# Patient Record
Sex: Female | Born: 1950
Health system: Southern US, Community
[De-identification: ages and names within clinical notes are randomized; demographics above are authoritative.]

## PROBLEM LIST (undated history)

## (undated) DIAGNOSIS — E039 Hypothyroidism, unspecified: Secondary | ICD-10-CM

## (undated) DIAGNOSIS — J45909 Unspecified asthma, uncomplicated: Secondary | ICD-10-CM

## (undated) DIAGNOSIS — M1990 Unspecified osteoarthritis, unspecified site: Secondary | ICD-10-CM

## (undated) DIAGNOSIS — E079 Disorder of thyroid, unspecified: Secondary | ICD-10-CM

## (undated) DIAGNOSIS — K922 Gastrointestinal hemorrhage, unspecified: Secondary | ICD-10-CM

## (undated) DIAGNOSIS — R011 Cardiac murmur, unspecified: Secondary | ICD-10-CM

## (undated) DIAGNOSIS — T4145XA Adverse effect of unspecified anesthetic, initial encounter: Secondary | ICD-10-CM

## (undated) DIAGNOSIS — G43909 Migraine, unspecified, not intractable, without status migrainosus: Secondary | ICD-10-CM

## (undated) DIAGNOSIS — F1010 Alcohol abuse, uncomplicated: Secondary | ICD-10-CM

## (undated) DIAGNOSIS — D649 Anemia, unspecified: Secondary | ICD-10-CM

## (undated) DIAGNOSIS — I499 Cardiac arrhythmia, unspecified: Secondary | ICD-10-CM

## (undated) DIAGNOSIS — F32A Depression, unspecified: Secondary | ICD-10-CM

## (undated) DIAGNOSIS — F329 Major depressive disorder, single episode, unspecified: Secondary | ICD-10-CM

## (undated) DIAGNOSIS — T8859XA Other complications of anesthesia, initial encounter: Secondary | ICD-10-CM

## (undated) DIAGNOSIS — I2510 Atherosclerotic heart disease of native coronary artery without angina pectoris: Secondary | ICD-10-CM

## (undated) DIAGNOSIS — I10 Essential (primary) hypertension: Secondary | ICD-10-CM

## (undated) DIAGNOSIS — I209 Angina pectoris, unspecified: Secondary | ICD-10-CM

## (undated) DIAGNOSIS — F509 Eating disorder, unspecified: Secondary | ICD-10-CM

## (undated) HISTORY — PX: NECK SURGERY: SHX720

## (undated) HISTORY — PX: EYE SURGERY: SHX253

## (undated) HISTORY — DX: Gastrointestinal hemorrhage, unspecified: K92.2

## (undated) HISTORY — DX: Cardiac murmur, unspecified: R01.1

## (undated) HISTORY — DX: Alcohol abuse, uncomplicated: F10.10

## (undated) HISTORY — DX: Eating disorder, unspecified: F50.9

## (undated) HISTORY — PX: OTHER SURGICAL HISTORY: SHX169

## (undated) HISTORY — PX: SHOULDER SURGERY: SHX246

## (undated) HISTORY — PX: LEG SURGERY: SHX1003

## (undated) HISTORY — DX: Migraine, unspecified, not intractable, without status migrainosus: G43.909

---
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
## 2014-03-14 HISTORY — PX: BREAST SURGERY: SHX581

## 2017-09-05 ENCOUNTER — Emergency Department (HOSPITAL_BASED_OUTPATIENT_CLINIC_OR_DEPARTMENT_OTHER): Payer: Medicare Other

## 2017-09-05 ENCOUNTER — Emergency Department (HOSPITAL_COMMUNITY): Payer: Medicare Other

## 2017-09-05 ENCOUNTER — Emergency Department (HOSPITAL_COMMUNITY)
Admission: EM | Admit: 2017-09-05 | Discharge: 2017-09-05 | Disposition: A | Discharge status: Home / Self Care | Payer: Medicare Other | Attending: Emergency Medicine | Admitting: Emergency Medicine

## 2017-09-05 ENCOUNTER — Encounter (HOSPITAL_COMMUNITY): Payer: Self-pay

## 2017-09-05 DIAGNOSIS — M25572 Pain in left ankle and joints of left foot: Secondary | ICD-10-CM | POA: Diagnosis not present

## 2017-09-05 DIAGNOSIS — R609 Edema, unspecified: Secondary | ICD-10-CM | POA: Diagnosis not present

## 2017-09-05 DIAGNOSIS — E079 Disorder of thyroid, unspecified: Secondary | ICD-10-CM | POA: Insufficient documentation

## 2017-09-05 DIAGNOSIS — M79605 Pain in left leg: Secondary | ICD-10-CM | POA: Diagnosis present

## 2017-09-05 DIAGNOSIS — I10 Essential (primary) hypertension: Secondary | ICD-10-CM | POA: Insufficient documentation

## 2017-09-05 HISTORY — DX: Major depressive disorder, single episode, unspecified: F32.9

## 2017-09-05 HISTORY — DX: Essential (primary) hypertension: I10

## 2017-09-05 HISTORY — DX: Anemia, unspecified: D64.9

## 2017-09-05 HISTORY — DX: Depression, unspecified: F32.A

## 2017-09-05 HISTORY — DX: Disorder of thyroid, unspecified: E07.9

## 2017-09-05 MED ORDER — NAPROXEN 375 MG PO TABS: 375.0000 mg | ORAL_TABLET | Freq: Two times a day (BID) | ORAL | 0 refills | Status: DC

## 2017-09-05 NOTE — Discharge Instructions (Addendum)
You are seen in the emergency department today for pain and swelling to your left ankle.  The ultrasound we performed did not show a blood clot.  The x-rays were performed did not show a new broken bones or any other specific concerning findings.  At this time we suspect this may be an ankle sprain/strain, but are not 100% sure.  We are going to place you into an ankle brace.  Please wear this as needed for pain and swelling.  If your toes become numb, tingly, or blue, please remove the splint and return to the ER.  Apply ice wrapped in a towel 20 minutes on 40 minutes off for the next 48 hours.  Keep the leg elevated when seated.  We are also sending you home with naproxen for pain and swelling. Naproxen is a nonsteroidal anti-inflammatory medication that will help with pain and swelling. Be sure to take this medication as prescribed with food, 1 pill every 12 hours,  It should be taken with food, as it can cause stomach upset, and more seriously, stomach bleeding. Do not take other nonsteroidal anti-inflammatory medications with this such as Advil, Motrin, or Aleve.   We have prescribed you new medication(s) today. Discuss the medications prescribed today with your pharmacist as they can have adverse effects and interactions with your other medicines including over the counter and prescribed medications. Seek medical evaluation if you start to experience new or abnormal symptoms after taking one of these medicines, seek care immediately if you start to experience difficulty breathing, feeling of your throat closing, facial swelling, or rash as these could be indications of a more serious allergic reaction  You may take Tylenol with this medication safely.  Follow-up with the orthopedic doctor providing your discharge instructions within 1 week.  Return to the ER anytime for new or worsening symptoms including but not limited to fever, redness around the area, inability to walk, or any other concerns.

## 2017-09-05 NOTE — ED Provider Notes (Signed)
Patient placed in Quick Look pathway, seen and evaluated   Chief Complaint: Left leg pain  HPI:   Patient reports that she had a mechanical slip about 2 weeks ago at work and since then have had left leg pain and worsening swelling.  She has a history of crush injury to this leg with fasciotomy back in 2008.  In 2013 had foot reconstruction.   ROS: No fevers (one)  Physical Exam:   Gen: No distress  Neuro: Awake and Alert  Skin: Warm    Focused Exam: Left leg has swelling around ankle.  Is warm to touch.    Initiation of care has begun. The patient has been counseled on the process, plan, and necessity for staying for the completion/evaluation, and the remainder of the medical screening examination    Ollen Gross 09/05/17 1649    Hayden Rasmussen, MD 09/06/17 [phone]

## 2017-09-05 NOTE — ED Notes (Signed)
Ortho tech at bedside 

## 2017-09-05 NOTE — ED Notes (Signed)
Pt offered wheel chair for way out, pt declined

## 2017-09-05 NOTE — ED Triage Notes (Signed)
Pt presents with pain and swelling to L leg; reports slipping injury 2-3 weeks ago; pt reports L leg is warmer to touch than R leg.

## 2017-09-05 NOTE — Progress Notes (Signed)
Left lower extremity venous duplex has been completed. Negative for DVT.  Results were given to [facility] PA.  09/05/17 5:59 PM Carlos Levering RVT

## 2017-09-05 NOTE — ED Provider Notes (Signed)
[facility] EMERGENCY DEPARTMENT Provider Note   CSN: [id number] Arrival date & time: 09/05/17  1627     History   Chief Complaint Chief Complaint  Patient presents with  . Leg Pain    HPI Heather Christensen is a 67 y.o. female with a hx of HTN, depression, anemia, LLE crush injury with fasciotomy in 2008 and subsequent osteomyelitis and foot reconstruction in 2013 who presents to the ED with complaints of left lower extremity pain and swelling for the past 2.5 weeks.  Patient states that she was ambulating when she had a mechanical slip forward on the left foot.  She did not fall to the ground.  No head injury or loss of consciousness.  She states that shortly after this she developed pain and swelling into the left ankle and anterior lower leg.  She states she feels like swelling is worsening  She has been taking Aleve without significant change in her symptoms.   She states it feels somewhat warm to touch in these areas.  Given her extensive history she wanted to be evaluated, she states that she tried getting appointment with orthopedic doctor in the area as she just moved from [place], however they were not able to see her until next week.  Denies fever, chills, nausea, vomiting, or overlying redness.  She does have neurologic deficits secondary to her previous injuries which are unchanged.  HPI  Past Medical History:  Diagnosis Date  . Anemia   . Depression   . Hypertension   . Thyroid disease     There are no active problems to display for this patient.   Past Surgical History:  Procedure Laterality Date  . BRAIN SURGERY    . EYE SURGERY    . leg surgeries       OB History   None      Home Medications    Prior to Admission medications   Not on File    Family History History reviewed. No pertinent family history.  Social History Social History   Tobacco Use  . Smoking status: Never Smoker  . Smokeless tobacco: Never Used    Substance Use Topics  . Alcohol use: Not on file  . Drug use: Not on file     Allergies   Codeine and Sulfa antibiotics   Review of Systems Review of Systems  Constitutional: Negative for appetite change, chills and fever.  Cardiovascular: Positive for leg swelling.  Musculoskeletal: Positive for arthralgias (L ankle/lower leg pain).  Neurological:       Patient with decreased sensation to the lower extremity which is baseline, unchanged.  All other systems reviewed and are negative.  Physical Exam Updated Vital Signs BP (!) 152/100 (BP Location: Right Arm)   Pulse 76   Temp 98.9 F (37.2 C) (Oral)   Resp 18   Ht 5\' 1"  (1.549 m)   Wt 90.7 kg (200 lb)   SpO2 99%   BMI 37.79 kg/m   Physical Exam  Constitutional: She appears well-developed and well-nourished. No distress.  HENT:  Head: Normocephalic and atraumatic.  Eyes: Conjunctivae are normal. Right eye exhibits no discharge. Left eye exhibits no discharge.  Cardiovascular: Normal rate and regular rhythm.  Pulses:      Dorsalis pedis pulses are 2+ on the right side, and 2+ on the left side.       Posterior tibial pulses are 2+ on the right side, and 2+ on the left side.  Pulmonary/Chest: Effort normal  and breath sounds normal. No respiratory distress.  Musculoskeletal:  Patient with surgical changes to the left lower extremity as pictured below.  Patient does have 1+ pitting edema to the L foot and ankle extending to the distal aspects of the lower leg anteriorly, does not extend to the upper portion of the lower leg or the knee. No appreciable/significant warmth.  No overlying erythema.  No new open wounds.  Patient has normal range of motion to bilateral knees and ankles.  She is able to move all digits.  She is somewhat tender to palpation diffusely to the medial and lateral ankle as well as the anterior distal lower leg.  No point/focal areas of tenderness.  Compartments are soft.  Neurological: She is alert.   Sensation grossly intact bilateral lower extremities.  Patient has 5 out of 5 strength with plantar dorsiflexion bilaterally.  Gait intact.Clear speech.   Psychiatric: She has a normal mood and affect. Her behavior is normal. Thought content normal.  Nursing note and vitals reviewed.        ED Treatments / Results  Labs (all labs ordered are listed, but only abnormal results are displayed) Labs Reviewed - No data to display  EKG None  Radiology Dg Tibia/fibula Left  Result Date: 09/05/2017 CLINICAL DATA:  LEFT LOWER leg pain following injury 3 weeks ago. Initial encounter. History of remote tibial and fibular fractures. EXAM: LEFT TIBIA AND FIBULA - 2 VIEW COMPARISON:  None. FINDINGS: No acute fracture, subluxation or dislocation identified. Chronic posttraumatic changes of the proximal tibia and fibula noted. Degenerative changes within the MEDIAL and LATERAL compartments noted. No knee effusion. IMPRESSION: No acute bony abnormality Remote fractures of the proximal tibia and fibula. Electronically Signed   By: Margarette Canada M.D.   On: 09/05/2017 18:10   Dg Foot Complete Left  Result Date: 09/05/2017 CLINICAL DATA:  Acute LEFT foot pain following injury today. Initial encounter. EXAM: LEFT FOOT - COMPLETE 3+ VIEW COMPARISON:  None. FINDINGS: No acute fracture, subluxation or dislocation identified. Metallic screw and bony fusion across the big toe and tear phalangeal joint noted. A stable within the 1st metatarsal noted. Remote fractures of the 5th metatarsal and little toe proximal phalanx noted. The Lisfranc joints are unremarkable. Dorsal soft tissue swelling is present. IMPRESSION: Soft tissue swelling without acute bony abnormality. Electronically Signed   By: Margarette Canada M.D.   On: 09/05/2017 19:46    Procedures Procedures   (including critical care time)  Medications Ordered in ED Medications - No data to display   Initial Impression / Assessment and Plan / ED Course  I  have reviewed the triage vital signs and the nursing notes.  Pertinent labs & imaging results that were available during my care of the patient were reviewed by me and considered in my medical decision making (see chart for details).   Patient presents with left ankle/distal lower leg pain/swelling status post mechanical slip with this foot 2 weeks ago.  Patient nontoxic-appearing, no apparent distress, vitals WNL with the exception of elevated blood pressure, doubt HTN emergency, patient aware of need for recheck. On exam patient does have some edema and tenderness about the ankle and the anterior distal lower leg. No appreciable overlying erythema/warmth, patient afebrile, doubt infectious etiology such as cellulitis, septic joint, or osteomyelitis. DVT study per triage negative. X-rays obtained negative for acute fracture/dislocation. Possible ankle sprain given mechanism. NVI distally. Will place patient in ASO and provide naproxen with orthopedics follow up. I discussed results,  treatment plan, need for follow-up, and return precautions with the patient. Provided opportunity for questions, patient confirmed understanding and is in agreement with plan.   Findings and plan of care discussed with supervising physician Dr. Wilson Singer who is in agreement with plan.   Final Clinical Impressions(s) / ED Diagnoses   Final diagnoses:  Acute left ankle pain    ED Discharge Orders        Ordered    naproxen (NAPROSYN) 375 MG tablet  2 times daily     09/05/17 2026       Amaryllis Dyke, PA-C 09/05/17 2145    Virgel Manifold, MD 09/06/17 1248

## 2017-09-05 NOTE — ED Notes (Signed)
Ortho tech paged  

## 2017-09-05 NOTE — ED Notes (Signed)
ED Provider at bedside. 

## 2017-09-06 NOTE — Progress Notes (Signed)
Orthopedic Tech Progress Note Patient Details:  Heather Christensen [birth year] [id number]  Ortho Devices Type of Ortho Device: ASO Ortho Device/Splint Location: lle Ortho Device/Splint Interventions: Ordered, Application, Adjustment   Post Interventions Patient Tolerated: Well Instructions Provided: Care of device, Adjustment of device   [name] 09/06/2017, 12:39 AM

## 2017-10-31 NOTE — Pre-Procedure Instructions (Signed)
Heather Christensen [facility]  10/31/2017      [id number] [address] [place] [address] Phone: [phone] Fax: [phone]    Your procedure is scheduled on August 29  Report to [place] at [place].  Call this number if you have problems the morning of surgery:  [phone]   Remember:  Do not eat or drink after midnight.    Take these medicines the morning of surgery with A SIP OF WATER  Fluticasone-Salmeterol (ADVAIR levothyroxine (SYNTHROID, Levothroid loratadine (CLARITIN)  7 days prior to surgery STOP taking any Aspirin(unless otherwise instructed by your surgeon), Aleve, Naproxen, Ibuprofen, Motrin, Advil, Goody's, BC's, all herbal medications, fish oil, and all vitamins  Follow your surgeon's instructions on when to stop Asprin.  If no instructions were given by your surgeon then you will need to call the office to get those instructions.       Do not wear jewelry, make-up or nail polish.  Do not wear lotions, powders, or perfumes, or deodorant.  Do not shave 48 hours prior to surgery.    Do not bring valuables to the hospital.  [facility] is not responsible for any belongings or valuables.  Contacts, dentures or bridgework may not be worn into surgery.  Leave your suitcase in the car.  After surgery it may be brought to your room.  For patients admitted to the hospital, discharge time will be determined by your treatment team.  Patients discharged the day of surgery will not be allowed to drive home.    Special instructions:   [facility]- Preparing For Surgery  Before surgery, you can play an important role. Because skin is not sterile, your skin needs to be as free of germs as possible. You can reduce the number of germs on your skin by washing with CHG (chlorahexidine gluconate) Soap before surgery.  CHG is an antiseptic cleaner which kills germs and bonds with the skin to continue killing  germs even after washing.    Oral Hygiene is also important to reduce your risk of infection.  Remember - BRUSH YOUR TEETH THE MORNING OF SURGERY WITH YOUR REGULAR TOOTHPASTE  Please do not use if you have an allergy to CHG or antibacterial soaps. If your skin becomes reddened/irritated stop using the CHG.  Do not shave (including legs and underarms) for at least 48 hours prior to first CHG shower. It is OK to shave your face.  Please follow these instructions carefully.   1. Shower the NIGHT BEFORE SURGERY and the MORNING OF SURGERY with CHG.   2. If you chose to wash your hair, wash your hair first as usual with your normal shampoo.  3. After you shampoo, rinse your hair and body thoroughly to remove the shampoo.  4. Use CHG as you would any other liquid soap. You can apply CHG directly to the skin and wash gently with a scrungie or a clean washcloth.   5. Apply the CHG Soap to your body ONLY FROM THE NECK DOWN.  Do not use on open wounds or open sores. Avoid contact with your eyes, ears, mouth and genitals (private parts). Wash Face and genitals (private parts)  with your normal soap.  6. Wash thoroughly, paying special attention to the area where your surgery will be performed.  7. Thoroughly rinse your body with warm water from the neck down.  8. DO NOT shower/wash with your normal soap after using  and rinsing off the CHG Soap.  9. Pat yourself dry with a CLEAN TOWEL.  10. Wear CLEAN PAJAMAS to bed the night before surgery, wear comfortable clothes the morning of surgery  11. Place CLEAN SHEETS on your bed the night of your first shower and DO NOT SLEEP WITH PETS.    Day of Surgery:  Do not apply any deodorants/lotions.  Please wear clean clothes to the hospital/surgery center.   Remember to brush your teeth WITH YOUR REGULAR TOOTHPASTE.    Please read over the following fact sheets that you were given.

## 2017-11-01 ENCOUNTER — Encounter (HOSPITAL_COMMUNITY): Payer: Self-pay

## 2017-11-01 ENCOUNTER — Encounter (HOSPITAL_COMMUNITY)
Admission: RE | Admit: 2017-11-01 | Discharge: 2017-11-01 | Disposition: A | Discharge status: Home / Self Care | Payer: Medicare Other | Source: Ambulatory Visit | Attending: Orthopedic Surgery | Admitting: Orthopedic Surgery

## 2017-11-01 ENCOUNTER — Other Ambulatory Visit: Payer: Self-pay

## 2017-11-01 DIAGNOSIS — E039 Hypothyroidism, unspecified: Secondary | ICD-10-CM | POA: Diagnosis not present

## 2017-11-01 DIAGNOSIS — I10 Essential (primary) hypertension: Secondary | ICD-10-CM | POA: Diagnosis not present

## 2017-11-01 DIAGNOSIS — M13812 Other specified arthritis, left shoulder: Secondary | ICD-10-CM | POA: Insufficient documentation

## 2017-11-01 DIAGNOSIS — Z01812 Encounter for preprocedural laboratory examination: Secondary | ICD-10-CM | POA: Insufficient documentation

## 2017-11-01 DIAGNOSIS — Z79899 Other long term (current) drug therapy: Secondary | ICD-10-CM | POA: Diagnosis not present

## 2017-11-01 DIAGNOSIS — Z7982 Long term (current) use of aspirin: Secondary | ICD-10-CM | POA: Diagnosis not present

## 2017-11-01 DIAGNOSIS — Z79890 Hormone replacement therapy (postmenopausal): Secondary | ICD-10-CM | POA: Insufficient documentation

## 2017-11-01 HISTORY — DX: Adverse effect of unspecified anesthetic, initial encounter: T41.45XA

## 2017-11-01 HISTORY — DX: Unspecified asthma, uncomplicated: J45.909

## 2017-11-01 HISTORY — DX: Hypothyroidism, unspecified: E03.9

## 2017-11-01 HISTORY — DX: Other complications of anesthesia, initial encounter: T88.59XA

## 2017-11-01 HISTORY — DX: Unspecified osteoarthritis, unspecified site: M19.90

## 2017-11-01 HISTORY — DX: Cardiac arrhythmia, unspecified: I49.9

## 2017-11-01 LAB — CBC
HCT: 44.1 % (ref 36.0–46.0)
Hemoglobin: 14 g/dL (ref 12.0–15.0)
MCH: 30.8 pg (ref 26.0–34.0)
MCHC: 31.7 g/dL (ref 30.0–36.0)
MCV: 97.1 fL (ref 78.0–100.0)
PLATELETS: 250 10*3/uL (ref 150–400)
RBC: 4.54 MIL/uL (ref 3.87–5.11)
RDW: 12.6 % (ref 11.5–15.5)
WBC: 6 10*3/uL (ref 4.0–10.5)

## 2017-11-01 LAB — BASIC METABOLIC PANEL
Anion gap: 8 (ref 5–15)
BUN: 14 mg/dL (ref 8–23)
CALCIUM: 9.3 mg/dL (ref 8.9–10.3)
CHLORIDE: 106 mmol/L (ref 98–111)
CO2: 29 mmol/L (ref 22–32)
CREATININE: 1.08 mg/dL — AB (ref 0.44–1.00)
GFR calc non Af Amer: 52 mL/min — ABNORMAL LOW (ref >60)
GLUCOSE: 102 mg/dL — AB (ref 70–99)
Potassium: 4.7 mmol/L (ref 3.5–5.1)
Sodium: 143 mmol/L (ref 135–145)

## 2017-11-01 LAB — SURGICAL PCR SCREEN
MRSA, PCR: NEGATIVE
Staphylococcus aureus: NEGATIVE

## 2017-11-01 NOTE — Progress Notes (Signed)
PCP - Everardo Beals Cardiologist - denies  Chest x-ray - not needed EKG - requesting Stress Test - 2019 in Care everywhere ECHO - > 5 years Cardiac Cath - denies    Aspirin Instructions: stop 5 days  Anesthesia review: yes requesting cardiac records, hx PVC, SVT (hospital admission 03/2017)  Patient denies shortness of breath, fever, cough and chest pain at PAT appointment   Patient verbalized understanding of instructions that were given to them at the PAT appointment. Patient was also instructed that they will need to review over the PAT instructions again at home before surgery.

## 2017-11-02 ENCOUNTER — Encounter (HOSPITAL_COMMUNITY): Payer: Self-pay

## 2017-11-02 NOTE — Progress Notes (Addendum)
Anesthesia Chart Review:  Case:  [id number] Date/Time:  11/09/17 0715   Procedure:  REVERSE LEFT SHOULDER ARTHROPLASTY (Left Shoulder)   Anesthesia type:  General   Pre-op diagnosis:  Left shoulder arthropathy   Location:  [facility] OR ROOM 06 / [place] OR   Surgeon:  Justice Britain, MD      DISCUSSION: Patient is a 67 year old female scheduled for the above procedure.   History includes never smoker, HTN, dysrhythmia (palpitations, SVT, PVCs), pernicious anemia, asthma, hypothyroidism. BMI is consistent with obesity/borderline morbid obesity. She is a Actor. She recently moved to [place] from [place], [place].  She was evaluated by cardiology during 03/2017 hospitalization at [facility] for palpitations and recurrent chest pain and known remote history of SVT. EKG showed SR with PACs and blocked PACs. Troponins were negative. Stress test was non-ischemic. She was last seen by Dwana Curd, PA/Eduardo Lysle Morales, MD in [place], [place] on 04/20/17 for hospital follow-up (see [place]). Ziopatch ordered which showed 1% afib with 34 minute episode and multiple runs of SVT. I do not see any additional cardiology follow-up or recommendations after the [place].   Reviewed above with anesthesiologist Roberts Gaudy, MD. Based on Ziopatch results, patient felt to be at increased risk for perioperative afib. She is on ASA 81 mg. She is not on a b-blocker (HR 66 bpm at PAT) or anti-arrhythmic type medication. Would recommend preoperative cardiology clearance/recommendations. I spoke with patient and Claiborne Billings at Dr. Augustin Coupe' office. Patient denied any significant palpitations since ~ 04/2017. She was going through a stressful family situation at that time. She and Claiborne Billings will work on getting cardiology input. Chart will be left for follow-up.  ADDENDUM 11/07/17 12:35 PM: Patient was seen by Jeri Lager, NP at [facility] on 11/06/17. She apparently did not have  study results from [facility] at that time, so I called and reviewed results with Jeri Lager. (She has since received the ZioPatch results from [facility].) I also notified her that to my knowledge, patient did not have any echo at the time of her January 2019 evaluation. She reviewed with cardiologist Vernell Leep, MD. They felt patient was acceptable risk for surgery. No plans to start b-blocker at this time (HR 60's). If she develops perioperative arrhythmia, then b-blocker can be considered then as well as additional cardiology input. They will call patient to have her seen for out-patient follow-up after her surgery and will discuss consideration of anticoagulation therapy with her given Holter results.   If no acute changes then I anticipate that she can proceed as planned.   VS: BP (!) 166/61   Pulse 66   Temp 36.8 C   Resp 20   Ht 5\' 1"  (1.549 m)   Wt 95.9 kg   SpO2 96%   BMI 39.94 kg/m   PROVIDERS: Patient, No Pcp Per. She sees Everardo Beals, NP at [facility].   LABS: Labs reviewed: Acceptable for surgery. (all labs ordered are listed, but only abnormal results are displayed)  Labs Reviewed  BASIC METABOLIC PANEL - Abnormal; Notable for the following components:      Result Value   Glucose, Bld 102 (*)    Creatinine, Ser 1.08 (*)    GFR calc non Af Amer 52 (*)    All other components within normal limits  SURGICAL PCR SCREEN  CBC    IMAGES: PCXR 03/30/17 ([place]): FINDINGS  Portable AP view obtained. The soft and bony  tissues are unremarkable .  The heart and mediastinum are unremarkable .  No pulmonary pathology can be seen and specifically no evidence of infiltrates, adenopathy, effusions, or other acute cardiopulmonary abnormality is noted .  IMPRESSION 1. No significant radiographic pathology is evident . No change from prior study.   EKG: - 11/06/17 [facility]): NSR.  - 04/03/17 [facility]): Requested.  Per Care Everywhere Result Narrative: Normal sinus rhythm Normal ECG When compared with ECG of 30-Mar-2017 14:39, aberrant conduction is no longer present Confirmed by [place] No [phone]) on 04/03/2017 2:58:08 PM   CV: Ziopatch 03/31/17-04/10/17 [facility]): Final Interpretation: 1% atrial fibrillation with a 34-minute episode. Multiple short runs of SVT. Over 200 patient triggered episodes-most recorded normal sinus rhythm with premature beats-if you occurred with SVT or PAF.  Nuclear stress test 03/31/17 ([place]): PERFUSION: SPECT images demonstrate homogeneous tracer distribution throughout the myocardium. With no evidence of ischemia. No scintigraphic evidence of scar. FUNCTIONAL RESULTS (calculated via Gated SPECT) Stress Image LV EF: 88 %   Past Medical History:  Diagnosis Date  . Anemia    pernicious anemia  . Arthritis   . Asthma    seasonal, advair inhaler  . Complication of anesthesia    slow to wake  . Depression   . Dysrhythmia    palpitations, PVCs, SVT  . Hypertension   . Hypothyroidism   . Thyroid disease     Past Surgical History:  Procedure Laterality Date  . BREAST SURGERY Right    lumpectomy - begin  . EYE SURGERY     cataracts  . leg surgeries     fell leg got caught in a step stool and the toilet in the bathroom and crushed leg  . NECK SURGERY     lypoma removed from left side up behind ear (size of hand) beign  . SHOULDER SURGERY Left    rotator cuff, ball broke   . TONSILLECTOMY      MEDICATIONS: . aspirin EC 81 MG tablet  . atorvastatin (LIPITOR) 10 MG tablet  . citalopram (CELEXA) 40 MG tablet  . Fluticasone-Salmeterol (ADVAIR) 250-50 MCG/DOSE AEPB  . gabapentin (NEURONTIN) 600 MG tablet  . levothyroxine (SYNTHROID, LEVOTHROID) 75 MCG tablet  . loratadine (CLARITIN) 10 MG tablet  . losartan (COZAAR) 100 MG tablet  . Magnesium 500 MG TABS  . MELATONIN PO  . Menthol, Topical Analgesic, (BIOFREEZE EX)  . Multiple  Vitamin (MULTIVITAMIN WITH MINERALS) TABS tablet  . naproxen (NAPROSYN) 375 MG tablet  . naproxen sodium (ALEVE) 220 MG tablet  . potassium gluconate (HM POTASSIUM) 595 (99 K) MG TABS tablet  . TURMERIC PO   No current facility-administered medications for this encounter.     George Hugh [facility] Stay Center/Anesthesiology Phone [phone] 11/02/2017 5:24 PM

## 2017-11-08 MED ORDER — TRANEXAMIC ACID 1000 MG/10ML IV SOLN
1000.0000 mg | INTRAVENOUS | Status: AC
  Administered 2017-11-09: 1000 mg via INTRAVENOUS
  Filled 2017-11-08: qty 1100

## 2017-11-08 NOTE — Anesthesia Preprocedure Evaluation (Addendum)
Anesthesia Evaluation  Patient identified by MRN, date of birth, ID band Patient awake    Reviewed: Allergy & Precautions, NPO status , Patient's Chart, lab work & pertinent test results  Airway Mallampati: I  TM Distance: >3 FB Neck ROM: Full    Dental  (+) Teeth Intact, Dental Advisory Given   Pulmonary asthma ,    breath sounds clear to auscultation       Cardiovascular hypertension, Pt. on medications + dysrhythmias Atrial Fibrillation  Rhythm:Regular Rate:Normal     Neuro/Psych negative neurological ROS     GI/Hepatic negative GI ROS, Neg liver ROS,   Endo/Other  Hypothyroidism   Renal/GU negative Renal ROS     Musculoskeletal   Abdominal   Peds  Hematology negative hematology ROS (+)   Anesthesia Other Findings   Reproductive/Obstetrics                           Lab Results  Component Value Date   WBC 6.0 11/01/2017   HGB 14.0 11/01/2017   HCT 44.1 11/01/2017   MCV 97.1 11/01/2017   PLT 250 11/01/2017   Lab Results  Component Value Date   CREATININE 1.08 (H) 11/01/2017   BUN 14 11/01/2017   NA 143 11/01/2017   K 4.7 11/01/2017   CL 106 11/01/2017   CO2 29 11/01/2017    Anesthesia Physical Anesthesia Plan  ASA: II  Anesthesia Plan: General   Post-op Pain Management:  Regional for Post-op pain   Induction: Intravenous  PONV Risk Score and Plan: 3 and Ondansetron, Dexamethasone and Treatment may vary due to age or medical condition  Airway Management Planned: Oral ETT  Additional Equipment:   Intra-op Plan:   Post-operative Plan: Extubation in OR  Informed Consent: I have reviewed the patients History and Physical, chart, labs and discussed the procedure including the risks, benefits and alternatives for the proposed anesthesia with the patient or authorized representative who has indicated his/her understanding and acceptance.   Dental advisory given  Plan  Discussed with: CRNA  Anesthesia Plan Comments:        Anesthesia Quick Evaluation

## 2017-11-09 ENCOUNTER — Encounter (HOSPITAL_COMMUNITY): Payer: Self-pay | Admitting: *Deleted

## 2017-11-09 ENCOUNTER — Inpatient Hospital Stay (HOSPITAL_COMMUNITY)
Admission: RE | Admit: 2017-11-09 | Discharge: 2017-11-10 | DRG: 483 | Disposition: A | Discharge status: Home / Self Care | Payer: Medicare Other | Attending: Orthopedic Surgery | Admitting: Orthopedic Surgery

## 2017-11-09 ENCOUNTER — Inpatient Hospital Stay (HOSPITAL_COMMUNITY): Payer: Medicare Other | Admitting: Vascular Surgery

## 2017-11-09 ENCOUNTER — Inpatient Hospital Stay (HOSPITAL_COMMUNITY): Payer: Medicare Other | Admitting: Anesthesiology

## 2017-11-09 ENCOUNTER — Other Ambulatory Visit: Payer: Self-pay

## 2017-11-09 ENCOUNTER — Encounter (HOSPITAL_COMMUNITY)
Admission: RE | Disposition: A | Discharge status: Home / Self Care | Payer: Self-pay | Source: Home / Self Care | Attending: Orthopedic Surgery

## 2017-11-09 DIAGNOSIS — J45909 Unspecified asthma, uncomplicated: Secondary | ICD-10-CM | POA: Diagnosis present

## 2017-11-09 DIAGNOSIS — Z79899 Other long term (current) drug therapy: Secondary | ICD-10-CM

## 2017-11-09 DIAGNOSIS — M19112 Post-traumatic osteoarthritis, left shoulder: Principal | ICD-10-CM | POA: Diagnosis present

## 2017-11-09 DIAGNOSIS — F329 Major depressive disorder, single episode, unspecified: Secondary | ICD-10-CM | POA: Diagnosis present

## 2017-11-09 DIAGNOSIS — S42292S Other displaced fracture of upper end of left humerus, sequela: Secondary | ICD-10-CM

## 2017-11-09 DIAGNOSIS — Z79890 Hormone replacement therapy (postmenopausal): Secondary | ICD-10-CM

## 2017-11-09 DIAGNOSIS — I10 Essential (primary) hypertension: Secondary | ICD-10-CM | POA: Diagnosis present

## 2017-11-09 DIAGNOSIS — M25512 Pain in left shoulder: Secondary | ICD-10-CM | POA: Diagnosis present

## 2017-11-09 DIAGNOSIS — E039 Hypothyroidism, unspecified: Secondary | ICD-10-CM | POA: Diagnosis present

## 2017-11-09 DIAGNOSIS — Z96619 Presence of unspecified artificial shoulder joint: Secondary | ICD-10-CM

## 2017-11-09 DIAGNOSIS — Z7982 Long term (current) use of aspirin: Secondary | ICD-10-CM | POA: Diagnosis not present

## 2017-11-09 HISTORY — PX: REVERSE SHOULDER ARTHROPLASTY: SHX5054

## 2017-11-09 LAB — GLUCOSE, CAPILLARY: GLUCOSE-CAPILLARY: 196 mg/dL — AB (ref 70–99)

## 2017-11-09 SURGERY — ARTHROPLASTY, SHOULDER, TOTAL, REVERSE
Anesthesia: General | Site: Shoulder | Laterality: Left

## 2017-11-09 MED ORDER — ONDANSETRON HCL 4 MG PO TABS: 4.0000 mg | ORAL_TABLET | Freq: Four times a day (QID) | ORAL | Status: DC | PRN

## 2017-11-09 MED ORDER — DEXAMETHASONE SODIUM PHOSPHATE 10 MG/ML IJ SOLN
INTRAMUSCULAR | Status: AC
  Filled 2017-11-09: qty 1

## 2017-11-09 MED ORDER — ONDANSETRON HCL 4 MG/2ML IJ SOLN
INTRAMUSCULAR | Status: DC | PRN
  Administered 2017-11-09: 4 mg via INTRAVENOUS

## 2017-11-09 MED ORDER — SUGAMMADEX SODIUM 200 MG/2ML IV SOLN
INTRAVENOUS | Status: DC | PRN
  Administered 2017-11-09: 200 mg via INTRAVENOUS

## 2017-11-09 MED ORDER — KETOROLAC TROMETHAMINE 15 MG/ML IJ SOLN
7.5000 mg | Freq: Four times a day (QID) | INTRAMUSCULAR | Status: AC
  Administered 2017-11-09 – 2017-11-10 (×4): 7.5 mg via INTRAVENOUS
  Filled 2017-11-09 (×4): qty 1

## 2017-11-09 MED ORDER — MIDAZOLAM HCL 5 MG/5ML IJ SOLN
INTRAMUSCULAR | Status: DC | PRN
  Administered 2017-11-09: 1 mg via INTRAVENOUS

## 2017-11-09 MED ORDER — LEVOTHYROXINE SODIUM 75 MCG PO TABS
75.0000 ug | ORAL_TABLET | Freq: Every day | ORAL | Status: DC
  Administered 2017-11-10: 75 ug via ORAL
  Filled 2017-11-09: qty 1

## 2017-11-09 MED ORDER — FENTANYL CITRATE (PF) 100 MCG/2ML IJ SOLN: 25.0000 ug | INTRAMUSCULAR | Status: DC | PRN

## 2017-11-09 MED ORDER — PROPOFOL 10 MG/ML IV BOLUS
INTRAVENOUS | Status: AC
  Filled 2017-11-09: qty 20

## 2017-11-09 MED ORDER — CEFAZOLIN SODIUM-DEXTROSE 2-4 GM/100ML-% IV SOLN
2.0000 g | INTRAVENOUS | Status: AC
  Administered 2017-11-09: 2 g via INTRAVENOUS
  Filled 2017-11-09: qty 100

## 2017-11-09 MED ORDER — MIDAZOLAM HCL 2 MG/2ML IJ SOLN
INTRAMUSCULAR | Status: AC
  Filled 2017-11-09: qty 2

## 2017-11-09 MED ORDER — ROCURONIUM BROMIDE 50 MG/5ML IV SOSY
PREFILLED_SYRINGE | INTRAVENOUS | Status: DC | PRN
  Administered 2017-11-09: 50 mg via INTRAVENOUS

## 2017-11-09 MED ORDER — ASPIRIN EC 81 MG PO TBEC
81.0000 mg | DELAYED_RELEASE_TABLET | Freq: Two times a day (BID) | ORAL | Status: DC
  Administered 2017-11-09 – 2017-11-10 (×3): 81 mg via ORAL
  Filled 2017-11-09 (×3): qty 1

## 2017-11-09 MED ORDER — POLYETHYLENE GLYCOL 3350 17 G PO PACK: 17.0000 g | PACK | Freq: Every day | ORAL | Status: DC | PRN

## 2017-11-09 MED ORDER — MOMETASONE FURO-FORMOTEROL FUM 200-5 MCG/ACT IN AERO
2.0000 | INHALATION_SPRAY | Freq: Two times a day (BID) | RESPIRATORY_TRACT | Status: DC
  Administered 2017-11-09 – 2017-11-10 (×2): 2 via RESPIRATORY_TRACT
  Filled 2017-11-09: qty 8.8

## 2017-11-09 MED ORDER — METHOCARBAMOL 1000 MG/10ML IJ SOLN
500.0000 mg | Freq: Four times a day (QID) | INTRAVENOUS | Status: DC | PRN
  Filled 2017-11-09: qty 5

## 2017-11-09 MED ORDER — EPHEDRINE SULFATE-NACL 50-0.9 MG/10ML-% IV SOSY
PREFILLED_SYRINGE | INTRAVENOUS | Status: DC | PRN
  Administered 2017-11-09: 10 mg via INTRAVENOUS

## 2017-11-09 MED ORDER — CITALOPRAM HYDROBROMIDE 40 MG PO TABS
40.0000 mg | ORAL_TABLET | Freq: Every day | ORAL | Status: DC
  Administered 2017-11-09: 40 mg via ORAL
  Filled 2017-11-09: qty 1

## 2017-11-09 MED ORDER — ONDANSETRON HCL 4 MG/2ML IJ SOLN: 4.0000 mg | Freq: Four times a day (QID) | INTRAMUSCULAR | Status: DC | PRN

## 2017-11-09 MED ORDER — 0.9 % SODIUM CHLORIDE (POUR BTL) OPTIME
TOPICAL | Status: DC | PRN
  Administered 2017-11-09: 1000 mL

## 2017-11-09 MED ORDER — MAGNESIUM CITRATE PO SOLN: 1.0000 | Freq: Once | ORAL | Status: DC | PRN

## 2017-11-09 MED ORDER — ROCURONIUM BROMIDE 50 MG/5ML IV SOSY
PREFILLED_SYRINGE | INTRAVENOUS | Status: AC
  Filled 2017-11-09: qty 5

## 2017-11-09 MED ORDER — BUPIVACAINE LIPOSOME 1.3 % IJ SUSP
INTRAMUSCULAR | Status: DC | PRN
  Administered 2017-11-09: 10 mL via PERINEURAL

## 2017-11-09 MED ORDER — METHOCARBAMOL 500 MG PO TABS
500.0000 mg | ORAL_TABLET | Freq: Four times a day (QID) | ORAL | Status: DC | PRN
  Administered 2017-11-09: 500 mg via ORAL
  Filled 2017-11-09: qty 1

## 2017-11-09 MED ORDER — ACETAMINOPHEN 325 MG PO TABS: 325.0000 mg | ORAL_TABLET | Freq: Four times a day (QID) | ORAL | Status: DC | PRN

## 2017-11-09 MED ORDER — DIPHENHYDRAMINE HCL 12.5 MG/5ML PO ELIX: 12.5000 mg | ORAL_SOLUTION | ORAL | Status: DC | PRN

## 2017-11-09 MED ORDER — PHENYLEPHRINE 40 MCG/ML (10ML) SYRINGE FOR IV PUSH (FOR BLOOD PRESSURE SUPPORT)
PREFILLED_SYRINGE | INTRAVENOUS | Status: DC | PRN
  Administered 2017-11-09: 80 ug via INTRAVENOUS

## 2017-11-09 MED ORDER — FENTANYL CITRATE (PF) 100 MCG/2ML IJ SOLN
INTRAMUSCULAR | Status: DC | PRN
  Administered 2017-11-09: 50 ug via INTRAVENOUS

## 2017-11-09 MED ORDER — PHENOL 1.4 % MT LIQD: 1.0000 | OROMUCOSAL | Status: DC | PRN

## 2017-11-09 MED ORDER — SODIUM CHLORIDE 0.9 % IV SOLN
INTRAVENOUS | Status: DC | PRN
  Administered 2017-11-09: 50 ug/min via INTRAVENOUS

## 2017-11-09 MED ORDER — BISACODYL 5 MG PO TBEC: 5.0000 mg | DELAYED_RELEASE_TABLET | Freq: Every day | ORAL | Status: DC | PRN

## 2017-11-09 MED ORDER — OXYCODONE HCL 5 MG PO TABS
5.0000 mg | ORAL_TABLET | ORAL | Status: DC | PRN
  Administered 2017-11-09: 5 mg via ORAL
  Filled 2017-11-09: qty 1

## 2017-11-09 MED ORDER — LOSARTAN POTASSIUM 50 MG PO TABS
100.0000 mg | ORAL_TABLET | Freq: Every day | ORAL | Status: DC
  Administered 2017-11-09: 100 mg via ORAL
  Filled 2017-11-09: qty 2

## 2017-11-09 MED ORDER — LACTATED RINGERS IV SOLN
INTRAVENOUS | Status: DC
  Administered 2017-11-09: 13:00:00 via INTRAVENOUS

## 2017-11-09 MED ORDER — CHLORHEXIDINE GLUCONATE 4 % EX LIQD: 60.0000 mL | Freq: Once | CUTANEOUS | Status: DC

## 2017-11-09 MED ORDER — LORATADINE 10 MG PO TABS: 10.0000 mg | ORAL_TABLET | Freq: Every day | ORAL | Status: DC | PRN

## 2017-11-09 MED ORDER — LACTATED RINGERS IV SOLN
INTRAVENOUS | Status: DC | PRN
  Administered 2017-11-09: 07:00:00 via INTRAVENOUS

## 2017-11-09 MED ORDER — EPHEDRINE 5 MG/ML INJ
INTRAVENOUS | Status: AC
  Filled 2017-11-09: qty 10

## 2017-11-09 MED ORDER — ONDANSETRON HCL 4 MG/2ML IJ SOLN
INTRAMUSCULAR | Status: AC
  Filled 2017-11-09: qty 2

## 2017-11-09 MED ORDER — DOCUSATE SODIUM 100 MG PO CAPS
100.0000 mg | ORAL_CAPSULE | Freq: Two times a day (BID) | ORAL | Status: DC
  Administered 2017-11-09 – 2017-11-10 (×3): 100 mg via ORAL
  Filled 2017-11-09 (×3): qty 1

## 2017-11-09 MED ORDER — PHENYLEPHRINE 40 MCG/ML (10ML) SYRINGE FOR IV PUSH (FOR BLOOD PRESSURE SUPPORT)
PREFILLED_SYRINGE | INTRAVENOUS | Status: AC
  Filled 2017-11-09: qty 10

## 2017-11-09 MED ORDER — ALUM & MAG HYDROXIDE-SIMETH 200-200-20 MG/5ML PO SUSP: 30.0000 mL | ORAL | Status: DC | PRN

## 2017-11-09 MED ORDER — OXYCODONE HCL 5 MG PO TABS: 10.0000 mg | ORAL_TABLET | ORAL | Status: DC | PRN

## 2017-11-09 MED ORDER — PROPOFOL 10 MG/ML IV BOLUS
INTRAVENOUS | Status: DC | PRN
  Administered 2017-11-09: 130 mg via INTRAVENOUS

## 2017-11-09 MED ORDER — ONDANSETRON HCL 4 MG/2ML IJ SOLN: 4.0000 mg | Freq: Once | INTRAMUSCULAR | Status: DC | PRN

## 2017-11-09 MED ORDER — MENTHOL 3 MG MT LOZG: 1.0000 | LOZENGE | OROMUCOSAL | Status: DC | PRN

## 2017-11-09 MED ORDER — DEXAMETHASONE SODIUM PHOSPHATE 10 MG/ML IJ SOLN
INTRAMUSCULAR | Status: DC | PRN
  Administered 2017-11-09: 10 mg via INTRAVENOUS

## 2017-11-09 MED ORDER — BUPIVACAINE HCL (PF) 0.5 % IJ SOLN
INTRAMUSCULAR | Status: DC | PRN
  Administered 2017-11-09: 15 mL via PERINEURAL

## 2017-11-09 MED ORDER — FENTANYL CITRATE (PF) 250 MCG/5ML IJ SOLN
INTRAMUSCULAR | Status: AC
  Filled 2017-11-09: qty 5

## 2017-11-09 MED ORDER — LIDOCAINE 2% (20 MG/ML) 5 ML SYRINGE
INTRAMUSCULAR | Status: AC
  Filled 2017-11-09: qty 5

## 2017-11-09 MED ORDER — HYDROMORPHONE HCL 1 MG/ML IJ SOLN: 0.5000 mg | INTRAMUSCULAR | Status: DC | PRN

## 2017-11-09 MED ORDER — METOCLOPRAMIDE HCL 5 MG PO TABS: 5.0000 mg | ORAL_TABLET | Freq: Three times a day (TID) | ORAL | Status: DC | PRN

## 2017-11-09 MED ORDER — ATORVASTATIN CALCIUM 10 MG PO TABS
10.0000 mg | ORAL_TABLET | Freq: Every day | ORAL | Status: DC
  Administered 2017-11-09: 10 mg via ORAL
  Filled 2017-11-09: qty 1

## 2017-11-09 MED ORDER — GABAPENTIN 600 MG PO TABS
600.0000 mg | ORAL_TABLET | Freq: Every day | ORAL | Status: DC
  Administered 2017-11-09: 600 mg via ORAL
  Filled 2017-11-09: qty 1

## 2017-11-09 MED ORDER — METOCLOPRAMIDE HCL 5 MG/ML IJ SOLN: 5.0000 mg | Freq: Three times a day (TID) | INTRAMUSCULAR | Status: DC | PRN

## 2017-11-09 SURGICAL SUPPLY — 54 items
BASEPLATE GLENOID SHLDR SM (Shoulder) ×2 IMPLANT
BLADE SAW SGTL 83.5X18.5 (BLADE) ×2 IMPLANT
COVER SURGICAL LIGHT HANDLE (MISCELLANEOUS) ×2 IMPLANT
CUP SUT UNIV REVERS 36+2 LEFT (Cup) ×2 IMPLANT
DERMABOND ADVANCED (GAUZE/BANDAGES/DRESSINGS) ×1
DERMABOND ADVANCED .7 DNX12 (GAUZE/BANDAGES/DRESSINGS) ×1 IMPLANT
DRAPE ORTHO SPLIT 77X108 STRL (DRAPES) ×2
DRAPE SURG 17X11 SM STRL (DRAPES) ×2 IMPLANT
DRAPE SURG ORHT 6 SPLT 77X108 (DRAPES) ×2 IMPLANT
DRAPE U-SHAPE 47X51 STRL (DRAPES) ×2 IMPLANT
DRSG AQUACEL AG ADV 3.5X10 (GAUZE/BANDAGES/DRESSINGS) ×2 IMPLANT
DURAPREP 26ML APPLICATOR (WOUND CARE) ×2 IMPLANT
ELECT BLADE 4.0 EZ CLEAN MEGAD (MISCELLANEOUS) ×2
ELECT CAUTERY BLADE 6.4 (BLADE) ×2 IMPLANT
ELECT REM PT RETURN 9FT ADLT (ELECTROSURGICAL) ×2
ELECTRODE BLDE 4.0 EZ CLN MEGD (MISCELLANEOUS) ×1 IMPLANT
ELECTRODE REM PT RTRN 9FT ADLT (ELECTROSURGICAL) ×1 IMPLANT
FACESHIELD WRAPAROUND (MASK) ×6 IMPLANT
GLENOSPHERE LATERAL 36MM+4 (Shoulder) ×2 IMPLANT
GLOVE BIO SURGEON STRL SZ7.5 (GLOVE) ×2 IMPLANT
GLOVE BIO SURGEON STRL SZ8 (GLOVE) ×2 IMPLANT
GLOVE EUDERMIC 7 POWDERFREE (GLOVE) ×2 IMPLANT
GLOVE SS BIOGEL STRL SZ 7.5 (GLOVE) ×1 IMPLANT
GLOVE SUPERSENSE BIOGEL SZ 7.5 (GLOVE) ×1
GOWN STRL REUS W/ TWL LRG LVL3 (GOWN DISPOSABLE) ×1 IMPLANT
GOWN STRL REUS W/ TWL XL LVL3 (GOWN DISPOSABLE) ×2 IMPLANT
GOWN STRL REUS W/TWL LRG LVL3 (GOWN DISPOSABLE) ×1
GOWN STRL REUS W/TWL XL LVL3 (GOWN DISPOSABLE) ×2
KIT BASIN OR (CUSTOM PROCEDURE TRAY) ×2 IMPLANT
KIT TURNOVER KIT B (KITS) ×2 IMPLANT
LINER HUMERAL 36 +3MM SM (Shoulder) ×2 IMPLANT
MANIFOLD NEPTUNE II (INSTRUMENTS) ×2 IMPLANT
NEEDLE TAPERED W/ NITINOL LOOP (MISCELLANEOUS) ×2 IMPLANT
NS IRRIG 1000ML POUR BTL (IV SOLUTION) ×2 IMPLANT
PACK SHOULDER (CUSTOM PROCEDURE TRAY) ×2 IMPLANT
PAD ARMBOARD 7.5X6 YLW CONV (MISCELLANEOUS) ×4 IMPLANT
RESTRAINT HEAD UNIVERSAL NS (MISCELLANEOUS) ×2 IMPLANT
SCREW CENTRAL NONLOCK 6.5X20MM (Shoulder) ×2 IMPLANT
SCREW LOCK GLENOID UNI PERI (Screw) ×2 IMPLANT
SCREW LOCK PERIPHERAL 30MM (Shoulder) ×2 IMPLANT
SET PIN UNIVERSAL REVERSE (SET/KITS/TRAYS/PACK) ×2 IMPLANT
SLING ARM FOAM STRAP LRG (SOFTGOODS) IMPLANT
SLING ARM IMMOBILIZER LRG (SOFTGOODS) ×2 IMPLANT
SPONGE LAP 18X18 X RAY DECT (DISPOSABLE) ×2 IMPLANT
SPONGE LAP 4X18 RFD (DISPOSABLE) ×2 IMPLANT
STEM HUMERAL MOD SZ 5 135 DEG (Stem) ×2 IMPLANT
SUT MNCRL AB 3-0 PS2 18 (SUTURE) ×2 IMPLANT
SUT MON AB 2-0 CT1 27 (SUTURE) ×2 IMPLANT
SUT VIC AB 1 CT1 27 (SUTURE) ×1
SUT VIC AB 1 CT1 27XBRD ANBCTR (SUTURE) ×1 IMPLANT
SUTURE TAPE 1.3 40 TPR END (SUTURE) ×1 IMPLANT
SUTURETAPE 1.3 40 TPR END (SUTURE) ×2
TOWEL OR 17X26 10 PK STRL BLUE (TOWEL DISPOSABLE) ×2 IMPLANT
WATER STERILE IRR 1000ML POUR (IV SOLUTION) ×2 IMPLANT

## 2017-11-09 NOTE — Transfer of Care (Signed)
Immediate Anesthesia Transfer of Care Note  Patient: Heather Christensen  Procedure(s) Performed: REVERSE LEFT SHOULDER ARTHROPLASTY (Left Shoulder)  Patient Location: PACU  Anesthesia Type:GA combined with regional for post-op pain  Level of Consciousness: awake, alert  and oriented  Airway & Oxygen Therapy: Patient Spontanous Breathing and Patient connected to nasal cannula oxygen  Post-op Assessment: Report given to RN, Post -op Vital signs reviewed and stable and Patient moving all extremities  Post vital signs: Reviewed and stable  Last Vitals:  Vitals Value Taken Time  BP 134/85 11/09/2017  9:37 AM  Temp    Pulse 75 11/09/2017  9:41 AM  Resp 23 11/09/2017  9:41 AM  SpO2 99 % 11/09/2017  9:41 AM  Vitals shown include unvalidated device data.  Last Pain:  Vitals:   11/09/17 0624  TempSrc:   PainSc: 6          Complications: No apparent anesthesia complications

## 2017-11-09 NOTE — Plan of Care (Signed)

## 2017-11-09 NOTE — Anesthesia Procedure Notes (Signed)
Anesthesia Regional Block: Interscalene brachial plexus block   Pre-Anesthetic Checklist: ,, timeout performed, Correct Patient, Correct Site, Correct Laterality, Correct Procedure, Correct Position, site marked, Risks and benefits discussed,  Surgical consent,  Pre-op evaluation,  At surgeon's request and post-op pain management  Laterality: Left  Prep: chloraprep       Needles:  Injection technique: Single-shot  Needle Type: Echogenic Stimulator Needle     Needle Length: 9cm  Needle Gauge: 21     Additional Needles:   Procedures:, nerve stimulator,,, ultrasound used (permanent image in chart),,,,   Nerve Stimulator or Paresthesia:  Response: deltoid, 0.5 mA,   Additional Responses:   Narrative:  Start time: 11/09/2017 7:05 AM End time: 11/09/2017 7:12 AM Injection made incrementally with aspirations every 5 mL.  Performed by: Personally  Anesthesiologist: Suzette Battiest, MD

## 2017-11-09 NOTE — Anesthesia Postprocedure Evaluation (Signed)
Anesthesia Post Note  Patient: Heather Christensen  Procedure(s) Performed: REVERSE LEFT SHOULDER ARTHROPLASTY (Left Shoulder)     Patient location during evaluation: PACU Anesthesia Type: General Level of consciousness: awake and alert Pain management: pain level controlled Vital Signs Assessment: post-procedure vital signs reviewed and stable Respiratory status: spontaneous breathing, nonlabored ventilation, respiratory function stable and patient connected to nasal cannula oxygen Cardiovascular status: blood pressure returned to baseline and stable Postop Assessment: no apparent nausea or vomiting Anesthetic complications: no    Last Vitals:  Vitals:   11/09/17 0937 11/09/17 1029  BP: 134/85 (!) 144/59  Pulse:  69  Resp:  16  Temp: 36.7 C 37 C  SpO2:  96%    Last Pain:  Vitals:   11/09/17 1029  TempSrc: Oral  PainSc:                  Tiajuana Amass

## 2017-11-09 NOTE — Op Note (Signed)
11/09/2017  9:26 AM  PATIENT:   Heather Christensen  67 y.o. female  PRE-OPERATIVE DIAGNOSIS:  Left shoulder post traumatic arthropathy  POST-OPERATIVE DIAGNOSIS: Same  PROCEDURE: Left shoulder reverse arthroplasty utilizing a press-fit size 5.5 Arthrex stem with a posterior offset metaphysis, a +3 polyethylene insert, and a 36/+4 glenosphere on a small baseplate.  SURGEON:  Marin Shutter M.D.  ASSISTANTS: Jenetta Loges, PA-C  ANESTHESIA:   General endotracheal with an interscalene block utilizing Exparel  EBL: 200 cc  SPECIMEN: None  Drains: None   PATIENT DISPOSITION:  PACU - hemodynamically stable.    PLAN OF CARE: Admit for overnight observation  Brief history:  Heather Christensen has been followed for chronic and progressively increasing left shoulder pain related to a previous proximal humeral fracture with subsequent malunion.  She did have a previous surgical procedure of which the details were unclear.  Her current examination shows profoundly restricted mobility with severe pain.  Her plain radiographs confirm marked varus collapse of the humeral head with complete loss of the joint space as well as subchondral sclerosis and peripheral osteophyte formation.  Additionally, the humeral head appears high riding with apparent loss of rotator cuff function.  She is brought to the operating this time for planned left shoulder reverse arthroplasty  Preoperatively I did counseled Heather Christensen regarding treatment options as well as the potential risks versus benefits thereof.  Possible surgical complications were reviewed including the potential for bleeding, infection, neurovascular injury, persistent pain, loss of motion, failure the implant, anesthetic complication, and possible need for additional surgery.  She understands and accepts and agrees with her planned procedure.  Procedure detail:  After undergoing routine preop evaluation patient received prophylactic  antibiotics and interscalene block with Exparel was established in the holding area by the anesthesia department.  Placed supine on the operative table underwent smooth induction of a general endotracheal anesthesia.  Placed in the beachchair position and appropriately padded and protected.  Left shoulder girdle region was sterilely prepped and draped in standard fashion.  Timeout was called.  An anterior deltopectoral approach left shoulder was made through a 10 cm incision.  Skin flaps were elevated dissection carried deeply and electrocautery was used for hemostasis.  The deltopectoral interval was then developed with the cephalic vein taken laterally with the deltoid.  The upper centimeter and half of the pectoralis major tendon was then tenotomized to enhance exposure the long head biceps tendon was then tenodesed at the upper border of the pectoralis major tendon and was then tenotomized and the proximal segment was then unroofed and excised.  Conjoined tendon was mobilized and retracted medially.  I performed a subscapularis peel allowing access to the humeral head which was severely deformed and the subscapularis showed profound scarring with no elasticity and so we did not feel that any attempted a subscapularis repair would be possible.  Performed a subperiosteal dissection of the capsule attachments from the anterior inferior and inferior margins of the humeral neck and also excised some remnants of rotator cuff tissue superiorly such that we could deliver the head through the wound and again showed marked deformity and degeneration.  Once the humeral head was completely delivered through the wound we then outlined the proposed humeral head resection with the extra medullary guide and the head was resected using an oscillating saw.  The osteophytes of the margin of the humeral neck were then removed with rondure.  A metal cap was then placed over the cut proximal humeral surface  and then we exposed the  glenoid with combination of Fukuda, pitchfork, inspecting retractors.  I performed a circumferential labral resection gaining complete visualization the periphery of the glenoid.  A guidepin was then placed into the central aspect of the glenoid favoring somewhat toward the inferior margin of the glenoid and this was introduced with a 10 degree inferior tilt and we then reamed to prepare the glenoid with both the central peripheral reamers gaining an excellent subchondral bony bed for fixation of our baseplate.  The baseplate was then impacted and our central lag screw was then placed followed by the inferior and superior locking screws all of which obtained excellent bony purchase and fixation.  And then performed peripheral reaming around the glenoid baseplate at this time we had attempted to place the glenosphere but there was insufficient soft tissue mobility so we turned attention back to the humeral metaphysis and performed hand reaming and then broaching to size 5.5 which had a very tight fit.  The metaphysis was then prepared.  Once we gained this additional working room we were able to go ahead and impacted our 36+4 glenosphere onto the baseplate then placed our trial implant and there was clearly inadequate space for appropriate soft tissue balance and so we returned to attention back to the humeral shaft performed additional broaching gaining the proceeding of the implants and again  The proximal metaphysis with the offset reamer and this point then we are able to achieve excellent soft tissue balance and reduction of the implant.  At this point then the trial was removed the final implant was assembled on the back table the 5.5 stem with the 36 metaphysis was then impacted into the humeral shaft obtaining excellent fit and fixation.  Prior reduction showed good soft tissue balance with the +3 poly-.  The final +3 polyethylene insert was then placed final reduction was then performed.  This showed  excellent soft tissue balance good shoulder motion good stability.  Wounds then copiously irrigated.  Final hemostasis was obtained.  We again confirmed that the subscapularis did not have any elasticity was completed scarified so we did not repair the subscap.  The deltopectoral interval was then reapproximated with a series of interrupted figure-of-eight #1 Vicryl sutures.  2-0 Monocryl used for the subcu layer intracuticular through Monocryl for the skin followed by Dermabond and Aquasol dressing left arm was then placed into a sling and the patient was awakened extubated and taken recovery room in stable condition  [facility], PA-C was used as an Environmental consultant throughout this case essential for help with positioning the patient, position extremity, tissue manipulation, implantation the prosthesis, wound closure, and intraoperative decision-making.  Marin Shutter MD    Contact # [phone]

## 2017-11-09 NOTE — H&P (Signed)
Heather Christensen    Chief Complaint: Left shoulder arthropathy HPI: The patient is a 67 y.o. female with end stage left shoulder post traumatic arthritis  Past Medical History:  Diagnosis Date  . Anemia    pernicious anemia  . Arthritis   . Asthma    seasonal, advair inhaler  . Complication of anesthesia    slow to wake  . Depression   . Dysrhythmia    palpitations, PVCs, SVT  . Hypertension   . Hypothyroidism   . Thyroid disease     Past Surgical History:  Procedure Laterality Date  . BREAST SURGERY Right    lumpectomy - begin  . EYE SURGERY     cataracts  . leg surgeries     fell leg got caught in a step stool and the toilet in the bathroom and crushed leg  . NECK SURGERY     lypoma removed from left side up behind ear (size of hand) beign  . SHOULDER SURGERY Left    rotator cuff, ball broke   . TONSILLECTOMY      History reviewed. No pertinent family history.  Social History:  reports that she has never smoked. She has never used smokeless tobacco. She reports that she has current or past drug history. She reports that she does not drink alcohol.   Medications Prior to Admission  Medication Sig Dispense Refill  . aspirin EC 81 MG tablet Take 81 mg by mouth 2 (two) times daily.    Marland Kitchen atorvastatin (LIPITOR) 10 MG tablet Take 10 mg by mouth at bedtime.    . citalopram (CELEXA) 40 MG tablet Take 40 mg by mouth at bedtime.    . gabapentin (NEURONTIN) 600 MG tablet Take 600 mg by mouth at bedtime.    Marland Kitchen levothyroxine (SYNTHROID, LEVOTHROID) 75 MCG tablet Take 75 mcg by mouth daily before breakfast.    . loratadine (CLARITIN) 10 MG tablet Take 10 mg by mouth daily as needed for allergies.    Marland Kitchen losartan (COZAAR) 100 MG tablet Take 100 mg by mouth at bedtime.    . Magnesium 500 MG TABS Take 500 mg by mouth daily.    Marland Kitchen MELATONIN PO Take 1 tablet by mouth at bedtime as needed (sleep).    . Menthol, Topical Analgesic, (BIOFREEZE EX) Apply 1 patch topically daily as  needed (pain).    . Multiple Vitamin (MULTIVITAMIN WITH MINERALS) TABS tablet Take 1 tablet by mouth daily.    . naproxen sodium (ALEVE) 220 MG tablet Take 440 mg by mouth 2 (two) times daily as needed (pain).    . potassium gluconate (HM POTASSIUM) 595 (99 K) MG TABS tablet Take 595 mg by mouth daily.    . TURMERIC PO Take 1 capsule by mouth daily.    . Fluticasone-Salmeterol (ADVAIR) 250-50 MCG/DOSE AEPB Inhale 1 puff into the lungs 2 (two) times daily as needed (shortness of breath).    . naproxen (NAPROSYN) 375 MG tablet Take 1 tablet (375 mg total) by mouth 2 (two) times daily. (Patient not taking: Reported on 10/26/2017) 20 tablet 0     Physical Exam: left shoulder with painful and restricted motion as noted at recent office visits  Vitals  Temp:  [98.3 F (36.8 C)] 98.3 F (36.8 C) (08/29 0603) Pulse Rate:  [70] 70 (08/29 0603) Resp:  [20] 20 (08/29 0603) BP: (153)/(67) 153/67 (08/29 0603) SpO2:  [96 %] 96 % (08/29 0603)  Assessment/Plan  Impression: Left shoulder arthropathy  Plan of Action:  Procedure(s): REVERSE LEFT SHOULDER ARTHROPLASTY  Jaramie Bastos [name] 11/09/2017, 7:26 AM Contact # [phone]

## 2017-11-09 NOTE — Plan of Care (Signed)
°  Problem: Elimination: °Goal: Will not experience complications related to bowel motility °Outcome: Progressing °  °Problem: Pain Managment: °Goal: General experience of comfort will improve °Outcome: Progressing °  °

## 2017-11-09 NOTE — Anesthesia Procedure Notes (Signed)
Procedure Name: Intubation Date/Time: 11/09/2017 7:38 AM Performed by: Kyung Rudd, CRNA Pre-anesthesia Checklist: Patient identified, Emergency Drugs available, Suction available, Patient being monitored and Timeout performed Patient Re-evaluated:Patient Re-evaluated prior to induction Oxygen Delivery Method: Circle system utilized Preoxygenation: Pre-oxygenation with 100% oxygen Induction Type: IV induction Ventilation: Mask ventilation without difficulty Laryngoscope Size: Mac and 3 Grade View: Grade I Tube type: Oral Tube size: 7.0 mm Number of attempts: 1 Airway Equipment and Method: Stylet Placement Confirmation: ETT inserted through vocal cords under direct vision,  positive ETCO2 and breath sounds checked- equal and bilateral Secured at: 21 cm Tube secured with: Tape Dental Injury: Teeth and Oropharynx as per pre-operative assessment

## 2017-11-09 NOTE — Discharge Instructions (Signed)
° °Kevin M. Supple, M.D., F.A.A.O.S. °Orthopaedic Surgery °Specializing in Arthroscopic and Reconstructive °Surgery of the Shoulder and Knee °[phone] °[address] - [place], [place] [address] - Fax [phone] ° ° °POST-OP TOTAL SHOULDER REPLACEMENT INSTRUCTIONS ° °1. Call the office at [phone] to schedule your first post-op appointment 10-14 days from the date of your surgery. ° °2. The bandage over your incision is waterproof. You may begin showering with this dressing on. You may leave this dressing on until first follow up appointment within 2 weeks. We prefer you leave this dressing in place until follow up however after 5-7 days if you are having itching or skin irritation and would like to remove it you may do so. Go slow and tug at the borders gently to break the bond the dressing has with the skin. At this point if there is no drainage it is okay to go without a bandage or you may cover it with a light guaze and tape. You can also expect significant bruising around your shoulder that will drift down your arm and into your chest wall. This is very normal and should resolve over several days. ° ° 3. Wear your sling/immobilizer at all times except to perform the exercises below or to occasionally let your arm dangle by your side to stretch your elbow. You also need to sleep in your sling immobilizer until instructed otherwise. ° °4. Range of motion to your elbow, wrist, and hand are encouraged 3-5 times daily. Exercise to your hand and fingers helps to reduce swelling you may experience. ° °5. Utilize ice to the shoulder 3-5 times minimum a day and additionally if you are experiencing pain. ° °6. Prescriptions for a pain medication and a muscle relaxant are provided for you. It is recommended that if you are experiencing pain that you pain medication alone is not controlling, add the muscle relaxant along with the pain medication which can give additional pain relief. The first 1-2 days  is generally the most severe of your pain and then should gradually decrease. As your pain lessens it is recommended that you decrease your use of the pain medications to an "as needed basis'" only and to always comply with the recommended dosages of the pain medications. ° °7. Pain medications can produce constipation along with their use. If you experience this, the use of an over the counter stool softener or laxative daily is recommended.  ° °8. For additional questions or concerns, please do not hesitate to call the office. If after hours there is an answering service to forward your concerns to the physician on call. ° °9.Pain control following an exparel block ° °To help control your post-operative pain you received a nerve block  performed with Exparel which is a long acting anesthetic (numbing agent) which can provide pain relief and sensations of numbness (and relief of pain) in the operative shoulder and arm for up to 3 days. Sometimes it provides mixed relief, meaning you may still have numbness in certain areas of the arm but can still be able to move  parts of that arm, hand, and fingers. We recommend that your prescribed pain medications  be used as needed. We do not feel it is necessary to "pre medicate" and "stay ahead" of pain.  Taking narcotic pain medications when you are not having any pain can lead to unnecessary and potentially dangerous side effects.  ° °POST-OP EXERCISES ° °Pendulum Exercises ° °Perform pendulum exercises while standing and bending at   the waist. Support your uninvolved arm on a table or chair and allow your operated arm to hang freely. Make sure to do these exercises passively - not using you shoulder muscles. ° °Repeat 20 times. Do 3 sessions per day. ° ° ° ° °

## 2017-11-10 ENCOUNTER — Encounter (HOSPITAL_COMMUNITY): Payer: Self-pay | Admitting: Orthopedic Surgery

## 2017-11-10 MED ORDER — METHOCARBAMOL 500 MG PO TABS: 500.0000 mg | ORAL_TABLET | Freq: Three times a day (TID) | ORAL | 1 refills | Status: DC | PRN

## 2017-11-10 MED ORDER — ONDANSETRON HCL 4 MG PO TABS: 4.0000 mg | ORAL_TABLET | Freq: Three times a day (TID) | ORAL | 0 refills | Status: DC | PRN

## 2017-11-10 MED ORDER — OXYCODONE-ACETAMINOPHEN 5-325 MG PO TABS: 1.0000 | ORAL_TABLET | ORAL | 0 refills | Status: DC | PRN

## 2017-11-10 MED ORDER — TRAMADOL HCL 50 MG PO TABS: 50.0000 mg | ORAL_TABLET | Freq: Four times a day (QID) | ORAL | 0 refills | Status: DC | PRN

## 2017-11-10 NOTE — Progress Notes (Signed)
Heather Christensen  MRN: [id number] DOB/Age: [birth year] 66 y.o. [facility] Procedure: Procedure(s) (LRB): REVERSE LEFT SHOULDER ARTHROPLASTY (Left)     Subjective: Doing well this am, block still working at surgical site. Medicated last night for discomfort in periscapular area  Vital Signs Temp:  [97.9 F (36.6 C)-98.6 F (37 C)] 98.2 F (36.8 C) (08/30 0520) Pulse Rate:  [68-86] 68 (08/30 0520) Resp:  [15-16] 15 (08/30 0520) BP: (130-144)/(59-85) 135/65 (08/30 0520) SpO2:  [95 %-99 %] 98 % (08/30 0520) Weight:  [95.9 kg] 95.9 kg (08/29 1029)  Lab Results No results for input(s): WBC, HGB, HCT, PLT in the last 72 hours. BMET No results for input(s): NA, K, CL, CO2, GLUCOSE, BUN, CREATININE, CALCIUM in the last 72 hours. No results found for: INR   Exam Dressing dry Moves hand but block still in effect proximally        Plan DC home after OT  [facility] PA-C  11/10/2017, 8:09 AM Contact # [phone]

## 2017-11-10 NOTE — Progress Notes (Signed)
Provided discharge education/instructions, all questions and concerns addressed, discharged home with belongings accompanied by family.

## 2017-11-10 NOTE — Discharge Summary (Signed)
PATIENT ID:      Heather Christensen  MRN:     [id number] DOB/AGE:    1950-07-31 / 67 y.o.     DISCHARGE SUMMARY  ADMISSION DATE:    11/09/2017 DISCHARGE DATE:    ADMISSION DIAGNOSIS: Left shoulder arthropathy Past Medical History:  Diagnosis Date  . Anemia    pernicious anemia  . Arthritis   . Asthma    seasonal, advair inhaler  . Complication of anesthesia    slow to wake  . Depression   . Dysrhythmia    palpitations, PVCs, SVT  . Hypertension   . Hypothyroidism   . Thyroid disease     DISCHARGE DIAGNOSIS:   Active Problems:   S/p reverse total shoulder arthroplasty   PROCEDURE: Procedure(s): REVERSE LEFT SHOULDER ARTHROPLASTY on 11/09/2017  CONSULTS:    HISTORY:  See H&P in chart.  HOSPITAL COURSE:  Heather Christensen is a 67 y.o. admitted on 11/09/2017 with a diagnosis of Left shoulder arthropathy.  They were brought to the operating room on 11/09/2017 and underwent Procedure(s): REVERSE LEFT SHOULDER ARTHROPLASTY.    They were given perioperative antibiotics:  Anti-infectives (From admission, onward)   Start     Dose/Rate Route Frequency Ordered Stop   11/09/17 0615  ceFAZolin (ANCEF) IVPB 2g/100 mL premix     2 g 200 mL/hr over 30 Minutes Intravenous On call to O.R. 11/09/17 [id number] 11/09/17 0750    .  Patient underwent the above named procedure and tolerated it well. The following day they were hemodynamically stable and pain was controlled on oral analgesics. They were neurovascularly intact to the operative extremity. OT was ordered and worked with patient per protocol. They were medically and orthopaedically stable for discharge on day 1 .     DIAGNOSTIC STUDIES:  RECENT RADIOGRAPHIC STUDIES :  No results found.  RECENT VITAL SIGNS:   Patient Vitals for the past 24 hrs:  BP Temp Temp src Pulse Resp SpO2 Height Weight  11/10/17 0520 135/65 98.2 F (36.8 C) Oral 68 15 98 % - -  11/10/17 0047 134/64 97.9 F (36.6 C) Oral 79 15 99 % - -  11/09/17 2033  140/60 98.2 F (36.8 C) Oral 86 15 96 % - -  11/09/17 1213 130/63 98 F (36.7 C) Oral 72 15 95 % - -  11/09/17 1029 (!) 144/59 98.6 F (37 C) Oral 69 16 96 % 5\' 1"  (1.549 m) 95.9 kg  11/09/17 0937 134/85 98 F (36.7 C) - - - - - -  .  RECENT EKG RESULTS:   No orders found for this or any previous visit.  DISCHARGE INSTRUCTIONS:    DISCHARGE MEDICATIONS:   Allergies as of 11/10/2017      Reactions   Sulfa Antibiotics Other (See Comments)   STEVENS JOHNSON   Sulfamethoxazole Other (See Comments)   STEVENS JOHNSON   Quinolones Swelling, Other (See Comments)   SWELLING REACTION UNSPECIFIED    Codeine Nausea And Vomiting      Medication List    STOP taking these medications   naproxen 375 MG tablet Commonly known as:  NAPROSYN     TAKE these medications   aspirin EC 81 MG tablet Take 81 mg by mouth 2 (two) times daily.   atorvastatin 10 MG tablet Commonly known as:  LIPITOR Take 10 mg by mouth at bedtime.   BIOFREEZE EX Apply 1 patch topically daily as needed (pain).   citalopram 40 MG tablet Commonly known as:  CELEXA  Take 40 mg by mouth at bedtime.   Fluticasone-Salmeterol 250-50 MCG/DOSE Aepb Commonly known as:  ADVAIR Inhale 1 puff into the lungs 2 (two) times daily as needed (shortness of breath).   gabapentin 600 MG tablet Commonly known as:  NEURONTIN Take 600 mg by mouth at bedtime.   HM POTASSIUM 595 (99 K) MG Tabs tablet Generic drug:  potassium gluconate Take 595 mg by mouth daily.   levothyroxine 75 MCG tablet Commonly known as:  SYNTHROID, LEVOTHROID Take 75 mcg by mouth daily before breakfast.   loratadine 10 MG tablet Commonly known as:  CLARITIN Take 10 mg by mouth daily as needed for allergies.   losartan 100 MG tablet Commonly known as:  COZAAR Take 100 mg by mouth at bedtime.   Magnesium 500 MG Tabs Take 500 mg by mouth daily.   MELATONIN PO Take 1 tablet by mouth at bedtime as needed (sleep).   methocarbamol 500 MG  tablet Commonly known as:  ROBAXIN Take 1 tablet (500 mg total) by mouth every 8 (eight) hours as needed for muscle spasms.   multivitamin with minerals Tabs tablet Take 1 tablet by mouth daily.   naproxen sodium 220 MG tablet Commonly known as:  ALEVE Take 440 mg by mouth 2 (two) times daily as needed (pain).   ondansetron 4 MG tablet Commonly known as:  ZOFRAN Take 1 tablet (4 mg total) by mouth every 8 (eight) hours as needed for nausea or vomiting.   oxyCODONE-acetaminophen 5-325 MG tablet Commonly known as:  PERCOCET/ROXICET Take 1 tablet by mouth every 4 (four) hours as needed for severe pain (max 6 q).   traMADol 50 MG tablet Commonly known as:  ULTRAM Take 1 tablet (50 mg total) by mouth every 6 (six) hours as needed for moderate pain.   TURMERIC PO Take 1 capsule by mouth daily.       FOLLOW UP VISIT:   Follow-up Information    Justice Britain, MD.   Specialty:  Orthopedic Surgery Why:  call to be seen in 10-14 days Contact information: [address] [place] [address] [phone]           DISCHARGE QH:UTML    DISCHARGE CONDITION:  Heather Christensen Heather Christensen for Dr. Justice Britain 11/10/2017, 8:08 AM

## 2017-11-10 NOTE — Plan of Care (Signed)
  Problem: Activity: Goal: Risk for activity intolerance will decrease Outcome: Progressing   Problem: Nutrition: Goal: Adequate nutrition will be maintained Outcome: Progressing   Problem: Elimination: Goal: Will not experience complications related to bowel motility Outcome: Progressing   Problem: Pain Managment: Goal: General experience of comfort will improve Outcome: Progressing   Problem: Skin Integrity: Goal: Risk for impaired skin integrity will decrease Outcome: Progressing   

## 2017-11-10 NOTE — Evaluation (Signed)
Occupational Therapy Evaluation Patient Details Name: Heather Christensen MRN: [id number] DOB: 03-May-1950 Today's Date: 11/10/2017    History of Present Illness Pt is as 67 y/o female s/p L shoulder reverse arthroplasty d/t end stage post traumatic arthritis. PMH significant for but not limited to: anemia, arthritis, asthma, depression, HTN, L shoulder rotator cuff surgery.    Clinical Impression   PTA pt was independent and working.  She was admitted for above dx and currently requires mod assist for UB dressing, min assist for UB bathing, min assist for toileting, min assist for LB dressing, and min assist for grooming, completed toilet transfers and bed mobility with independence.  She was educated on precautions, exercises, ADL compensatory techniques, safety, pain management, positioning, sling management, and mobility.  She has support of significant other at home, able to provide 24/7 assist as needed.  At this time, all OT needs have been met acutely.  Thank you for this referral!  OT signing OFF.     Follow Up Recommendations  Follow surgeon's recommendation for DC plan and follow-up therapies;Supervision - Intermittent    Equipment Recommendations  None recommended by OT    Recommendations for Other Services       Precautions / Restrictions Precautions Precautions: Shoulder Type of Shoulder Precautions: Active protocol: AROM elbow/wrist/hand, Shoulder PROM/AAROM/AROM ER 20, Abdcution 45, FE 60 Shoulder Interventions: Shoulder sling/immobilizer;At all times;Off for dressing/bathing/exercises Precaution Booklet Issued: Yes (comment) Precaution Comments: reviewed thoroughly with patient Required Braces or Orthoses: Sling Restrictions Weight Bearing Restrictions: Yes LUE Weight Bearing: Non weight bearing      Mobility Bed Mobility Overal bed mobility: Modified Independent                Transfers Overall transfer level: Independent Equipment used: None                   Balance Overall balance assessment: No apparent balance deficits (not formally assessed)                                         ADL either performed or assessed with clinical judgement   ADL Overall ADL's : Needs assistance/impaired Eating/Feeding: Set up;Sitting   Grooming: Set up;Sitting   Upper Body Bathing: Minimal assistance;Sitting;Cueing for compensatory techniques Upper Body Bathing Details (indicate cue type and reason): educated on techniques for bathing with shoulder precautions  Lower Body Bathing: Minimal assistance;Sit to/from stand Lower Body Bathing Details (indicate cue type and reason): requires assist to reach L hip area, will have assist at home  Upper Body Dressing : Moderate assistance;Sitting;Cueing for compensatory techniques;Adhering to UE precautions Upper Body Dressing Details (indicate cue type and reason): educated on compensatory techniques for UB dressing, requires modA to thread RUE and button shirt Lower Body Dressing: Minimal assistance;Sit to/from stand Lower Body Dressing Details (indicate cue type and reason): limited reach towards L hip, significant other will be able to assist Toilet Transfer: Modified Independent;Comfort height toilet;Ambulation   Toileting- Clothing Manipulation and Hygiene: Minimal assistance;Sit to/from stand Toileting - Clothing Manipulation Details (indicate cue type and reason): A with management of clothing L hip  Tub/ Shower Transfer: Walk-in shower;Supervision/safety;Ambulation   Functional mobility during ADLs: Independent General ADL Comments: increased time and effort, good recall of techniques and precautions to L shoulder; requires assistance with L clothing over hips and shirt at this time, but significant other will be able to assist (per  pt)     Vision Baseline Vision/History: Wears glasses Wears Glasses: Reading only Patient Visual Report: No change from baseline Vision  Assessment?: No apparent visual deficits     Perception     Praxis      Pertinent Vitals/Pain Pain Assessment: 0-10 Pain Score: 2  Pain Location: back Pain Descriptors / Indicators: Aching;Discomfort     Hand Dominance Right   Extremity/Trunk Assessment Upper Extremity Assessment Upper Extremity Assessment: LUE deficits/detail LUE Deficits / Details: nerve block active and s/p reverse shoulder replacement (limited ROM at elbow and wrist, reports numbness in thumb) LUE Coordination: decreased fine motor;decreased gross motor   Lower Extremity Assessment Lower Extremity Assessment: Overall WFL for tasks assessed       Communication Communication Communication: No difficulties   Cognition Arousal/Alertness: Awake/alert Behavior During Therapy: WFL for tasks assessed/performed Overall Cognitive Status: Within Functional Limits for tasks assessed                                     General Comments       Exercises Exercises: Shoulder;Hand exercises Shoulder Exercises Pendulum Exercise: Left;5 reps;Seated;Standing(all directions, seated initally then standing ) Shoulder Flexion: PROM;Left;10 reps;Supine(to 60 ONLY) Shoulder ABduction: PROM;10 reps;Left;Supine(to 45 ONLY) Shoulder External Rotation: PROM;Left;10 reps;Supine(to 20 ONLY) Elbow Flexion: AAROM;10 reps;Left;Supine Elbow Extension: AAROM;Left;10 reps;Supine Wrist Flexion: AAROM;Left;10 reps;Supine Wrist Extension: AAROM;Left;10 reps;Supine Digit Composite Flexion: AROM;Left;10 reps;Supine Composite Extension: AROM;Left;10 reps;Supine Hand Exercises Forearm Supination: AAROM;Left;10 reps;Supine Forearm Pronation: AAROM;Left;10 reps;Supine   Shoulder Instructions Shoulder Instructions Donning/doffing shirt without moving shoulder: Moderate assistance Method for sponge bathing under operated UE: Supervision/safety Donning/doffing sling/immobilizer: Minimal assistance Correct positioning of  sling/immobilizer: Minimal assistance Pendulum exercises (written home exercise program): Supervision/safety ROM for elbow, wrist and digits of operated UE: Minimal assistance Sling wearing schedule (on at all times/off for ADL's): Modified independent Proper positioning of operated UE when showering: Supervision/safety Positioning of UE while sleeping: [place] expects to be discharged to:: Private residence Living Arrangements: Spouse/significant other Available Help at Discharge: Family;Available 24 hours/day Type of Home: House Home Access: Stairs to enter [employer] of Steps: 1 Entrance Stairs-Rails: Right Home Layout: One level     Bathroom Shower/Tub: Teacher, early years/pre: Standard     Home Equipment: Grab bars - tub/shower          Prior Functioning/Environment Level of Independence: Independent        Comments: independent and working full time         OT Problem List: Decreased strength;Decreased activity tolerance;Decreased coordination;Decreased range of motion;Decreased knowledge of precautions;Pain;Impaired UE functional use      OT Treatment/Interventions:      OT Goals(Current goals can be found in the care plan section) Acute Rehab OT Goals Patient Stated Goal: home today OT Goal Formulation: With patient  OT Frequency:     Barriers to D/C:            Co-evaluation              AM-PAC PT "6 Clicks" Daily Activity     Outcome Measure Help from another person eating meals?: A Little Help from another person taking care of personal grooming?: A Little Help from another person toileting, which includes using toliet, bedpan, or urinal?: A Little Help from another person bathing (including washing, rinsing, drying)?: A Little Help from another person to put on and taking  off regular upper body clothing?: A Lot Help from another person to put on and taking off regular lower  body clothing?: A Little 6 Click Score: 17   End of Session Equipment Utilized During Treatment: Other (comment)(sling) Nurse Communication: Mobility status  Activity Tolerance: Patient tolerated treatment well Patient left: in bed;with call bell/phone within reach  OT Visit Diagnosis: Pain Pain - Right/Left: Left Pain - part of body: Shoulder(back)                Time: 1624-4695 OT Time Calculation (min): 34 min Charges:  OT General Charges $OT Visit: 1 Visit OT Evaluation $OT Eval Low Complexity: 1 Low OT Treatments $Self Care/Home Management : 8-22 mins  Delight Stare, OTR/L  Pager [place] 11/10/2017, 10:41 AM

## 2018-01-22 NOTE — Progress Notes (Deleted)
Heather Christensen is a 67 y.o. female is here for follow up.  History of Present Illness:   HPI: See Assessment and Plan section for Problem Based Charting of issues discussed today.   There are no preventive care reminders to display for this patient. No flowsheet data found. PMHx, SurgHx, SocialHx, FamHx, Medications, and Allergies were reviewed in the Visit Navigator and updated as appropriate.   Patient Active Problem List   Diagnosis Date Noted  . S/p reverse total shoulder arthroplasty 11/09/2017   Social History   Tobacco Use  . Smoking status: Never Smoker  . Smokeless tobacco: Never Used  Substance Use Topics  . Alcohol use: Never    Frequency: Never  . 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  aspirin EC 81 MG tablet, Take 81 mg by mouth 2 (two) times daily., Disp: , Rfl:  .  atorvastatin (LIPITOR) 10 MG tablet, Take 10 mg by mouth at bedtime., Disp: , Rfl:  .  citalopram (CELEXA) 40 MG tablet, Take 40 mg by mouth at bedtime., Disp: , Rfl:  .  Fluticasone-Salmeterol (ADVAIR) 250-50 MCG/DOSE AEPB, Inhale 1 puff into the lungs 2 (two) times daily as needed (shortness of breath)., Disp: , Rfl:  .  gabapentin (NEURONTIN) 600 MG tablet, Take 600 mg by mouth at bedtime., Disp: , Rfl:  .  levothyroxine (SYNTHROID, LEVOTHROID) 75 MCG tablet, Take 75 mcg by mouth daily before breakfast., Disp: , Rfl:  .  loratadine (CLARITIN) 10 MG tablet, Take 10 mg by mouth daily as needed for allergies., Disp: , Rfl:  .  losartan (COZAAR) 100 MG tablet, Take 100 mg by mouth at bedtime., Disp: , Rfl:  .  Magnesium 500 MG TABS, Take 500 mg by mouth daily., Disp: , Rfl:  .  MELATONIN PO, Take 1 tablet by mouth at bedtime as needed (sleep)., Disp: , Rfl:  .  Menthol, Topical Analgesic, (BIOFREEZE EX), Apply 1 patch topically daily as needed (pain)., Disp: , Rfl:  .  methocarbamol (ROBAXIN) 500 MG  tablet, Take 1 tablet (500 mg total) by mouth every 8 (eight) hours as needed for muscle spasms., Disp: 30 tablet, Rfl: 1 .  Multiple Vitamin (MULTIVITAMIN WITH MINERALS) TABS tablet, Take 1 tablet by mouth daily., Disp: , Rfl:  .  naproxen sodium (ALEVE) 220 MG tablet, Take 440 mg by mouth 2 (two) times daily as needed (pain)., Disp: , Rfl:  .  ondansetron (ZOFRAN) 4 MG tablet, Take 1 tablet (4 mg total) by mouth every 8 (eight) hours as needed for nausea or vomiting., Disp: 10 tablet, Rfl: 0 .  oxyCODONE-acetaminophen (PERCOCET) 5-325 MG tablet, Take 1 tablet by mouth every 4 (four) hours as needed for severe pain (max 6 q)., Disp: 10 tablet, Rfl: 0 .  potassium gluconate (HM POTASSIUM) 595 (99 K) MG TABS tablet, Take 595 mg by mouth daily., Disp: , Rfl:  .  traMADol (ULTRAM) 50 MG tablet, Take 1 tablet (50 mg total) by mouth every 6 (six) hours as needed for moderate pain., Disp: 30 tablet, Rfl: 0 .  TURMERIC PO, Take 1 capsule by mouth daily., Disp: , Rfl:   Allergies  Allergen Reactions  . Sulfa Antibiotics Other (See Comments)    STEVENS JOHNSON  . Sulfamethoxazole Other (See Comments)    STEVENS JOHNSON  . Quinolones Swelling and Other (See Comments)    SWELLING REACTION UNSPECIFIED    .  Codeine Nausea And Vomiting   Review of Systems   Pertinent items are noted in the HPI. Otherwise, ROS is negative.  Vitals:  There were no vitals filed for this visit.   There is no height or weight on file to calculate BMI.  Physical Exam:   Physical Exam  Results for orders placed or performed during the hospital encounter of 11/09/17  Glucose, capillary  Result Value Ref Range   Glucose-Capillary 196 (H) 70 - 99 mg/dL    Assessment and Plan:   No problem-specific Assessment & Plan notes found for this encounter.  No orders of the defined types were placed in this encounter.  No orders of the defined types were placed in this encounter.   . Reviewed expectations re: course of  current medical issues. . Discussed self-management of symptoms. . Outlined signs and symptoms indicating need for more acute intervention. . Patient verbalized understanding and all questions were answered. Marland Kitchen Health Maintenance issues including appropriate healthy diet, exercise, and smoking avoidance were discussed with patient. . See orders for this visit as documented in the electronic medical record. . Patient received an After Visit Summary.  [name], DO [place], [facility] 01/22/2018

## 2018-01-26 ENCOUNTER — Ambulatory Visit: Payer: Medicare Other | Admitting: Family Medicine

## 2018-01-26 DIAGNOSIS — Z0289 Encounter for other administrative examinations: Secondary | ICD-10-CM

## 2018-02-01 ENCOUNTER — Encounter: Payer: Self-pay | Admitting: Family Medicine

## 2018-03-27 ENCOUNTER — Ambulatory Visit (INDEPENDENT_AMBULATORY_CARE_PROVIDER_SITE_OTHER): Payer: Medicare Other

## 2018-03-27 ENCOUNTER — Encounter (HOSPITAL_COMMUNITY): Payer: Self-pay

## 2018-03-27 ENCOUNTER — Ambulatory Visit (HOSPITAL_COMMUNITY)
Admission: EM | Admit: 2018-03-27 | Discharge: 2018-03-27 | Disposition: A | Discharge status: Home / Self Care | Payer: Medicare Other | Attending: Family Medicine | Admitting: Family Medicine

## 2018-03-27 ENCOUNTER — Other Ambulatory Visit: Payer: Self-pay

## 2018-03-27 DIAGNOSIS — R509 Fever, unspecified: Secondary | ICD-10-CM | POA: Diagnosis not present

## 2018-03-27 DIAGNOSIS — R69 Illness, unspecified: Secondary | ICD-10-CM | POA: Insufficient documentation

## 2018-03-27 DIAGNOSIS — R05 Cough: Secondary | ICD-10-CM | POA: Diagnosis not present

## 2018-03-27 DIAGNOSIS — E039 Hypothyroidism, unspecified: Secondary | ICD-10-CM | POA: Diagnosis not present

## 2018-03-27 DIAGNOSIS — F32A Depression, unspecified: Secondary | ICD-10-CM

## 2018-03-27 DIAGNOSIS — F329 Major depressive disorder, single episode, unspecified: Secondary | ICD-10-CM

## 2018-03-27 DIAGNOSIS — R059 Cough, unspecified: Secondary | ICD-10-CM

## 2018-03-27 DIAGNOSIS — J111 Influenza due to unidentified influenza virus with other respiratory manifestations: Secondary | ICD-10-CM

## 2018-03-27 MED ORDER — HYDROCODONE-HOMATROPINE 5-1.5 MG/5ML PO SYRP: 5.0000 mL | ORAL_SOLUTION | Freq: Four times a day (QID) | ORAL | 0 refills | Status: DC | PRN

## 2018-03-27 MED ORDER — CITALOPRAM HYDROBROMIDE 40 MG PO TABS: 40.0000 mg | ORAL_TABLET | Freq: Every day | ORAL | 2 refills | Status: DC

## 2018-03-27 MED ORDER — LEVOTHYROXINE SODIUM 75 MCG PO TABS: 75.0000 ug | ORAL_TABLET | Freq: Every day | ORAL | 2 refills | Status: DC

## 2018-03-27 NOTE — Discharge Instructions (Signed)
Be aware, your cough medication may cause drowsiness. Please do not drive, operate heavy machinery or make important decisions while on this medication, it can cloud your judgement.  Follow up with your primary care doctor or here if you are not seeing improvement of your symptoms over the next several days, sooner if you feel you are worsening.  Caring for yourself: Get plenty of rest. Drink plenty of fluids, enough so that your urine is light yellow or clear like water. If you have kidney, heart, or liver disease and have to limit fluids, talk with your doctor before you increase the amount of fluids you drink. Take an over-the-counter pain medicine if needed, such as acetaminophen (Tylenol), ibuprofen (Advil, Motrin), or naproxen (Aleve), to relieve fever, headache, and muscle aches. Read and follow all instructions on the label. No one younger than 20 should take aspirin. It has been linked to Reye syndrome, a serious illness. Before you use over the counter cough and cold medicines, check the label. These medicines may not be safe for children younger than age 35 or for people with certain health problems. If the skin around your nose and lips becomes sore, put some petroleum jelly on the area.  Avoid spreading a flu-like illness: Wash your hands regularly, and keep your hands away from your face.  Stay home from school, work, and other public places until you are feeling better and your fever has been gone for at least 24 hours. The fever needs to have gone away on its own without the help of medicine.

## 2018-03-27 NOTE — ED Provider Notes (Signed)
[place]   [id number] 03/27/18 Arrival Time: 9381  ASSESSMENT & PLAN:  1. Cough   2. Fever, unspecified fever cause   3. Influenza-like illness   4. Hypothyroidism, unspecified type   5. Depression, unspecified depression type    Requests refills of levothyroxine and citalopram. New PCP visit in April. Reports these medical conditions as stable.  I have personally viewed the imaging studies ordered this visit. No pneumonia.  See AVS for d/c instructions.  Meds ordered this encounter  Medications  . levothyroxine (SYNTHROID, LEVOTHROID) 75 MCG tablet    Sig: Take 1 tablet (75 mcg total) by mouth daily before breakfast.    Dispense:  30 tablet    Refill:  2  . citalopram (CELEXA) 40 MG tablet    Sig: Take 1 tablet (40 mg total) by mouth at bedtime.    Dispense:  30 tablet    Refill:  2  . HYDROcodone-homatropine (HYCODAN) 5-1.5 MG/5ML syrup    Sig: Take 5 mLs by mouth every 6 (six) hours as needed for cough.    Dispense:  90 mL    Refill:  0   Cough medication sedation precautions. Discussed typical duration of symptoms. OTC symptom care as needed. Ensure adequate fluid intake and rest. May f/u with PCP or here as needed.  Reviewed expectations re: course of current medical issues. Questions answered. Outlined signs and symptoms indicating need for more acute intervention. Patient verbalized understanding. After Visit Summary given.   SUBJECTIVE: History from: patient.  Heather Christensen is a 68 y.o. female who presents with complaint of nasal congestion, post-nasal drainage, and a persistent dry cough; without sore throat. Onset abrupt, about 5 days ago. Overall with fatigue and with body aches. SOB: none. Wheezing: mild at times but has not needed to use rescue inhaler. No chest soreness/pain. Fever: yes, subjective with chills; last evening. Overall normal PO intake without n/v. Known sick contacts: no. No specific or significant aggravating or  alleviating factors reported. OTC treatment: none reported.  Social History   Tobacco Use  Smoking Status Never Smoker  Smokeless Tobacco Never Used   ROS: As per HPI. All other systems negative    OBJECTIVE:  Vitals:   03/27/18 1155 03/27/18 1159  BP:  (!) 147/79  Pulse:  67  Resp:  18  Temp:  98.2 F (36.8 C)  TempSrc:  Oral  SpO2:  98%  Weight: 89.4 kg      General appearance: alert; appears fatigued HEENT: nasal congestion; clear runny nose; throat irritation secondary to post-nasal drainage Neck: supple without LAD CV: RRR Lungs: unlabored respirations, symmetrical air entry with mild bilateral expiratory wheezing; coarse breath sounds throughout; cough: marked Abd: soft Ext: no LE edema Skin: warm and dry Psychological: alert and cooperative; normal mood and affect  Imaging: Dg Chest 2 View  Result Date: 03/27/2018 CLINICAL DATA:  Fever and cough. EXAM: CHEST - 2 VIEW COMPARISON:  None. FINDINGS: Heart size is at the upper limits of normal. Normal pulmonary vascularity. Atherosclerotic calcification of the aortic arch. No focal consolidation, pleural effusion, or pneumothorax. No acute osseous abnormality. Prior left reverse total shoulder arthroplasty. IMPRESSION: 1.  No active cardiopulmonary disease. 2.  Aortic atherosclerosis (ICD10-I70.0). Electronically Signed   By: Titus Dubin M.D.   On: 03/27/2018 12:35    Allergies  Allergen Reactions  . Sulfa Antibiotics Other (See Comments)    STEVENS JOHNSON  . Sulfamethoxazole Other (See Comments)    STEVENS JOHNSON  . Quinolones Swelling  and Other (See Comments)    SWELLING REACTION UNSPECIFIED    . Codeine Nausea And Vomiting    Past Medical History:  Diagnosis Date  . Anemia    pernicious anemia  . Arthritis   . Asthma    seasonal, advair inhaler  . Complication of anesthesia    slow to wake  . Depression   . Dysrhythmia    palpitations, PVCs, SVT  . Hypertension   . Hypothyroidism   .  Thyroid disease    FH: No known lung disease.  Social History   Socioeconomic History  . Marital status: Divorced    Spouse name: Not on file  . Number of children: Not on file  . Years of education: Not on file  . Highest education level: Not on file  Occupational History  . Not on file  Social Needs  . Financial resource strain: Not on file  . Food insecurity:    Worry: Not on file    Inability: Not on file  . Transportation needs:    Medical: Not on file    Non-medical: Not on file  Tobacco Use  . Smoking status: Never Smoker  . Smokeless tobacco: Never Used  Substance and Sexual Activity  . Alcohol use: Never    Frequency: Never  . 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 Sexual activity: Not on file  Lifestyle  . Physical activity:    Days per week: Not on file    Minutes per session: Not on file  . Stress: Not on file  Relationships  . Social connections:    Talks on phone: Not on file    Gets together: Not on file    Attends religious service: Not on file    Active member of club or organization: Not on file    Attends meetings of clubs or organizations: Not on file    Relationship status: Not on file  . Intimate partner violence:    Fear of current or ex partner: Not on file    Emotionally abused: Not on file    Physically abused: Not on file    Forced sexual activity: Not on file  Other Topics Concern  . Not on file  Social History Narrative  . Not on file           Vanessa Kick, MD 03/27/18 1426

## 2018-03-27 NOTE — ED Triage Notes (Signed)
Pt cc coughing a lot since last Friday.  Chest congestion . Body aches as well

## 2018-04-03 ENCOUNTER — Ambulatory Visit (HOSPITAL_COMMUNITY)
Admission: EM | Admit: 2018-04-03 | Discharge: 2018-04-03 | Disposition: A | Discharge status: Home / Self Care | Payer: Medicare Other | Attending: Family Medicine | Admitting: Family Medicine

## 2018-04-03 ENCOUNTER — Encounter (HOSPITAL_COMMUNITY): Payer: Self-pay

## 2018-04-03 DIAGNOSIS — J40 Bronchitis, not specified as acute or chronic: Secondary | ICD-10-CM | POA: Diagnosis not present

## 2018-04-03 MED ORDER — BENZONATATE 100 MG PO CAPS: 100.0000 mg | ORAL_CAPSULE | Freq: Three times a day (TID) | ORAL | 0 refills | Status: DC | PRN

## 2018-04-03 MED ORDER — PREDNISONE 20 MG PO TABS: ORAL_TABLET | ORAL | 0 refills | Status: DC

## 2018-04-03 NOTE — Discharge Instructions (Addendum)
The cough derives from the original infection and subsequent disruption of the bronchial lining.

## 2018-04-03 NOTE — ED Triage Notes (Signed)
Pt states seen and tx'd last wk with no relief. C/o head and chest congestion with a deep dry cough

## 2018-04-03 NOTE — ED Provider Notes (Signed)
[place]    CSN: [id number] Arrival date & time: 04/03/18  4801     History   Chief Complaint Chief Complaint  Patient presents with  . Cough    HPI Heather Christensen is a 68 y.o. female.   Pt states seen and tx'd last wk with no relief. C/o head and chest congestion with a deep dry cough.  Patient has a remote h/o smoking and asthma.  She works at [employer] and is herself in recovery.  The cough is accompanied by wheezing.  No fever or shortness of breath.     Past Medical History:  Diagnosis Date  . Anemia    pernicious anemia  . Arthritis   . Asthma    seasonal, advair inhaler  . Complication of anesthesia    slow to wake  . Depression   . Dysrhythmia    palpitations, PVCs, SVT  . Hypertension   . Hypothyroidism   . Thyroid disease     Patient Active Problem List   Diagnosis Date Noted  . S/p reverse total shoulder arthroplasty 11/09/2017    Past Surgical History:  Procedure Laterality Date  . BREAST SURGERY Right    lumpectomy - begin  . EYE SURGERY     cataracts  . leg surgeries     fell leg got caught in a step stool and the toilet in the bathroom and crushed leg  . NECK SURGERY     lypoma removed from left side up behind ear (size of hand) beign  . REVERSE SHOULDER ARTHROPLASTY Left 11/09/2017   Procedure: REVERSE LEFT SHOULDER ARTHROPLASTY;  Surgeon: Justice Britain, MD;  Location: [facility];  Service: Orthopedics;  Laterality: Left;  . SHOULDER SURGERY Left    rotator cuff, ball broke   . TONSILLECTOMY      OB History   No obstetric history on file.      Home Medications    Prior to Admission medications   Medication Sig Start Date End Date Taking? Authorizing Provider  aspirin EC 81 MG tablet Take 81 mg by mouth 2 (two) times daily.    [provider]  atorvastatin (LIPITOR) 10 MG tablet Take 10 mg by mouth at bedtime.    [provider]  benzonatate (TESSALON) 100 MG capsule Take 1-2 capsules  (100-200 mg total) by mouth 3 (three) times daily as needed for cough. 04/03/18   Robyn Haber, MD  citalopram (CELEXA) 40 MG tablet Take 1 tablet (40 mg total) by mouth at bedtime. 03/27/18   Vanessa Kick, MD  ELIQUIS 5 MG TABS tablet TK 1 T PO BID 02/26/18   [provider]  Fluticasone-Salmeterol (ADVAIR) 250-50 MCG/DOSE AEPB Inhale 1 puff into the lungs 2 (two) times daily as needed (shortness of breath).    [provider]  gabapentin (NEURONTIN) 600 MG tablet Take 600 mg by mouth at bedtime.    [provider]  levothyroxine (SYNTHROID, LEVOTHROID) 75 MCG tablet Take 1 tablet (75 mcg total) by mouth daily before breakfast. 03/27/18   Vanessa Kick, MD  losartan (COZAAR) 100 MG tablet Take 100 mg by mouth at bedtime.    [provider]  Magnesium 500 MG TABS Take 500 mg by mouth daily.    [provider]  MELATONIN PO Take 1 tablet by mouth at bedtime as needed (sleep).    [provider]  Menthol, Topical Analgesic, (BIOFREEZE EX) Apply 1 patch topically daily as needed (pain).    [provider]  methocarbamol (ROBAXIN) 500 MG tablet Take 1 tablet (500 mg total) by mouth every 8 (eight) hours as needed for muscle spasms. 11/10/17   Shuford, Olivia Mackie, PA-C  metoprolol succinate (TOPROL-XL) 25 MG 24 hr tablet TK 1 T PO QD QPM 03/19/18   [provider]  Multiple Vitamin (MULTIVITAMIN WITH MINERALS) TABS tablet Take 1 tablet by mouth daily.    [provider]  oxyCODONE-acetaminophen (PERCOCET) 5-325 MG tablet Take 1 tablet by mouth every 4 (four) hours as needed for severe pain (max 6 q). 11/10/17   Shuford, Olivia Mackie, PA-C  potassium gluconate (HM POTASSIUM) 595 (99 K) MG TABS tablet Take 595 mg by mouth daily.    [provider]  predniSONE (DELTASONE) 20 MG tablet Two daily with food 04/03/18   Robyn Haber, MD  TURMERIC PO Take 1 capsule by mouth daily.    [provider]    Family  History History reviewed. No pertinent family history.  Social History Social History   Tobacco Use  . Smoking status: Never Smoker  . Smokeless tobacco: Never Used  Substance Use Topics  . Alcohol use: Never    Frequency: Never  . 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 --      Excl. in [place]? --    No data found.  Updated Vital Signs BP (!) 166/64 (BP Location: Right Arm)   Pulse 62   Temp 98.5 F (36.9 C) (Oral)   Resp 16   SpO2 96%    Physical Exam Vitals signs and nursing note reviewed.  Constitutional:      Appearance: Normal appearance.  HENT:     Head: Normocephalic.     Right Ear: Tympanic membrane and external ear normal.     Left Ear: Tympanic membrane and external ear normal.     Nose: Nose normal.     Mouth/Throat:     Mouth: Mucous membranes are moist.  Eyes:     Conjunctiva/sclera: Conjunctivae normal.     Pupils: Pupils are equal, round, and reactive to light.  Neck:     Musculoskeletal: Normal range of motion and neck supple.  Cardiovascular:     Rate and Rhythm: Normal rate and regular rhythm.     Heart sounds: Murmur present.     Comments: I/VI systolic murmur best heard at [facility] Pulmonary:     Effort: Pulmonary effort is normal.     Breath sounds: Wheezing present.  Musculoskeletal: Normal range of motion.  Skin:    General: Skin is warm.  Neurological:     General: No focal deficit present.      Mental Status:  She is alert and oriented to person, place, and time.  Psychiatric:        Mood and Affect: Mood normal.        Behavior: Behavior normal.      UC Treatments / Results  Labs (all labs ordered are listed, but only abnormal results are displayed) Labs Reviewed - No data to display  EKG None  Radiology No results found.  Procedures Procedures (including critical care time)  Medications Ordered in UC Medications - No data to display  Initial Impression / Assessment and Plan / UC Course  I have reviewed the triage vital signs and the nursing notes.  Pertinent labs & imaging results that were available during my care of the patient were reviewed by me and considered in my medical decision making (see chart for details).    Final Clinical Impressions(s) / UC Diagnoses   Final diagnoses:  Bronchitis     Discharge Instructions     The cough derives from the original infection and subsequent disruption of the bronchial lining.    ED Prescriptions    Medication Sig Dispense Auth. Provider   benzonatate (TESSALON) 100 MG capsule Take 1-2 capsules (100-200 mg total) by mouth 3 (three) times daily as needed for cough. 40 capsule Robyn Haber, MD   predniSONE (DELTASONE) 20 MG tablet Two daily with food 10 tablet Robyn Haber, MD     Controlled Substance Prescriptions [place] Controlled Substance Registry consulted? Not Applicable   Robyn Haber, MD 04/03/18 1018

## 2018-04-16 ENCOUNTER — Ambulatory Visit (HOSPITAL_COMMUNITY)
Admission: EM | Admit: 2018-04-16 | Discharge: 2018-04-16 | Disposition: A | Discharge status: Home / Self Care | Payer: Medicare Other | Attending: Family Medicine | Admitting: Family Medicine

## 2018-04-16 ENCOUNTER — Encounter (HOSPITAL_COMMUNITY): Payer: Self-pay | Admitting: Family Medicine

## 2018-04-16 DIAGNOSIS — J22 Unspecified acute lower respiratory infection: Secondary | ICD-10-CM

## 2018-04-16 MED ORDER — HYDROCODONE-HOMATROPINE 5-1.5 MG/5ML PO SYRP: 5.0000 mL | ORAL_SOLUTION | Freq: Four times a day (QID) | ORAL | 0 refills | Status: DC | PRN

## 2018-04-16 MED ORDER — PREDNISONE 10 MG PO TABS: 20.0000 mg | ORAL_TABLET | Freq: Every day | ORAL | 0 refills | Status: AC

## 2018-04-16 MED ORDER — AMOXICILLIN-POT CLAVULANATE 875-125 MG PO TABS: 1.0000 | ORAL_TABLET | Freq: Two times a day (BID) | ORAL | 0 refills | Status: DC

## 2018-04-16 MED ORDER — ALBUTEROL SULFATE HFA 108 (90 BASE) MCG/ACT IN AERS: 1.0000 | INHALATION_SPRAY | Freq: Four times a day (QID) | RESPIRATORY_TRACT | 0 refills | Status: DC | PRN

## 2018-04-16 NOTE — ED Triage Notes (Signed)
Pt presents with complaints of headache, cough, congestion and fatigue x 1 month.

## 2018-04-16 NOTE — Discharge Instructions (Signed)
We will go ahead and treat you for a lower respiratory tract infection.  Augmentin 2 times a day for 7 days.  Prednisone 1 x a day for 5 days.  Albuterol inhaler to use every 6 hours as needed for cough, wheezing, shortness of breath Continue your daily maintenance inhaler Hycodan cough syrup to use as needed at bedtime for cough Follow up as needed for continued or worsening symptoms

## 2018-04-17 NOTE — ED Provider Notes (Addendum)
[place]    CSN: [id number] Arrival date & time: 04/16/18  1611     History   Chief Complaint Chief Complaint  Patient presents with  . Cough    HPI Heather Christensen is a 68 y.o. female.   Patient is a 68 year old female that presents today with complaints of headache, fatigue, cough x 1 month. Her symptoms have been constant and worsening. She has been using mucinex without any relief. She has also tried multiple other OTC meds without relief. She has been prescribed prednisone and tessalon at previous visit which did not help.  She has intermittent low grade fever and chills at times. Denies any recent sick contacts or recent traveling. She is a therapist. This is her 3rd visit in the last month for respiratory symptoms.   ROS per HPI        Past Medical History:  Diagnosis Date  . Anemia    pernicious anemia  . Arthritis   . Asthma    seasonal, advair inhaler  . Complication of anesthesia    slow to wake  . Depression   . Dysrhythmia    palpitations, PVCs, SVT  . Hypertension   . Hypothyroidism   . Thyroid disease     Patient Active Problem List   Diagnosis Date Noted  . S/p reverse total shoulder arthroplasty 11/09/2017    Past Surgical History:  Procedure Laterality Date  . BREAST SURGERY Right    lumpectomy - begin  . EYE SURGERY     cataracts  . leg surgeries     fell leg got caught in a step stool and the toilet in the bathroom and crushed leg  . NECK SURGERY     lypoma removed from left side up behind ear (size of hand) beign  . REVERSE SHOULDER ARTHROPLASTY Left 11/09/2017   Procedure: REVERSE LEFT SHOULDER ARTHROPLASTY;  Surgeon: Justice Britain, MD;  Location: [facility];  Service: Orthopedics;  Laterality: Left;  . SHOULDER SURGERY Left    rotator cuff, ball broke   . TONSILLECTOMY      OB History   No obstetric history on file.      Home Medications    Prior to Admission medications   Medication Sig Start Date End  Date Taking? Authorizing Provider  albuterol (PROVENTIL HFA;VENTOLIN HFA) 108 (90 Base) MCG/ACT inhaler Inhale 1-2 puffs into the lungs every 6 (six) hours as needed for wheezing or shortness of breath. 04/16/18   Loura Halt A, NP  amoxicillin-clavulanate (AUGMENTIN) 875-125 MG tablet Take 1 tablet by mouth every 12 (twelve) hours. 04/16/18   Loura Halt A, NP  aspirin EC 81 MG tablet Take 81 mg by mouth 2 (two) times daily.    [provider]  atorvastatin (LIPITOR) 10 MG tablet Take 10 mg by mouth at bedtime.    [provider]  benzonatate (TESSALON) 100 MG capsule Take 1-2 capsules (100-200 mg total) by mouth 3 (three) times daily as needed for cough. 04/03/18   Robyn Haber, MD  citalopram (CELEXA) 40 MG tablet Take 1 tablet (40 mg total) by mouth at bedtime. 03/27/18   Vanessa Kick, MD  ELIQUIS 5 MG TABS tablet TK 1 T PO BID 02/26/18   [provider]  Fluticasone-Salmeterol (ADVAIR) 250-50 MCG/DOSE AEPB Inhale 1 puff into the lungs 2 (two) times daily as needed (shortness of breath).    [provider]  gabapentin (NEURONTIN) 600 MG tablet Take 600 mg by mouth at bedtime.  [provider]  HYDROcodone-homatropine (HYCODAN) 5-1.5 MG/5ML syrup Take 5 mLs by mouth every 6 (six) hours as needed for cough. 04/16/18   Loura Halt A, NP  levothyroxine (SYNTHROID, LEVOTHROID) 75 MCG tablet Take 1 tablet (75 mcg total) by mouth daily before breakfast. 03/27/18   Vanessa Kick, MD  losartan (COZAAR) 100 MG tablet Take 100 mg by mouth at bedtime.    [provider]  Magnesium 500 MG TABS Take 500 mg by mouth daily.    [provider]  MELATONIN PO Take 1 tablet by mouth at bedtime as needed (sleep).    [provider]  Menthol, Topical Analgesic, (BIOFREEZE EX) Apply 1 patch topically daily as needed (pain).    [provider]  methocarbamol (ROBAXIN) 500 MG tablet Take 1 tablet (500 mg total) by mouth every 8 (eight) hours  as needed for muscle spasms. 11/10/17   Shuford, Olivia Mackie, PA-C  metoprolol succinate (TOPROL-XL) 25 MG 24 hr tablet TK 1 T PO QD QPM 03/19/18   [provider]  Multiple Vitamin (MULTIVITAMIN WITH MINERALS) TABS tablet Take 1 tablet by mouth daily.    [provider]  oxyCODONE-acetaminophen (PERCOCET) 5-325 MG tablet Take 1 tablet by mouth every 4 (four) hours as needed for severe pain (max 6 q). 11/10/17   Shuford, Olivia Mackie, PA-C  potassium gluconate (HM POTASSIUM) 595 (99 K) MG TABS tablet Take 595 mg by mouth daily.    [provider]  predniSONE (DELTASONE) 10 MG tablet Take 2 tablets (20 mg total) by mouth daily for 5 days. 04/16/18 04/21/18  Loura Halt A, NP  TURMERIC PO Take 1 capsule by mouth daily.    [provider]    Family History History reviewed. No pertinent family history.  Social History Social History   Tobacco Use  . Smoking status: Never Smoker  . Smokeless tobacco: Never Used  Substance Use Topics  . Alcohol use: Never    Frequency: Never  . 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 --      Excl. in [place]? --    No data found.  Updated Vital Signs BP (!) 143/73 (BP Location: Left Arm)   Pulse 63   Temp 98.9 F (37.2 C) (Oral)   Resp 18   SpO2 96%   Visual Acuity Right Eye Distance:   Left Eye Distance:   Bilateral Distance:    Right Eye Near:   Left Eye Near:    Bilateral Near:      Physical Exam Constitutional:      General: She is not in acute distress.    Appearance: She is not  ill-appearing, toxic-appearing or diaphoretic.  HENT:     Head: Normocephalic and atraumatic.     Right Ear: Tympanic membrane and ear canal normal.     Left Ear: Tympanic membrane and ear canal normal.     Nose: Congestion and rhinorrhea present.     Right Turbinates: Swollen.     Left Turbinates: Swollen.     Right Sinus: Maxillary sinus tenderness and frontal sinus tenderness present.     Left Sinus: Maxillary sinus tenderness and frontal sinus tenderness present.     Mouth/Throat:     Pharynx: Oropharynx is clear.  Eyes:     Conjunctiva/sclera: Conjunctivae normal.  Neck:     Musculoskeletal: Normal range of motion.  Cardiovascular:     Rate and Rhythm: Normal rate and regular rhythm.     Pulses: Normal pulses.     Heart sounds: Normal heart sounds.  Pulmonary:     Effort: Pulmonary effort is normal.     Comments: Coarse breath sounds throughout lung field Harsh cough.  Musculoskeletal: Normal range of motion.  Lymphadenopathy:     Cervical: No cervical adenopathy.  Skin:    General: Skin is warm and dry.     Findings: No rash.  Psychiatric:        Mood and Affect: Mood normal.      UC Treatments / Results  Labs (all labs ordered are listed, but only abnormal results are displayed) Labs Reviewed - No data to display  EKG None  Radiology No results found.  Procedures Procedures (including critical care time)  Medications Ordered in UC Medications - No data to display  Initial Impression / Assessment and Plan / UC Course  I have reviewed the triage vital signs and the nursing notes.  Pertinent labs & imaging results that were available during my care of the patient were reviewed by me and considered in my medical decision making (see chart for details).     Based on the patients length of symptoms and physical exam I will go ahead and treat for lower  respiratory tract infection and sinus infection Augmentin twice daily for 7 days Prednisone daily for 5 days Albuterol inhaler to use as needed every 6 hours for cough, wheezing, shortness of breath Hycodan cough syrup for bedtime Follow up as needed for continued or worsening symptoms  Final Clinical Impressions(s) / UC Diagnoses   Final diagnoses:  Lower respiratory tract infection     Discharge Instructions     We will go ahead and treat you for a lower respiratory tract infection.  Augmentin 2 times a day for 7 days.  Prednisone 1 x a day for 5 days.  Albuterol inhaler to use every 6 hours as needed for cough, wheezing, shortness of breath Continue your daily maintenance inhaler Hycodan cough syrup to use as needed at bedtime for cough Follow up as needed for continued or worsening symptoms     ED Prescriptions    Medication Sig Dispense Auth. Provider   amoxicillin-clavulanate (AUGMENTIN) 875-125 MG tablet Take 1 tablet by mouth every 12 (twelve) hours. 14 tablet Kayton Dunaj A, NP   HYDROcodone-homatropine (HYCODAN) 5-1.5 MG/5ML syrup Take 5 mLs by mouth every 6 (six) hours as needed for cough. 120 mL Mystique Bjelland A, NP   albuterol (PROVENTIL HFA;VENTOLIN HFA) 108 (90 Base) MCG/ACT inhaler Inhale 1-2 puffs into the lungs every 6 (six) hours as needed for wheezing or shortness of breath. 1 Inhaler [facility] Rudnicki A, NP   predniSONE (DELTASONE) 10  MG tablet Take 2 tablets (20 mg total) by mouth daily for 5 days. 10 tablet Orvan July, NP     Controlled Substance Prescriptions [place] Controlled Substance Registry consulted? no   Orvan July, NP 04/18/18 [id number]    Orvan July, NP 04/18/18 [phone]

## 2018-04-27 LAB — BASIC METABOLIC PANEL
BUN: 17 (ref 4–21)
Creatinine: 0.9 (ref 0.5–1.1)
Glucose: 92
Potassium: 4.8 (ref 3.4–5.3)
Sodium: 144 (ref 137–147)

## 2018-04-27 LAB — CBC AND DIFFERENTIAL
HCT: 42 (ref 36–46)
Hemoglobin: 13 (ref 12.0–16.0)
Platelets: 231 (ref 150–399)
WBC: 4.4

## 2018-04-27 LAB — TSH: TSH: 2.38 (ref 0.41–5.90)

## 2018-04-28 LAB — HEPATIC FUNCTION PANEL
ALT: 17 (ref 7–35)
AST: 19 (ref 13–35)
Alkaline Phosphatase: 63 (ref 25–125)

## 2018-05-11 ENCOUNTER — Encounter: Payer: Self-pay | Admitting: Cardiology

## 2018-05-11 ENCOUNTER — Ambulatory Visit: Payer: Medicare Other | Admitting: Cardiology

## 2018-05-11 ENCOUNTER — Other Ambulatory Visit: Payer: Self-pay | Admitting: Cardiology

## 2018-05-11 VITALS — BP 151/81 | HR 55 | Ht 61.0 in | Wt 197.0 lb

## 2018-05-11 DIAGNOSIS — E039 Hypothyroidism, unspecified: Secondary | ICD-10-CM | POA: Insufficient documentation

## 2018-05-11 DIAGNOSIS — R011 Cardiac murmur, unspecified: Secondary | ICD-10-CM | POA: Insufficient documentation

## 2018-05-11 DIAGNOSIS — I209 Angina pectoris, unspecified: Secondary | ICD-10-CM | POA: Diagnosis not present

## 2018-05-11 DIAGNOSIS — I480 Paroxysmal atrial fibrillation: Secondary | ICD-10-CM | POA: Diagnosis not present

## 2018-05-11 DIAGNOSIS — E669 Obesity, unspecified: Secondary | ICD-10-CM

## 2018-05-11 DIAGNOSIS — E785 Hyperlipidemia, unspecified: Secondary | ICD-10-CM | POA: Insufficient documentation

## 2018-05-11 DIAGNOSIS — I10 Essential (primary) hypertension: Secondary | ICD-10-CM | POA: Diagnosis not present

## 2018-05-11 DIAGNOSIS — R002 Palpitations: Secondary | ICD-10-CM | POA: Insufficient documentation

## 2018-05-11 MED ORDER — AMLODIPINE BESYLATE 5 MG PO TABS: 5.0000 mg | ORAL_TABLET | Freq: Every day | ORAL | 1 refills | Status: DC

## 2018-05-11 MED ORDER — NITROGLYCERIN 0.4 MG SL SUBL: 0.4000 mg | SUBLINGUAL_TABLET | SUBLINGUAL | 2 refills | Status: DC | PRN

## 2018-05-11 NOTE — Progress Notes (Signed)
Subjective:   Heather Christensen, female    DOB: [birth year], 68 y.o.   MRN: [id number]  Patient, No Pcp Per:  Chief Complaint  Patient presents with  . Hypertension    6 week F/U    HPI: Heather Christensen  is a 68 y.o. female  with hypertension, hyperlipidemia, hypothyroidism, originally evaluated by [place] for preoperative cardiac risk stratification for left shoulder arthroscopy on 11/09/2017 that she continues to recover from.  She was noted to have 1% Atrial fibrillation burden on ziopatch performed in [place] in Jan 2019. She had low risk lexiscan nuclear stress test at that time.  Due to her paroxysmal atrial fibrillation and chads vasc score of 3, I had started her on anticoagulation. She is tolerating this well. Patient had complaints of palpitations, fatigue, and chest pressure. She was placed on 2 week event monitor that revealed occasional PAC's. Metoprolol was increased to 50mg  in view of PACs and HTN. Echocardiogram that was performed on 03/28/2018 revealing grade 2 diastolic dysfunction. She does have systolic aortic murmur that is likely a flow murmur. She does have history of anemia.  Her main complaint is chest pressure that occurs a few times a day generally at rest. Episodes occur for a few minutes and then resolve.That radiates through to her back. She has also had some nausea since last night. Palpitations have essentially resolved.    She works as a substance abuse Scientist, physiological and reports that she does have to walk across her campus several times a day that she tolerates fairly well. She does not exercise. She is hoping to lose weight and has recently joined [employer].     Past Medical History:  Diagnosis Date  . Anemia    pernicious anemia  . Arthritis   . Asthma    seasonal, advair inhaler  . Complication of anesthesia    slow to wake  . Depression   . Dysrhythmia    palpitations, PVCs, SVT  . Hypertension   . Hypothyroidism   .  Thyroid disease     Past Surgical History:  Procedure Laterality Date  . BREAST SURGERY Right    lumpectomy - begin  . EYE SURGERY     cataracts  . leg surgeries     fell leg got caught in a step stool and the toilet in the bathroom and crushed leg  . NECK SURGERY     lypoma removed from left side up behind ear (size of hand) beign  . REVERSE SHOULDER ARTHROPLASTY Left 11/09/2017   Procedure: REVERSE LEFT SHOULDER ARTHROPLASTY;  Surgeon: Justice Britain, MD;  Location: [place];  Service: Orthopedics;  Laterality: Left;  . SHOULDER SURGERY Left    rotator cuff, ball broke   . TONSILLECTOMY      History reviewed. No pertinent family history.  Social History   Socioeconomic History  . Marital status: Divorced    Spouse name: Not on file  . Number of children: 1  . Years of education: Not on file  . Highest education level: Not on file  Occupational History  . Not on file  Social Needs  . Financial resource strain: Not on file  . Food insecurity:    Worry: Not on file    Inability: Not on file  . Transportation needs:    Medical: Not on file    Non-medical: Not on file  Tobacco Use  . Smoking status: Former Smoker    Packs/day: 1.00  Years: 10.00    Pack years: 10.00    Types: Cigarettes  . Smokeless tobacco: Never Used  Substance and Sexual Activity  . Alcohol use: Never    Frequency: Never  . 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 Sexual activity: Not on file  Lifestyle  . Physical activity:    Days per week: Not on file    Minutes per session: Not on file  . Stress: Not on file  Relationships  . Social connections:    Talks on phone: Not on file    Gets together: Not on file    Attends religious service: Not on file    Active member of club or organization: Not on file    Attends meetings of clubs or organizations: Not on file    Relationship status: Not on file  . Intimate partner violence:    Fear of current or ex partner: Not on file     Emotionally abused: Not on file    Physically abused: Not on file    Forced sexual activity: Not on file  Other Topics Concern  . Not on file  Social History Narrative  . Not on file    Current Meds  Medication Sig  . albuterol (PROVENTIL HFA;VENTOLIN HFA) 108 (90 Base) MCG/ACT inhaler Inhale 1-2 puffs into the lungs every 6 (six) hours as needed for wheezing or shortness of breath.  Marland Kitchen atorvastatin (LIPITOR) 10 MG tablet Take 10 mg by mouth at bedtime.  . citalopram (CELEXA) 40 MG tablet Take 1 tablet (40 mg total) by mouth at bedtime.  Marland Kitchen ELIQUIS 5 MG TABS tablet TK 1 T PO BID  . Fluticasone-Salmeterol (ADVAIR) 250-50 MCG/DOSE AEPB Inhale 1 puff into the lungs 2 (two) times daily as needed (shortness of breath).  . gabapentin (NEURONTIN) 600 MG tablet Take 600 mg by mouth at bedtime.  Marland Kitchen levothyroxine (SYNTHROID, LEVOTHROID) 75 MCG tablet Take 1 tablet (75 mcg total) by mouth daily before breakfast.  . losartan (COZAAR) 100 MG tablet Take 100 mg by mouth at bedtime.  . Magnesium 500 MG TABS Take 500 mg by mouth daily.  Marland Kitchen MELATONIN PO Take 1 tablet by mouth at bedtime as needed (sleep).  . Menthol, Topical Analgesic, (BIOFREEZE EX) Apply 1 patch topically daily as needed (pain).  . metoprolol succinate (TOPROL-XL) 25 MG 24 hr tablet TK 1 T PO QD QPM  . Multiple Vitamin (MULTIVITAMIN WITH MINERALS) TABS tablet Take 1 tablet by mouth daily.  . TURMERIC PO Take 1 capsule by mouth daily.     Review of Systems  Constitution: Positive for malaise/fatigue. Negative for decreased appetite, weight gain and weight loss.  Eyes: Negative for visual disturbance.  Cardiovascular: Positive for chest pain and leg swelling (chronic). Negative for claudication, dyspnea on exertion, orthopnea, palpitations and syncope.  Respiratory: Negative for hemoptysis and wheezing.   Endocrine: Negative for cold intolerance and heat intolerance.  Hematologic/Lymphatic: Does not bruise/bleed easily.  Skin: Negative  for nail changes.  Musculoskeletal: Positive for joint pain (left knee from previous injury). Negative for muscle weakness and myalgias.  Gastrointestinal: Negative for abdominal pain, change in bowel habit, nausea and vomiting.  Neurological: Negative for difficulty with concentration, dizziness, focal weakness and headaches.  Psychiatric/Behavioral: Negative for altered mental status and suicidal ideas.  All other systems reviewed and are negative.      Objective:     Blood pressure (!) 151/81, pulse (!) 55, height 5\' 1"  (1.549 m), weight  197 lb (89.4 kg), SpO2 97 %.  Echocardiogram  03/16/2018: Left ventricle cavity is normal in size. Mild concentric hypertrophy of the left ventricle. Normal global wall motion. Doppler evidence of grade II (pseudonormal) diastolic dysfunction, elevated LAP. Calculated EF 55%. Left atrial cavity is mildly dilated. at 4.0 cm. Trileaflet aortic valve with mild (Grade I) regurgitation. Structurally normal mitral valve with trace regurgitation. Mild tricuspid regurgitation. No evidence of pulmonary hypertension.  14 day event monitor 02/23/2018-03/08/2018: Dominant rhythm normal sinus. 18 patient triggered events occurred with reported symptoms of chest pain, shortness of breath, rapid/fast heart rate, tiredness/fatigue, that correlated with normal sinus rhythm with occasional PAC. No A. fib or atrial flutter was noted.   10 day event monitor 03/31/2017-04/10/2017:1% atrial fibrillation with a 34 minute episode. Multiple short runs of SVT. Occasional PVCs.   Lexiscan 03/31/2017 at [facility]: Myocardial perfusion imaging is normal. Normal left ventricular systolic function.     Physical Exam  Constitutional: She is oriented to person, place, and time. Vital signs are normal. She appears well-developed and well-nourished.  HENT:  Head: Normocephalic and atraumatic.  Neck: Normal range of motion.  Cardiovascular: Normal rate, regular rhythm and  intact distal pulses.  Murmur heard.  Early systolic murmur is present with a grade of 2/6 at the upper right sternal border. Pulmonary/Chest: Effort normal and breath sounds normal. No accessory muscle usage. No respiratory distress.  Abdominal: Soft. Bowel sounds are normal.  Musculoskeletal: Normal range of motion.  Neurological: She is alert and oriented to person, place, and time.  Skin: Skin is warm and dry.  Vitals reviewed.          Assessment & Recommendations:   1. Angina pectoris ([place]) Patient's symptoms are concerning for angina as chest pain radiates to her back and she is also been having some associated nausea.  She has had negative Lexiscan nuclear stress test at her previous cardiology office in January 2019 that was low risk study.  Has significant risk factors for CAD including hypertension, hyperlipidemia, obesity, and family history of CAD.  I will schedule for coronary CTA for further evaluation.  I have given her a prescription for nitroglycerin and advised how to use.  EKG today did not show any acute abnormalities.  2. Essential hypertension Blood pressure has improved with increasing metoprolol, but continues to be elevated.  I will start on amlodipine 5 mg daily both for hypertension and also for her chest pain.  3. Paroxysmal atrial fibrillation (HCC) Her palpitations have resolved since increasing her metoprolol.  She has not had any known recurrence of atrial fibrillation.  She is currently on anticoagulation in view of her chads Vascor and and is tolerating this well without any bleeding diathesis.  We will continue the same.  4. Obesity (BMI 30-39.9) Patient has recently started weight watchers to help her lose weight.  She is also hoping to start an exercise regimen soon, but advised her to hold off for now until she can have further cardiac evaluation given her recent chest pain episodes.  I will see her back after coronary CTA in 4 weeks for follow-up.   Encouraged her to contact me sooner for any worsening problems.         Jeri Lager, FNP-C [facility], [place] Office: [phone] Fax: [phone]

## 2018-05-14 ENCOUNTER — Ambulatory Visit (HOSPITAL_COMMUNITY)
Admission: EM | Admit: 2018-05-14 | Discharge: 2018-05-14 | Disposition: A | Discharge status: Home / Self Care | Payer: Medicare Other | Attending: Family Medicine | Admitting: Family Medicine

## 2018-05-14 ENCOUNTER — Encounter (HOSPITAL_COMMUNITY): Payer: Self-pay | Admitting: Emergency Medicine

## 2018-05-14 DIAGNOSIS — R69 Illness, unspecified: Secondary | ICD-10-CM

## 2018-05-14 DIAGNOSIS — J111 Influenza due to unidentified influenza virus with other respiratory manifestations: Secondary | ICD-10-CM

## 2018-05-14 MED ORDER — CETIRIZINE HCL 10 MG PO CAPS: 10.0000 mg | ORAL_CAPSULE | Freq: Every day | ORAL | 0 refills | Status: DC

## 2018-05-14 MED ORDER — HYDROCODONE-HOMATROPINE 5-1.5 MG/5ML PO SYRP: 5.0000 mL | ORAL_SOLUTION | Freq: Four times a day (QID) | ORAL | 0 refills | Status: DC | PRN

## 2018-05-14 NOTE — ED Triage Notes (Signed)
Pt c/o cough, congestion, sore throat achy body since Saturday.

## 2018-05-14 NOTE — Discharge Instructions (Signed)
You likely having a viral upper respiratory infection. We recommended symptom control. I expect your symptoms to start improving in the next 1-2 weeks.   1. Take a daily allergy pill/anti-histamine like Zyrtec, Claritin, or Store brand consistently for 2 weeks  2. For congestion you may try an oral decongestant like Mucinex or sudafed. You may also try intranasal flonase nasal spray or saline irrigations (neti pot, sinus cleanse)  3. For your sore throat you may try cepacol lozenges, salt water gargles, throat spray. Treatment of congestion may also help your sore throat.  4. For cough you may try Hycodan at bedtime, over the counter, Delsym,  Robitussen, Mucinex DM  5. Take Tylenol or Ibuprofen to help with pain/inflammation  6. Stay hydrated, drink plenty of fluids to keep throat coated and less irritated  Honey Tea For cough/sore throat try using a honey-based tea. Use 3 teaspoons of honey with juice squeezed from half lemon. Place shaved pieces of ginger into 1/2-1 cup of water and warm over stove top. Then mix the ingredients and repeat every 4 hours as needed.

## 2018-05-15 NOTE — ED Provider Notes (Signed)
[place]    CSN: [id number] Arrival date & time: 05/14/18  1224     History   Chief Complaint Chief Complaint  Patient presents with  . Appointment    1230  . Cough    HPI Heather Christensen is a 68 y.o. female history of hypertension, paroxysmal A. fib, asthma, presenting today for evaluation of URI symptoms.  Patient states that she has had cough, congestion, sore throat.  She has also had body aches, fatigue and associated diarrhea.  Symptoms began Saturday and have been going on for the past 2 days.  She is unsure of any fevers.  He has tried taking Naprosyn as well as Robitussin with minimal relief.  Denies nausea or vomiting.  Notes that she recently had treatment for bronchitis over the past couple of months which finally did begin to calm down after a few rounds of antibiotics.  Denies shortness of breath or difficulty breathing.  Denies chest pain. HPI  Past Medical History:  Diagnosis Date  . Anemia    pernicious anemia  . Arthritis   . Asthma    seasonal, advair inhaler  . Complication of anesthesia    slow to wake  . Depression   . Dysrhythmia    palpitations, PVCs, SVT  . Hypertension   . Hypothyroidism   . Thyroid disease     Patient Active Problem List   Diagnosis Date Noted  . Paroxysmal atrial fibrillation ([place]) 05/11/2018  . Palpitations 05/11/2018  . Hyperlipidemia 05/11/2018  . Systolic murmur 22/29/7989  . Hypothyroid 05/11/2018  . Obesity (BMI 30-39.9) 05/11/2018  . S/p reverse total shoulder arthroplasty 11/09/2017    Past Surgical History:  Procedure Laterality Date  . BREAST SURGERY Right    lumpectomy - begin  . EYE SURGERY     cataracts  . leg surgeries     fell leg got caught in a step stool and the toilet in the bathroom and crushed leg  . NECK SURGERY     lypoma removed from left side up behind ear (size of hand) beign  . REVERSE SHOULDER ARTHROPLASTY Left 11/09/2017   Procedure: REVERSE LEFT SHOULDER  ARTHROPLASTY;  Surgeon: Justice Britain, MD;  Location: [place];  Service: Orthopedics;  Laterality: Left;  . SHOULDER SURGERY Left    rotator cuff, ball broke   . TONSILLECTOMY      OB History   No obstetric history on file.      Home Medications    Prior to Admission medications   Medication Sig Start Date End Date Taking? Authorizing Provider  albuterol (PROVENTIL HFA;VENTOLIN HFA) 108 (90 Base) MCG/ACT inhaler Inhale 1-2 puffs into the lungs every 6 (six) hours as needed for wheezing or shortness of breath. 04/16/18   Loura Halt A, NP  amLODipine (NORVASC) 5 MG tablet TAKE 1 TABLET(5 MG) BY MOUTH DAILY 05/11/18   Adrian Prows, MD  aspirin EC 81 MG tablet Take 81 mg by mouth 2 (two) times daily.    [provider]  atorvastatin (LIPITOR) 10 MG tablet Take 10 mg by mouth at bedtime.    [provider]  Cetirizine HCl 10 MG CAPS Take 1 capsule (10 mg total) by mouth daily for 10 days. 05/14/18 05/24/18  Avneet Ashmore C, PA-C  citalopram (CELEXA) 40 MG tablet Take 1 tablet (40 mg total) by mouth at bedtime. 03/27/18   Vanessa Kick, MD  ELIQUIS 5 MG TABS tablet TK 1 T PO BID 02/26/18   [provider]  Fluticasone-Salmeterol (ADVAIR) 250-50 MCG/DOSE AEPB Inhale 1 puff into the lungs 2 (two) times daily as needed (shortness of breath).    [provider]  gabapentin (NEURONTIN) 600 MG tablet Take 600 mg by mouth at bedtime.    [provider]  HYDROcodone-homatropine (HYCODAN) 5-1.5 MG/5ML syrup Take 5 mLs by mouth every 6 (six) hours as needed for cough. 05/14/18   Vincenta Steffey C, PA-C  levothyroxine (SYNTHROID, LEVOTHROID) 75 MCG tablet Take 1 tablet (75 mcg total) by mouth daily before breakfast. 03/27/18   Vanessa Kick, MD  losartan (COZAAR) 100 MG tablet Take 100 mg by mouth at bedtime.    [provider]  Magnesium 500 MG TABS Take 500 mg by mouth daily.    [provider]  MELATONIN PO Take 1 tablet by mouth at bedtime as needed  (sleep).    [provider]  Menthol, Topical Analgesic, (BIOFREEZE EX) Apply 1 patch topically daily as needed (pain).    [provider]  methocarbamol (ROBAXIN) 500 MG tablet Take 1 tablet (500 mg total) by mouth every 8 (eight) hours as needed for muscle spasms. Patient not taking: Reported on 05/11/2018 11/10/17   Shuford, Olivia Mackie, PA-C  metoprolol succinate (TOPROL-XL) 25 MG 24 hr tablet TK 1 T PO QD QPM 03/19/18   [provider]  Multiple Vitamin (MULTIVITAMIN WITH MINERALS) TABS tablet Take 1 tablet by mouth daily.    [provider]  nitroGLYCERIN (NITROSTAT) 0.4 MG SL tablet Place 1 tablet (0.4 mg total) under the tongue every 5 (five) minutes as needed for up to 30 days for chest pain. 05/11/18 06/10/18  Miquel Dunn, NP  potassium gluconate (HM POTASSIUM) 595 (99 K) MG TABS tablet Take 595 mg by mouth daily.    [provider]  TURMERIC PO Take 1 capsule by mouth daily.    [provider]    Family History No family history on file.  Social History Social History   Tobacco Use  . Smoking status: Former Smoker    Packs/day: 1.00    Years: 10.00    Pack years: 10.00    Types: Cigarettes  . Smokeless tobacco: Never Used  Substance Use Topics  . Alcohol use: Never    Frequency: Never  . 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 Negative for activity change, appetite change, chills and fever.  HENT: Positive for congestion, rhinorrhea and sore throat. Negative for ear pain, sinus pressure and trouble swallowing.   Eyes: Negative for discharge and redness.  Respiratory: Positive for cough. Negative for chest tightness and shortness of breath.   Cardiovascular: Negative for chest pain.  Gastrointestinal: Positive for diarrhea. Negative for abdominal pain,  nausea and vomiting.  Musculoskeletal: Positive for myalgias.  Skin: Negative for rash.  Neurological: Negative for dizziness, light-headedness and headaches.     Physical Exam Triage Vital Signs ED Triage Vitals  Enc Vitals Group     BP 05/14/18 1242 (!) 164/68     Pulse Rate 05/14/18 1242 78     Resp 05/14/18 1242 18     Temp 05/14/18 1242 98.8 F (37.1 C)     Temp Source 05/14/18 1242 Oral     SpO2 05/14/18 1242 97 %     Weight --      Height --  Head Circumference --      Peak Flow --      Pain Score 05/14/18 1244 6     Pain Loc --      Pain Edu? --      Excl. in [place]? --    No data found.  Updated Vital Signs BP (!) 164/68 (BP Location: Left Arm)   Pulse 78   Temp 98.8 F (37.1 C) (Oral)   Resp 18   SpO2 97%   Visual Acuity Right Eye Distance:   Left Eye Distance:   Bilateral Distance:    Right Eye Near:   Left Eye Near:    Bilateral Near:     Physical Exam Vitals signs and nursing note reviewed.  Constitutional:      General: She is not in acute distress.    Appearance: She is well-developed.  HENT:     Head: Normocephalic and atraumatic.     Ears:     Comments: Bilateral ears without tenderness to palpation of external auricle, tragus and mastoid, EAC's without erythema or swelling, TM's with good bony landmarks and cone of light. Non erythematous.     Nose:     Comments: Mildly swollen turbinates with rhinorrhea present bilaterally    Mouth/Throat:     Comments: Oral mucosa pink and moist, no tonsillar enlargement or exudate. Posterior pharynx patent and nonerythematous, no uvula deviation or swelling. Normal phonation.  Eyes:     Conjunctiva/sclera: Conjunctivae normal.  Neck:     Musculoskeletal: Neck supple.  Cardiovascular:     Rate and Rhythm: Normal rate and regular rhythm.     Heart sounds: Murmur present.  Pulmonary:     Effort: Pulmonary effort is normal. No respiratory distress.     Breath sounds: Normal breath sounds.      Comments: Breathing comfortably at rest, CTABL, no wheezing, rales or other adventitious sounds auscultated; coarse harsh cough in room Abdominal:     Palpations: Abdomen is soft.     Tenderness: There is no abdominal tenderness.  Skin:    General: Skin is warm and dry.  Neurological:     Mental Status: She is alert.      UC Treatments / Results  Labs (all labs ordered are listed, but only abnormal results are displayed) Labs Reviewed - No data to display  EKG None  Radiology No results found.  Procedures Procedures (including critical care time)  Medications Ordered in UC Medications - No data to display  Initial Impression / Assessment and Plan / UC Course  I have reviewed the triage vital signs and the nursing notes.  Pertinent labs & imaging results that were available during my care of the patient were reviewed by me and considered in my medical decision making (see chart for details).     URI symptoms x2-3 days, vital signs stable.  Associated GI symptoms, possible influenza.  Will treat symptomatically and supportively as outside of Tamiflu window.  Recommendations below.  Did provide refill of Hycodan cough syrup as she previously had relief with this, she had 2 prescriptions recently for this syrup when she was being treated for bronchitis.  Given nature of her cough heard in room provided this mainly to use at bedtime.  Continue Robitussin during the day.Discussed strict return precautions. Patient verbalized understanding and is agreeable with plan.  Final Clinical Impressions(s) / UC Diagnoses   Final diagnoses:  Influenza-like illness     Discharge Instructions     You likely having a  viral upper respiratory infection. We recommended symptom control. I expect your symptoms to start improving in the next 1-2 weeks.   1. Take a daily allergy pill/anti-histamine like Zyrtec, Claritin, or Store brand consistently for 2 weeks  2. For congestion you may try  an oral decongestant like Mucinex or sudafed. You may also try intranasal flonase nasal spray or saline irrigations (neti pot, sinus cleanse)  3. For your sore throat you may try cepacol lozenges, salt water gargles, throat spray. Treatment of congestion may also help your sore throat.  4. For cough you may try Hycodan at bedtime, over the counter, Delsym,  Robitussen, Mucinex DM  5. Take Tylenol or Ibuprofen to help with pain/inflammation  6. Stay hydrated, drink plenty of fluids to keep throat coated and less irritated  Honey Tea For cough/sore throat try using a honey-based tea. Use 3 teaspoons of honey with juice squeezed from half lemon. Place shaved pieces of ginger into 1/2-1 cup of water and warm over stove top. Then mix the ingredients and repeat every 4 hours as needed.   ED Prescriptions    Medication Sig Dispense Auth. Provider   HYDROcodone-homatropine (HYCODAN) 5-1.5 MG/5ML syrup Take 5 mLs by mouth every 6 (six) hours as needed for cough. 60 mL Keiosha Cancro C, PA-C   Cetirizine HCl 10 MG CAPS Take 1 capsule (10 mg total) by mouth daily for 10 days. 10 capsule Rodric Punch C, PA-C     Controlled Substance Prescriptions [place] Controlled Substance Registry consulted? Yes, I have consulted the [place] Controlled Substances Registry for this patient, and feel the risk/benefit ratio today is favorable for proceeding with this prescription for a controlled substance.   Janith Lima, [place] 05/15/18 [phone]

## 2018-05-23 ENCOUNTER — Other Ambulatory Visit: Payer: Self-pay | Admitting: Cardiology

## 2018-05-23 DIAGNOSIS — I209 Angina pectoris, unspecified: Secondary | ICD-10-CM

## 2018-05-30 ENCOUNTER — Telehealth: Payer: Medicare Other | Admitting: Family

## 2018-05-30 DIAGNOSIS — R059 Cough, unspecified: Secondary | ICD-10-CM

## 2018-05-30 DIAGNOSIS — R05 Cough: Secondary | ICD-10-CM | POA: Diagnosis not present

## 2018-05-30 MED ORDER — AZITHROMYCIN 250 MG PO TABS: ORAL_TABLET | ORAL | 0 refills | Status: DC

## 2018-05-30 MED ORDER — BENZONATATE 100 MG PO CAPS: 100.0000 mg | ORAL_CAPSULE | Freq: Three times a day (TID) | ORAL | 0 refills | Status: DC | PRN

## 2018-05-30 NOTE — Progress Notes (Signed)
We are sorry that you are not feeling well.  Here is how we plan to help!  Based on your presentation I believe you most likely have A cough due to bacteria.  When patients have a fever and a productive cough with a change in color or increased sputum production, we are concerned about bacterial bronchitis.  If left untreated it can progress to pneumonia.  If your symptoms do not improve with your treatment plan it is important that you contact your provider.   I have prescribed Azithromyin 250 mg: two tablets now and then one tablet daily for 4 additonal days    In addition you may use A non-prescription cough medication called Robitussin DAC. Take 2 teaspoons every 8 hours or Delsym: take 2 teaspoons every 12 hours., A non-prescription cough medication called Mucinex DM: take 2 tablets every 12 hours. and A prescription cough medication called Tessalon Perles 100mg . You may take 1-2 capsules every 8 hours as needed for your cough.  Approximately 5 minutes was spent documenting and reviewing patient's chart.    From your responses in the eVisit questionnaire you describe inflammation in the upper respiratory tract which is causing a significant cough.  This is commonly called Bronchitis and has four common causes:    Allergies  Viral Infections  Acid Reflux  Bacterial Infection Allergies, viruses and acid reflux are treated by controlling symptoms or eliminating the cause. An example might be a cough caused by taking certain blood pressure medications. You stop the cough by changing the medication. Another example might be a cough caused by acid reflux. Controlling the reflux helps control the cough.  USE OF BRONCHODILATOR ("RESCUE") INHALERS: There is a risk from using your bronchodilator too frequently.  The risk is that over-reliance on a medication which only relaxes the muscles surrounding the breathing tubes can reduce the effectiveness of medications prescribed to reduce swelling and  congestion of the tubes themselves.  Although you feel brief relief from the bronchodilator inhaler, your asthma may actually be worsening with the tubes becoming more swollen and filled with mucus.  This can delay other crucial treatments, such as oral steroid medications. If you need to use a bronchodilator inhaler daily, several times per day, you should discuss this with your provider.  There are probably better treatments that could be used to keep your asthma under control.     HOME CARE . Only take medications as instructed by your medical team. . Complete the entire course of an antibiotic. . Drink plenty of fluids and get plenty of rest. . Avoid close contacts especially the very young and the elderly . Cover your mouth if you cough or cough into your sleeve. . Always remember to wash your hands . A steam or ultrasonic humidifier can help congestion.   GET HELP RIGHT AWAY IF: . You develop worsening fever. . You become short of breath . You cough up blood. . Your symptoms persist after you have completed your treatment plan MAKE SURE YOU   Understand these instructions.  Will watch your condition.  Will get help right away if you are not doing well or get worse.  Your e-visit answers were reviewed by a board certified advanced clinical practitioner to complete your personal care plan.  Depending on the condition, your plan could have included both over the counter or prescription medications. If there is a problem please reply  once you have received a response from your provider. Your safety is important to [place].  If you have drug allergies check your prescription carefully.    You can use MyChart to ask questions about today's visit, request a non-urgent call back, or ask for a work or school excuse for 24 hours related to this e-Visit. If it has been greater than 24 hours you will need to follow up with your provider, or enter a new e-Visit to address those concerns. You will  get an e-mail in the next two days asking about your experience.  I hope that your e-visit has been valuable and will speed your recovery. Thank you for using e-visits.

## 2018-06-01 ENCOUNTER — Ambulatory Visit (HOSPITAL_COMMUNITY): Payer: Medicare Other

## 2018-06-07 ENCOUNTER — Encounter (HOSPITAL_COMMUNITY): Payer: Self-pay | Admitting: Emergency Medicine

## 2018-06-07 ENCOUNTER — Ambulatory Visit (INDEPENDENT_AMBULATORY_CARE_PROVIDER_SITE_OTHER): Payer: Medicare Other

## 2018-06-07 ENCOUNTER — Ambulatory Visit: Payer: Self-pay | Admitting: *Deleted

## 2018-06-07 ENCOUNTER — Other Ambulatory Visit: Payer: Self-pay

## 2018-06-07 ENCOUNTER — Ambulatory Visit (HOSPITAL_COMMUNITY)
Admission: EM | Admit: 2018-06-07 | Discharge: 2018-06-07 | Disposition: A | Discharge status: Home / Self Care | Payer: Medicare Other | Attending: Family Medicine | Admitting: Family Medicine

## 2018-06-07 DIAGNOSIS — J40 Bronchitis, not specified as acute or chronic: Secondary | ICD-10-CM | POA: Diagnosis not present

## 2018-06-07 DIAGNOSIS — R0602 Shortness of breath: Secondary | ICD-10-CM | POA: Diagnosis not present

## 2018-06-07 DIAGNOSIS — R05 Cough: Secondary | ICD-10-CM

## 2018-06-07 MED ORDER — PREDNISONE 10 MG PO TABS: 20.0000 mg | ORAL_TABLET | Freq: Every day | ORAL | 0 refills | Status: AC

## 2018-06-07 MED ORDER — ALBUTEROL SULFATE HFA 108 (90 BASE) MCG/ACT IN AERS: 1.0000 | INHALATION_SPRAY | Freq: Four times a day (QID) | RESPIRATORY_TRACT | 0 refills | Status: AC | PRN

## 2018-06-07 MED ORDER — MONTELUKAST SODIUM 10 MG PO TABS: 10.0000 mg | ORAL_TABLET | Freq: Every day | ORAL | 1 refills | Status: DC

## 2018-06-07 MED ORDER — HYDROCODONE-HOMATROPINE 5-1.5 MG/5ML PO SYRP: 5.0000 mL | ORAL_SOLUTION | Freq: Four times a day (QID) | ORAL | 0 refills | Status: DC | PRN

## 2018-06-07 NOTE — Discharge Instructions (Addendum)
Your x ray was normal We are going to treat you for bronchitis.   I am changing your allergy medicine from Zyrtec to Singulair.  You  will take this at bedtime Continue the Advair Use the albuterol rescue inhaler as needed for cough, wheezing, shortness of breath Low-dose prednisone daily for the next 5 days Cough syrup sent for worsening cough and resting at nighttime Follow-up with the primary care physician as scheduled on the eighth for further evaluation and management.

## 2018-06-07 NOTE — Telephone Encounter (Signed)
Summary: Clinical Advice    Relation to pt: self  Call back number: [phone] Pharmacy: [facility] [address], [place] AT [address] [place] [phone] ([phone] (Fax)   Reason for call:  Patient experiencing cough, wheezing, 99.00 temp (today), body ache, sore throat for 1 week. Patient has a [place] appointment with Dr. Juleen China for 06/20/2018.      Patient triaged per cough protocol- she was treated with E visit last week, but her symptoms are not better. Patient cough is deep in chest. Patient has started with low grade temperature and fatigue. She doae have appointment at [facility] this morning. Encouraged patient to keep that appointment- she will keep appointment. Reason for Disposition . [1] Continuous (nonstop) coughing interferes with work or school AND [2] no improvement using cough treatment per protocol  Answer Assessment - Initial Assessment Questions 1. ONSET: "When did the cough begin?"      On and off- some time- recently 2 weeks 2. SEVERITY: "How bad is the cough today?"      When ever talking and up and moving 3. RESPIRATORY DISTRESS: "Describe your breathing."      Breathing feels ok- when she breaths deeply coughs 4. FEVER: "Do you have a fever?" If so, ask: "What is your temperature, how was it measured, and when did it start?"     99.3 this morning, oral thermometer 5. HEMOPTYSIS: "Are you coughing up any blood?" If so ask: "How much?" (flecks, streaks, tablespoons, etc.)     Brownish in color 6. TREATMENT: "What have you done so far to treat the cough?" (e.g., meds, fluids, humidifier)     E-visit and had antibiotic, cough medications, patient is using inhalers on regular basis 7. CARDIAC HISTORY: "Do you have any history of heart disease?" (e.g., heart attack, congestive heart failure)      Seeing cardiologist- no diagnosed problems 8. LUNG HISTORY: "Do you have any history of lung disease?"  (e.g., pulmonary  embolus, asthma, emphysema)     asthma 9. PE RISK FACTORS: "Do you have a history of blood clots?" (or: recent major surgery, recent prolonged travel, bedridden)     no 10. OTHER SYMPTOMS: "Do you have any other symptoms? (e.g., runny nose, wheezing, chest pain)       Body aches, nasal congestion, chest pain with coughing 11. PREGNANCY: "Is there any chance you are pregnant?" "When was your last menstrual period?"       n/a 12. TRAVEL: "Have you traveled out of the country in the last month?" (e.g., travel history, exposures)       No travel or known exposure  Protocols used: COUGH - ACUTE NON-PRODUCTIVE-A-AH

## 2018-06-07 NOTE — ED Triage Notes (Signed)
Patient has a cough that worsened over the last week.  Patient has taken antibiotics for this and no improvement.    Patient has cough, nonproductive Mild sob with coughing This morning temp 99.3 and body aches

## 2018-06-10 NOTE — ED Provider Notes (Signed)
[facility]    CSN: [id number] Arrival date & time: 06/07/18  1224     History   Chief Complaint Chief Complaint  Patient presents with  . Cough    HPI Heather Christensen is a 68 y.o. female.   Patient is a 68 year old female with past medical history of anemia, arthritis, asthma, depression, dysrhythmia, hypertension, hypothyroidism. She presents with dry cough that has been constant over the past week.  She has been seen for respiratory type illnesses approximately 6 times in the past couple of months.  The most recent time was an E visit last week where she was treated with a Z-Pak for possible bacterial bronchitis.  She is reporting that this did not improve her symptoms.  She has been using her daily allergy medication, advair and albuterol inhaler without much relief of her symptoms.  The cough is dry.  She is without fever.  She denies any chest pain or shortness of breath.  The cough is worse at night.  No recent sick contacts.  Reports that she is coughing.  ROS per HPI      Past Medical History:  Diagnosis Date  . Anemia    pernicious anemia  . Arthritis   . Asthma    seasonal, advair inhaler  . Complication of anesthesia    slow to wake  . Depression   . Dysrhythmia    palpitations, PVCs, SVT  . Hypertension   . Hypothyroidism   . Thyroid disease     Patient Active Problem List   Diagnosis Date Noted  . Paroxysmal atrial fibrillation ([place]) 05/11/2018  . Palpitations 05/11/2018  . Hyperlipidemia 05/11/2018  . Systolic murmur 76/16/0737  . Hypothyroid 05/11/2018  . Obesity (BMI 30-39.9) 05/11/2018  . S/p reverse total shoulder arthroplasty 11/09/2017    Past Surgical History:  Procedure Laterality Date  . BREAST SURGERY Right    lumpectomy - begin  . EYE SURGERY     cataracts  . leg surgeries     fell leg got caught in a step stool and the toilet in the bathroom and crushed leg  . NECK SURGERY     lypoma removed from left side up  behind ear (size of hand) beign  . REVERSE SHOULDER ARTHROPLASTY Left 11/09/2017   Procedure: REVERSE LEFT SHOULDER ARTHROPLASTY;  Surgeon: Justice Britain, MD;  Location: [facility];  Service: Orthopedics;  Laterality: Left;  . SHOULDER SURGERY Left    rotator cuff, ball broke   . TONSILLECTOMY      OB History   No obstetric history on file.      Home Medications    Prior to Admission medications   Medication Sig Start Date End Date Taking? Authorizing Provider  albuterol (PROVENTIL HFA;VENTOLIN HFA) 108 (90 Base) MCG/ACT inhaler Inhale 1-2 puffs into the lungs every 6 (six) hours as needed for wheezing or shortness of breath. 06/07/18   Ishia Tenorio A, NP  amLODipine (NORVASC) 5 MG tablet TAKE 1 TABLET(5 MG) BY MOUTH DAILY 05/11/18   Adrian Prows, MD  aspirin EC 81 MG tablet Take 81 mg by mouth 2 (two) times daily.    [provider]  atorvastatin (LIPITOR) 10 MG tablet Take 10 mg by mouth at bedtime.    [provider]  azithromycin (ZITHROMAX) 250 MG tablet Take 500 mg once, then 250 mg for four days 05/30/18   Evelina Dun A, FNP  benzonatate (TESSALON PERLES) 100 MG capsule Take 1 capsule (100 mg total) by mouth  3 (three) times daily as needed. 05/30/18   Evelina Dun A, FNP  Cetirizine HCl 10 MG CAPS Take 1 capsule (10 mg total) by mouth daily for 10 days. 05/14/18 05/24/18  Wieters, Hallie C, PA-C  citalopram (CELEXA) 40 MG tablet Take 1 tablet (40 mg total) by mouth at bedtime. 03/27/18   Vanessa Kick, MD  ELIQUIS 5 MG TABS tablet TK 1 T PO BID 02/26/18   [provider]  Fluticasone-Salmeterol (ADVAIR) 250-50 MCG/DOSE AEPB Inhale 1 puff into the lungs 2 (two) times daily as needed (shortness of breath).    [provider]  gabapentin (NEURONTIN) 600 MG tablet Take 600 mg by mouth at bedtime.    [provider]  HYDROcodone-homatropine (HYCODAN) 5-1.5 MG/5ML syrup Take 5 mLs by mouth every 6 (six) hours as needed for cough. 06/07/18   Loura Halt A,  NP  levothyroxine (SYNTHROID, LEVOTHROID) 75 MCG tablet Take 1 tablet (75 mcg total) by mouth daily before breakfast. 03/27/18   Vanessa Kick, MD  losartan (COZAAR) 100 MG tablet Take 100 mg by mouth at bedtime.    [provider]  Magnesium 500 MG TABS Take 500 mg by mouth daily.    [provider]  MELATONIN PO Take 1 tablet by mouth at bedtime as needed (sleep).    [provider]  Menthol, Topical Analgesic, (BIOFREEZE EX) Apply 1 patch topically daily as needed (pain).    [provider]  methocarbamol (ROBAXIN) 500 MG tablet Take 1 tablet (500 mg total) by mouth every 8 (eight) hours as needed for muscle spasms. Patient not taking: Reported on 05/11/2018 11/10/17   Shuford, Olivia Mackie, PA-C  metoprolol succinate (TOPROL-XL) 25 MG 24 hr tablet TK 1 T PO QD QPM 03/19/18   [provider]  montelukast (SINGULAIR) 10 MG tablet Take 1 tablet (10 mg total) by mouth at bedtime. 06/07/18   Orvan July, NP  Multiple Vitamin (MULTIVITAMIN WITH MINERALS) TABS tablet Take 1 tablet by mouth daily.    [provider]  nitroGLYCERIN (NITROSTAT) 0.4 MG SL tablet Place 1 tablet (0.4 mg total) under the tongue every 5 (five) minutes as needed for up to 30 days for chest pain. 05/11/18 06/10/18  Miquel Dunn, NP  potassium gluconate (HM POTASSIUM) 595 (99 K) MG TABS tablet Take 595 mg by mouth daily.    [provider]  predniSONE (DELTASONE) 10 MG tablet Take 2 tablets (20 mg total) by mouth daily for 5 days. 06/07/18 06/12/18  Loura Halt A, NP  TURMERIC PO Take 1 capsule by mouth daily.    [provider]    Family History History reviewed. No pertinent family history.  Social History Social History   Tobacco Use  . Smoking status: Former Smoker    Packs/day: 1.00    Years: 10.00    Pack years: 10.00    Types: Cigarettes  . Smokeless tobacco: Never Used  Substance Use Topics  . Alcohol use: Never    Frequency: Never  . 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 --      Excl. in [place]? --    No data found.  Updated Vital Signs BP (!) 165/69 (BP Location: Right Arm)   Pulse 68   Temp 98.5 F (36.9 C) (Oral) Comment: took aleve this morning  Resp (!) 22   SpO2 99%   Visual Acuity Right Eye Distance:   Left Eye Distance:   Bilateral Distance:    Right Eye Near:   Left Eye Near:    Bilateral Near:     Physical Exam Vitals signs and nursing note reviewed.  Constitutional:      General: She is not in acute distress.    Appearance: Normal appearance. She is not ill-appearing, toxic-appearing or diaphoretic.  HENT:     Head: Normocephalic and atraumatic.     Right Ear: Tympanic membrane and ear canal normal.     Left Ear: Tympanic membrane and ear canal normal.     Nose: Nose normal.     Mouth/Throat:     Pharynx: Oropharynx is clear.  Eyes:     Conjunctiva/sclera: Conjunctivae normal.  Neck:     Musculoskeletal: Normal range of motion.  Cardiovascular:     Rate and Rhythm: Normal rate and regular rhythm.     Pulses: Normal pulses.     Heart sounds: Normal heart sounds.  Pulmonary:     Effort: Pulmonary effort is normal.     Comments: Coarse lung sounds in all fields Musculoskeletal: Normal range of motion.  Skin:    General: Skin is warm and dry.  Neurological:     General: No focal deficit present.     Mental Status: She is alert.  Psychiatric:         Mood and Affect: Mood normal.      UC Treatments / Results  Labs (all labs ordered are listed, but only abnormal results are displayed) Labs Reviewed - No data to display  EKG None  Radiology No results found.  Procedures Procedures (including critical care time)  Medications Ordered in UC Medications - No data to display  Initial Impression / Assessment and Plan / UC Course  I have reviewed the triage vital signs and the nursing notes.  Pertinent labs & imaging results that were available during my care of the patient were reviewed by me and considered in my medical decision making (see chart for details).     Symptoms consistent with bronchitis Or asthma flare.  I believe this may be due to allergies. I also believe patient could benefit from trying a change in her allergy medication to see if this improves her symptoms. We will switch her from Zyrtec to Singulair Treating the bronchitis with prednisone burst and albuterol inhaler.  She can use the Hycodan at night to help with her cough. Recommended she may need to see a specialist at this point to include either an allergist or a pulmonologist. Patient understanding and agrees Final Clinical Impressions(s) / UC Diagnoses   Final diagnoses:  Bronchitis     Discharge Instructions     Your x ray was  normal We are going to treat you for bronchitis.   I am changing your allergy medicine from Zyrtec to Singulair.  You  will take this at bedtime Continue the Advair Use the albuterol rescue inhaler as needed for cough, wheezing, shortness of breath Low-dose prednisone daily for the next 5 days Cough syrup sent for worsening cough and resting at nighttime Follow-up with the primary care physician as scheduled on the eighth for further evaluation and management.      ED Prescriptions    Medication Sig Dispense Auth. Provider   albuterol (PROVENTIL HFA;VENTOLIN HFA) 108 (90 Base) MCG/ACT inhaler Inhale 1-2 puffs  into the lungs every 6 (six) hours as needed for wheezing or shortness of breath. 1 Inhaler Celena Lanius A, NP   montelukast (SINGULAIR) 10 MG tablet Take 1 tablet (10 mg total) by mouth at bedtime. 30 tablet Amariah Kierstead A, NP   predniSONE (DELTASONE) 10 MG tablet Take 2 tablets (20 mg total) by mouth daily for 5 days. 10 tablet Abrian Hanover A, NP   HYDROcodone-homatropine (HYCODAN) 5-1.5 MG/5ML syrup Take 5 mLs by mouth every 6 (six) hours as needed for cough. 120 mL Loura Halt A, NP     Controlled Substance Prescriptions [place] Controlled Substance Registry consulted? no   Orvan July, NP 06/10/18 1020

## 2018-06-13 ENCOUNTER — Other Ambulatory Visit: Payer: Self-pay

## 2018-06-13 ENCOUNTER — Ambulatory Visit (INDEPENDENT_AMBULATORY_CARE_PROVIDER_SITE_OTHER): Payer: Medicare Other | Admitting: Family Medicine

## 2018-06-13 ENCOUNTER — Encounter: Payer: Self-pay | Admitting: Family Medicine

## 2018-06-13 DIAGNOSIS — I10 Essential (primary) hypertension: Secondary | ICD-10-CM

## 2018-06-13 DIAGNOSIS — E039 Hypothyroidism, unspecified: Secondary | ICD-10-CM | POA: Diagnosis not present

## 2018-06-13 DIAGNOSIS — J302 Other seasonal allergic rhinitis: Secondary | ICD-10-CM

## 2018-06-13 DIAGNOSIS — J4541 Moderate persistent asthma with (acute) exacerbation: Secondary | ICD-10-CM

## 2018-06-13 DIAGNOSIS — R202 Paresthesia of skin: Secondary | ICD-10-CM

## 2018-06-13 DIAGNOSIS — E782 Mixed hyperlipidemia: Secondary | ICD-10-CM

## 2018-06-13 DIAGNOSIS — D510 Vitamin B12 deficiency anemia due to intrinsic factor deficiency: Secondary | ICD-10-CM

## 2018-06-13 DIAGNOSIS — Z87891 Personal history of nicotine dependence: Secondary | ICD-10-CM

## 2018-06-13 DIAGNOSIS — I480 Paroxysmal atrial fibrillation: Secondary | ICD-10-CM | POA: Diagnosis not present

## 2018-06-13 DIAGNOSIS — F341 Dysthymic disorder: Secondary | ICD-10-CM

## 2018-06-13 DIAGNOSIS — E669 Obesity, unspecified: Secondary | ICD-10-CM | POA: Diagnosis not present

## 2018-06-13 MED ORDER — PREDNISONE 5 MG PO TABS: ORAL_TABLET | ORAL | 0 refills | Status: DC

## 2018-06-13 MED ORDER — CITALOPRAM HYDROBROMIDE 40 MG PO TABS: 40.0000 mg | ORAL_TABLET | Freq: Every day | ORAL | 1 refills | Status: DC

## 2018-06-13 MED ORDER — FLUTICASONE PROPIONATE 50 MCG/ACT NA SUSP: 2.0000 | Freq: Every day | NASAL | 6 refills | Status: AC

## 2018-06-13 MED ORDER — "SYRINGE 25G X 1"" 3 ML MISC": 1.0000 "application " | 0 refills | Status: DC

## 2018-06-13 MED ORDER — CYANOCOBALAMIN 1000 MCG/ML IJ SOLN: INTRAMUSCULAR | 0 refills | Status: AC

## 2018-06-13 MED ORDER — LEVOTHYROXINE SODIUM 75 MCG PO TABS: 75.0000 ug | ORAL_TABLET | Freq: Every day | ORAL | 1 refills | Status: DC

## 2018-06-13 NOTE — Progress Notes (Signed)
Virtual Visit via Video   I connected with Heather Christensen on 06/17/18 at  9:40 AM EDT by a video enabled telemedicine application and verified that I am speaking with the correct person using two identifiers. Location patient: Home Location provider: [facility], Office Persons participating in the virtual visit: Korra Garrett Bowring, [name], DO Lonell Grandchild, CMA acting as scribe for Dr. [name].   I discussed the limitations of evaluation and management by telemedicine and the availability of in person appointments. The patient expressed understanding and agreed to proceed.  Subjective:   HPI: Patient sent to our office to establish care. She has been referred by a friend. She has been seen at urgent care until now. She is fairly new to the area. She has been diagnosed with asthma from last visit with urgent care. She had been started on antibiotics, and singular. She does have some Flonase that she will start on.   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 She has history of alcohol abuse but has been sober for over 30 years.     Anemia: She has received b-12 injections in the past. She is not currently taking. Would like to start back on. She has had some numbness in hands and feet, as well as  Muscle aches and fatigue.   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 She is not on any post coital antibiotics.    Reviewed all precautions and expectations with prevention of Covid-19  ROS: See pertinent positives and negatives per HPI.  Patient Active Problem List   Diagnosis Date Noted  . Paresthesias 06/17/2018  . HTN (hypertension) 06/17/2018  . Moderate persistent asthma with acute exacerbation 06/17/2018  . Dysthymia 06/17/2018  . Seasonal allergies 06/17/2018  . Former smoker 06/17/2018  . Pernicious anemia 06/17/2018  . Paroxysmal atrial fibrillation ([place]) 05/11/2018  . Palpitations  05/11/2018  . Hyperlipidemia 05/11/2018  . Systolic murmur 84/69/6295  . Hypothyroid 05/11/2018  . Obesity (BMI 30-39.9) 05/11/2018  . S/p reverse total shoulder arthroplasty 11/09/2017    Social History   Tobacco Use  . Smoking status: Former Smoker    Packs/day: 1.00    Years: 10.00    Pack years: 10.00    Types: Cigarettes  . Smokeless tobacco: Never Used  Substance Use Topics  . Alcohol use: Never    Frequency: Never   Current Outpatient Medications:  .  albuterol (PROVENTIL HFA;VENTOLIN HFA) 108 (90 Base) MCG/ACT inhaler, Inhale 1-2 puffs into the lungs every 6 (six) hours as needed for wheezing or shortness of breath., Disp: 1 Inhaler, Rfl: 0 .  amLODipine (NORVASC) 5 MG tablet, TAKE 1 TABLET(5 MG) BY MOUTH DAILY, Disp: 90 tablet, Rfl: 0 .  aspirin EC 81 MG tablet, Take 81 mg by mouth 2 (two) times daily., Disp: , Rfl:  .  atorvastatin (LIPITOR) 10 MG tablet, Take 10 mg by mouth at bedtime., Disp: , Rfl:  .  azithromycin (ZITHROMAX) 250 MG tablet, Take 500 mg once, then 250 mg for four days, Disp: 6 tablet, Rfl: 0 .  benzonatate (TESSALON PERLES) 100 MG capsule, Take 1 capsule (100 mg total) by mouth 3 (three) times daily as needed., Disp: 20 capsule, Rfl: 0 .  Cetirizine HCl 10 MG CAPS, Take 1 capsule (10 mg total) by mouth daily for 10 days., Disp: 10 capsule, Rfl: 0 .  citalopram (CELEXA) 40 MG tablet, Take 1 tablet (40 mg total) by mouth  at bedtime., Disp: 30 tablet, Rfl: 2 .  ELIQUIS 5 MG TABS tablet, TK 1 T PO BID, Disp: , Rfl:  .  Fluticasone-Salmeterol (ADVAIR) 250-50 MCG/DOSE AEPB, Inhale 1 puff into the lungs 2 (two) times daily as needed (shortness of breath)., Disp: , Rfl:  .  gabapentin (NEURONTIN) 600 MG tablet, Take 600 mg by mouth at bedtime., Disp: , Rfl:  .  HYDROcodone-homatropine (HYCODAN) 5-1.5 MG/5ML syrup, Take 5 mLs by mouth every 6 (six) hours as needed for cough., Disp: 120 mL, Rfl: 0 .  levothyroxine (SYNTHROID, LEVOTHROID) 75 MCG tablet, Take 1  tablet (75 mcg total) by mouth daily before breakfast., Disp: 30 tablet, Rfl: 2 .  losartan (COZAAR) 100 MG tablet, Take 100 mg by mouth at bedtime., Disp: , Rfl:  .  Magnesium 500 MG TABS, Take 500 mg by mouth daily., Disp: , Rfl:  .  MELATONIN PO, Take 1 tablet by mouth at bedtime as needed (sleep)., Disp: , Rfl:  .  Menthol, Topical Analgesic, (BIOFREEZE EX), Apply 1 patch topically daily as needed (pain)., Disp: , Rfl:  .  methocarbamol (ROBAXIN) 500 MG tablet, Take 1 tablet (500 mg total) by mouth every 8 (eight) hours as needed for muscle spasms. (Patient not taking: Reported on 05/11/2018), Disp: 30 tablet, Rfl: 1 .  metoprolol succinate (TOPROL-XL) 25 MG 24 hr tablet, TK 1 T PO QD QPM, Disp: , Rfl:  .  montelukast (SINGULAIR) 10 MG tablet, Take 1 tablet (10 mg total) by mouth at bedtime., Disp: 30 tablet, Rfl: 1 .  Multiple Vitamin (MULTIVITAMIN WITH MINERALS) TABS tablet, Take 1 tablet by mouth daily., Disp: , Rfl:  .  nitroGLYCERIN (NITROSTAT) 0.4 MG SL tablet, Place 1 tablet (0.4 mg total) under the tongue every 5 (five) minutes as needed for up to 30 days for chest pain., Disp: 25 tablet, Rfl: 2 .  potassium gluconate (HM POTASSIUM) 595 (99 K) MG TABS tablet, Take 595 mg by mouth daily., Disp: , Rfl:  .  TURMERIC PO, Take 1 capsule by mouth daily., Disp: , Rfl:   Allergies  Allergen Reactions  . Sulfa Antibiotics Other (See Comments)    STEVENS JOHNSON  . Sulfamethoxazole Other (See Comments)    STEVENS JOHNSON  . Quinolones Swelling and Other (See Comments)    SWELLING REACTION UNSPECIFIED    . Codeine Nausea And Vomiting    Objective:   VITALS: Per patient if applicable, see vitals. GENERAL: Alert, appears well and in no acute distress. HEENT: Atraumatic, conjunctiva clear, no obvious abnormalities on inspection of external nose and ears. NECK: Normal movements of the head and neck. CARDIOPULMONARY: No increased WOB. Speaking in clear sentences. I:E ratio WNL.  MS: Moves  all visible extremities without noticeable abnormality. PSYCH: Pleasant and cooperative, well-groomed. Speech normal rate and rhythm. Affect is appropriate. Insight and judgement are appropriate. Attention is focused, linear, and appropriate.  NEURO: CN grossly intact. Oriented as arrived to appointment on time with no prompting. Moves both UE equally.  SKIN: No obvious lesions, wounds, erythema, or cyanosis noted on face or hands.  Assessment and Plan:   Heather Christensen was seen today for establish care.  Diagnoses and all orders for this visit:  Mixed hyperlipidemia  Acquired hypothyroidism -     levothyroxine (SYNTHROID, LEVOTHROID) 75 MCG tablet; Take 1 tablet (75 mcg total) by mouth daily before breakfast.  Paroxysmal atrial fibrillation (H[facility])  Obesity (BMI 30-39.9)  Pernicious anemia -     Syringe/Needle, Disp, (SYRINGE 3CC/25GX1") 25G X  1" 3 ML MISC; 1 application by Does not apply route once a week. -     cyanocobalamin (,VITAMIN B-12,) 1000 MCG/ML injection; 1000 mcg (1 mg) injection once per month.  Seasonal allergies -     fluticasone (FLONASE) 50 MCG/ACT nasal spray; Place 2 sprays into both nostrils daily.  Dysthymia -     citalopram (CELEXA) 40 MG tablet; Take 1 tablet (40 mg total) by mouth at bedtime.  Former smoker  Moderate persistent asthma with acute exacerbation -     predniSONE (DELTASONE) 5 MG tablet; 6-5-4-3-2-1-off  Paresthesias  Essential hypertension    . Reviewed expectations re: course of current medical issues. . Discussed self-management of symptoms. . Outlined signs and symptoms indicating need for more acute intervention. . Patient verbalized understanding and all questions were answered. Marland Kitchen Health Maintenance issues including appropriate healthy diet, exercise, and smoking avoidance were discussed with patient. . See orders for this visit as documented in the electronic medical record.  [name], DO 06/17/2018   Records requested if needed.  Time spent: 45 minutes, of which >50% was spent in obtaining information about her symptoms, reviewing her previous labs, evaluations, and treatments, counseling her about her condition (please see the discussed topics above), and developing a plan to further investigate it; she had a number of questions which I addressed.

## 2018-06-13 NOTE — Patient Instructions (Signed)
Methyl folate + is the supplement she reviewed with you.

## 2018-06-15 ENCOUNTER — Ambulatory Visit: Payer: Medicare Other | Admitting: Cardiology

## 2018-06-17 ENCOUNTER — Encounter: Payer: Self-pay | Admitting: Family Medicine

## 2018-06-17 DIAGNOSIS — J302 Other seasonal allergic rhinitis: Secondary | ICD-10-CM | POA: Insufficient documentation

## 2018-06-17 DIAGNOSIS — Z87891 Personal history of nicotine dependence: Secondary | ICD-10-CM | POA: Insufficient documentation

## 2018-06-17 DIAGNOSIS — J4541 Moderate persistent asthma with (acute) exacerbation: Secondary | ICD-10-CM | POA: Insufficient documentation

## 2018-06-17 DIAGNOSIS — I10 Essential (primary) hypertension: Secondary | ICD-10-CM | POA: Insufficient documentation

## 2018-06-17 DIAGNOSIS — R202 Paresthesia of skin: Secondary | ICD-10-CM | POA: Insufficient documentation

## 2018-06-17 DIAGNOSIS — D510 Vitamin B12 deficiency anemia due to intrinsic factor deficiency: Secondary | ICD-10-CM | POA: Insufficient documentation

## 2018-06-17 DIAGNOSIS — F341 Dysthymic disorder: Secondary | ICD-10-CM | POA: Insufficient documentation

## 2018-06-18 ENCOUNTER — Encounter: Payer: Self-pay | Admitting: Family Medicine

## 2018-06-19 ENCOUNTER — Other Ambulatory Visit: Payer: Self-pay | Admitting: Cardiology

## 2018-06-20 ENCOUNTER — Encounter

## 2018-06-20 ENCOUNTER — Ambulatory Visit: Payer: Medicare Other | Admitting: Family Medicine

## 2018-06-25 ENCOUNTER — Ambulatory Visit: Payer: Medicare Other | Admitting: Cardiology

## 2018-07-03 ENCOUNTER — Other Ambulatory Visit: Payer: Self-pay | Admitting: Cardiology

## 2018-07-03 ENCOUNTER — Encounter: Payer: Self-pay | Admitting: Family Medicine

## 2018-07-03 NOTE — Telephone Encounter (Signed)
Called patient app made

## 2018-07-03 NOTE — Progress Notes (Signed)
Virtual Visit via Video   I connected with Heather Christensen on 07/06/18 at  8:40 AM EDT by a video enabled telemedicine application and verified that I am speaking with the correct person using two identifiers. Location patient: Home Location provider: [facility], Office Persons participating in the virtual visit: Pria Shannel Zahm, [name], DO Lonell Grandchild, CMA acting as scribe for Dr. [name].   I discussed the limitations of evaluation and management by telemedicine and the availability of in person appointments. The patient expressed understanding and agreed to proceed.  Subjective:   HPI: Patient needs letter to go back to work on Monday. She also has questions about labs that were done at another office listed below. She has follow up with cardiology today as well. Feels much better. Will be isolated at work, will wear mask, will get medical check daily at work.   Reviewed all precautions and expectations with prevention of Covid-19.   Review of Systems  Constitutional: Positive for malaise/fatigue. Negative for chills, fever and weight loss.  Respiratory: Negative for cough, shortness of breath and wheezing.   Cardiovascular: Negative for chest pain, palpitations and leg swelling.  Gastrointestinal: Negative for abdominal pain, constipation, diarrhea, nausea and vomiting.  Genitourinary: Negative for dysuria and urgency.  Musculoskeletal: Negative for joint pain and myalgias.  Skin: Negative for rash.  Neurological: Negative for dizziness and headaches.  Psychiatric/Behavioral: Negative for depression, substance abuse and suicidal ideas. The patient is not nervous/anxious.    Patient Active Problem List   Diagnosis Date Noted  . Paresthesias 06/17/2018  . HTN (hypertension) 06/17/2018  . Moderate persistent asthma with acute exacerbation 06/17/2018  . Dysthymia 06/17/2018  . Seasonal allergies 06/17/2018  . Former smoker 06/17/2018  . Pernicious  anemia 06/17/2018  . Paroxysmal atrial fibrillation ([place]) 05/11/2018  . Palpitations 05/11/2018  . Hyperlipidemia 05/11/2018  . Systolic murmur 59/56/3875  . Hypothyroid 05/11/2018  . Obesity (BMI 30-39.9) 05/11/2018  . S/p reverse total shoulder arthroplasty 11/09/2017    Social History   Tobacco Use  . Smoking status: Former Smoker    Packs/day: 1.00    Years: 10.00    Pack years: 10.00    Types: Cigarettes  . Smokeless tobacco: Never Used  Substance Use Topics  . Alcohol use: Never    Frequency: Never   Current Outpatient Medications:  .  albuterol (PROVENTIL HFA;VENTOLIN HFA) 108 (90 Base) MCG/ACT inhaler, Inhale 1-2 puffs into the lungs every 6 (six) hours as needed for wheezing or shortness of breath., Disp: 1 Inhaler, Rfl: 0 .  amLODipine (NORVASC) 5 MG tablet, TAKE 1 TABLET(5 MG) BY MOUTH DAILY, Disp: 90 tablet, Rfl: 0 .  atorvastatin (LIPITOR) 10 MG tablet, TAKE 1 TABLET BY MOUTH EVERY DAY, Disp: 90 tablet, Rfl: 1 .  benzonatate (TESSALON PERLES) 100 MG capsule, Take 1 capsule (100 mg total) by mouth 3 (three) times daily as needed., Disp: 20 capsule, Rfl: 0 .  Cetirizine HCl 10 MG CAPS, Take 1 capsule (10 mg total) by mouth daily for 10 days., Disp: 10 capsule, Rfl: 0 .  citalopram (CELEXA) 40 MG tablet, Take 1 tablet (40 mg total) by mouth at bedtime., Disp: 90 tablet, Rfl: 1 .  cyanocobalamin (,VITAMIN B-12,) 1000 MCG/ML injection, 1000 mcg (1 mg) injection once per month., Disp: 6 mL, Rfl: 0 .  ELIQUIS 5 MG TABS tablet, TK 1 T PO BID, Disp: , Rfl:  .  fluticasone (FLONASE) 50 MCG/ACT nasal spray, Place 2 sprays into both nostrils  daily., Disp: 16 g, Rfl: 6 .  Fluticasone-Salmeterol (ADVAIR) 250-50 MCG/DOSE AEPB, Inhale 1 puff into the lungs 2 (two) times daily as needed (shortness of breath)., Disp: , Rfl:  .  gabapentin (NEURONTIN) 600 MG tablet, Take 600 mg by mouth at bedtime., Disp: , Rfl:  .  HYDROcodone-homatropine (HYCODAN) 5-1.5 MG/5ML syrup, Take 5 mLs by mouth  every 6 (six) hours as needed for cough., Disp: 120 mL, Rfl: 0 .  levothyroxine (SYNTHROID, LEVOTHROID) 75 MCG tablet, Take 1 tablet (75 mcg total) by mouth daily before breakfast., Disp: 90 tablet, Rfl: 1 .  losartan (COZAAR) 100 MG tablet, Take 100 mg by mouth at bedtime., Disp: , Rfl:  .  MELATONIN PO, Take 1 tablet by mouth at bedtime as needed (sleep)., Disp: , Rfl:  .  Menthol, Topical Analgesic, (BIOFREEZE EX), Apply 1 patch topically daily as needed (pain)., Disp: , Rfl:  .  metoprolol succinate (TOPROL-XL) 25 MG 24 hr tablet, TK 1 T PO QD QPM, Disp: , Rfl:  .  montelukast (SINGULAIR) 10 MG tablet, Take 1 tablet (10 mg total) by mouth at bedtime., Disp: 30 tablet, Rfl: 1 .  Multiple Vitamin (MULTIVITAMIN WITH MINERALS) TABS tablet, Take 1 tablet by mouth daily., Disp: , Rfl:  .  nitroGLYCERIN (NITROSTAT) 0.4 MG SL tablet, Place 1 tablet (0.4 mg total) under the tongue every 5 (five) minutes as needed for up to 30 days for chest pain., Disp: 25 tablet, Rfl: 2 .  potassium gluconate (HM POTASSIUM) 595 (99 K) MG TABS tablet, Take 595 mg by mouth daily., Disp: , Rfl:  .  predniSONE (DELTASONE) 5 MG tablet, 6-5-4-3-2-1-off, Disp: 21 tablet, Rfl: 0 .  Syringe/Needle, Disp, (SYRINGE 3CC/25GX1") 25G X 1" 3 ML MISC, 1 application by Does not apply route once a week., Disp: 12 each, Rfl: 0 .  TURMERIC PO, Take 1 capsule by mouth daily., Disp: , Rfl:   Allergies  Allergen Reactions  . Sulfa Antibiotics Other (See Comments)    STEVENS JOHNSON  . Sulfamethoxazole Other (See Comments)    STEVENS JOHNSON  . Quinolones Swelling and Other (See Comments)    SWELLING REACTION UNSPECIFIED    . Codeine Nausea And Vomiting    Objective:   VITALS: Per patient if applicable, see vitals. GENERAL: Alert, appears well and in no acute distress. HEENT: Atraumatic, conjunctiva clear, no obvious abnormalities on inspection of external nose and ears. NECK: Normal movements of the head and  neck. CARDIOPULMONARY: No increased WOB. Speaking in clear sentences. I:E ratio WNL.  MS: Moves all visible extremities without noticeable abnormality. PSYCH: Pleasant and cooperative, well-groomed. Speech normal rate and rhythm. Affect is appropriate. Insight and judgement are appropriate. Attention is focused, linear, and appropriate.  NEURO: CN grossly intact. Oriented as arrived to appointment on time with no prompting. Moves both UE equally.  SKIN: No obvious lesions, wounds, erythema, or cyanosis noted on face or hands.  Assessment and Plan:   Bennye was seen today for follow-up.  Diagnoses and all orders for this visit:  Malaise and fatigue Comments: Ongoing. She will upload labs from [facility]. Cardiac CT reviewed by me. She will f/u with Cardiology today.  Educated About Covid-19 Virus Infection  Pernicious anemia Comments: B12 supplementation.   Moderate persistent asthma with acute exacerbation Comments: Improved.   . Reviewed expectations re: course of current medical issues. . Discussed self-management of symptoms. . Outlined signs and symptoms indicating need for more acute intervention. . Patient verbalized understanding and all questions  were answered. Marland Kitchen Health Maintenance issues including appropriate healthy diet, exercise, and smoking avoidance were discussed with patient. . See orders for this visit as documented in the electronic medical record.  [name], DO 07/06/2018   Records requested if needed. Time spent: 25 minutes, of which >50% was spent in obtaining information about her symptoms, reviewing her previous labs, evaluations, and treatments, counseling her about her condition (please see the discussed topics above), and developing a plan to further investigate it; she had a number of questions which I addressed.

## 2018-07-04 ENCOUNTER — Telehealth (HOSPITAL_COMMUNITY): Payer: Self-pay | Admitting: Emergency Medicine

## 2018-07-04 NOTE — Telephone Encounter (Signed)
Left message on voicemail with name and callback number Marchia Bond RN Navigator Cardiac Imaging [facility] [phone] Office [phone] Cell  Sending message in [place]

## 2018-07-04 NOTE — Telephone Encounter (Signed)
Pt returning phone call regarding upcoming cardiac imaging study; pt verbalizes understanding of appt date/time, parking situation and where to check in, pre-test NPO status and medications ordered, and verified current allergies; name and call back number provided for further questions should they arise Marchia Bond RN Navigator Cardiac Imaging [place] [facility] [phone] office [phone] cell  Pt denies symptoms of COVID-19 including fever, cough, shortness of breath; pt verbalizes understanding of NO VISITOR policy

## 2018-07-05 ENCOUNTER — Encounter: Payer: Self-pay | Admitting: Family Medicine

## 2018-07-05 ENCOUNTER — Other Ambulatory Visit: Payer: Self-pay

## 2018-07-05 ENCOUNTER — Ambulatory Visit (HOSPITAL_COMMUNITY)
Admission: RE | Admit: 2018-07-05 | Discharge: 2018-07-05 | Disposition: A | Discharge status: Home / Self Care | Payer: Medicare Other | Source: Ambulatory Visit | Attending: Cardiology | Admitting: Cardiology

## 2018-07-05 DIAGNOSIS — I209 Angina pectoris, unspecified: Secondary | ICD-10-CM

## 2018-07-05 LAB — POCT I-STAT CREATININE: Creatinine, Ser: 0.8 mg/dL (ref 0.44–1.00)

## 2018-07-05 MED ORDER — NITROGLYCERIN 0.4 MG SL SUBL
SUBLINGUAL_TABLET | SUBLINGUAL | Status: AC
  Filled 2018-07-05: qty 2

## 2018-07-05 MED ORDER — IOHEXOL 350 MG/ML SOLN
80.0000 mL | Freq: Once | INTRAVENOUS | Status: AC | PRN
  Administered 2018-07-05: 12:00:00 80 mL via INTRAVENOUS

## 2018-07-05 MED ORDER — NITROGLYCERIN 0.4 MG SL SUBL
0.8000 mg | SUBLINGUAL_TABLET | Freq: Once | SUBLINGUAL | Status: AC
  Administered 2018-07-05: 12:00:00 0.8 mg via SUBLINGUAL
  Filled 2018-07-05: qty 25

## 2018-07-06 ENCOUNTER — Encounter: Payer: Self-pay | Admitting: Cardiology

## 2018-07-06 ENCOUNTER — Ambulatory Visit (INDEPENDENT_AMBULATORY_CARE_PROVIDER_SITE_OTHER): Payer: Medicare Other | Admitting: Cardiology

## 2018-07-06 ENCOUNTER — Encounter: Payer: Self-pay | Admitting: Family Medicine

## 2018-07-06 ENCOUNTER — Ambulatory Visit (INDEPENDENT_AMBULATORY_CARE_PROVIDER_SITE_OTHER): Payer: Medicare Other | Admitting: Family Medicine

## 2018-07-06 ENCOUNTER — Other Ambulatory Visit: Payer: Self-pay | Admitting: Cardiology

## 2018-07-06 VITALS — BP 146/77 | Ht 61.0 in | Wt 195.0 lb

## 2018-07-06 VITALS — Ht 61.0 in

## 2018-07-06 DIAGNOSIS — I480 Paroxysmal atrial fibrillation: Secondary | ICD-10-CM

## 2018-07-06 DIAGNOSIS — M25512 Pain in left shoulder: Secondary | ICD-10-CM

## 2018-07-06 DIAGNOSIS — I10 Essential (primary) hypertension: Secondary | ICD-10-CM | POA: Diagnosis not present

## 2018-07-06 DIAGNOSIS — R5383 Other fatigue: Secondary | ICD-10-CM

## 2018-07-06 DIAGNOSIS — D510 Vitamin B12 deficiency anemia due to intrinsic factor deficiency: Secondary | ICD-10-CM | POA: Diagnosis not present

## 2018-07-06 DIAGNOSIS — J4541 Moderate persistent asthma with (acute) exacerbation: Secondary | ICD-10-CM

## 2018-07-06 DIAGNOSIS — I25119 Atherosclerotic heart disease of native coronary artery with unspecified angina pectoris: Secondary | ICD-10-CM | POA: Diagnosis not present

## 2018-07-06 DIAGNOSIS — R5381 Other malaise: Secondary | ICD-10-CM

## 2018-07-06 DIAGNOSIS — Z7189 Other specified counseling: Secondary | ICD-10-CM | POA: Diagnosis not present

## 2018-07-06 MED ORDER — AMLODIPINE BESYLATE 10 MG PO TABS: 10.0000 mg | ORAL_TABLET | Freq: Every day | ORAL | 3 refills | Status: DC

## 2018-07-06 MED ORDER — ISOSORBIDE MONONITRATE ER 30 MG PO TB24: 30.0000 mg | ORAL_TABLET | Freq: Every day | ORAL | 1 refills | Status: DC

## 2018-07-06 NOTE — Patient Instructions (Signed)

## 2018-07-06 NOTE — Addendum Note (Signed)
Addended by: Francella Solian on: 07/06/2018 01:56 PM   Modules accepted: Orders

## 2018-07-06 NOTE — Patient Instructions (Addendum)
Increase amlodipine to 10mg  daily  Start Imdur 30mg  daily  Continue with Metoprolol 50mg  daily  Start Aspirin 81 mg daily due to coronary disease. Watch for bleeding as you are also on Eliquis.  Use nitroglycerin as needed for chest pain. If chest pain does not resolve with 3rd dose, please go to ER. Contact the office if you are having to take nitroglycerin frequently.   Will plan on increasing Lipitor up in view of coronary disease, but as you have not had recent lipid panel will have this performed on Monday fasting at [place]. Will notify you of results.

## 2018-07-06 NOTE — Progress Notes (Addendum)
Subjective:   Heather Christensen, female    DOB: 24-Feb-1951, 68 y.o.   MRN: [id number]  [name], DO:  Chief Complaint  Patient presents with  . Results    cta  . Follow-up    HPI: Heather Christensen  is a 68 y.o. female  with hypertension, hyperlipidemia, hypothyroidism, originally evaluated by [place] for preoperative cardiac risk stratification for left shoulder arthroscopy on 11/09/2017 that she continues to recover from.  She was noted to have 1% Atrial fibrillation burden on ziopatch performed in [place] in Jan 2019. She had low risk lexiscan nuclear stress test at that time.  Due to her paroxysmal atrial fibrillation and chads vasc score of 3, I had started her on anticoagulation. She is tolerating this well.She was placed on 2 week event monitor that revealed occasional PAC's. Metoprolol was increased to 56m in view of PACs and HTN. Echocardiogram that was performed on 03/28/2018 revealing grade 2 diastolic dysfunction. She does have systolic aortic murmur that is likely a flow murmur. She does have history of anemia.  Last seen a few weeks ago for HTN follow up and had symptoms concerning for angina. She was started on amlodipine and given prescription for nitroglycerin. As she had recently had negative nuclear stress test, she was scheduled for Coronary CTA. This was delayed due to COVID restrictions. Underwent Coronary CTA on 07/05/2018 revealing severe obstruction (>70%) in proximal D1, mild to moderate plaque in mid LAD, and calcium score was 276. She now presents to discuss results.  Continues to have chest squeezing sensation that radiates to her back. Resolves with resting. Denies any shortness of breath. Tolerating amlodipine well. Has not used nitroglycerin since seen by me, but has questioned if she should take it on a few occasions.   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 She does not exercise. She is hoping to lose weight and has recently joined [employer]     Past Medical History:  Diagnosis Date  . Alcohol abuse    Sober 340yr  . Anemia    pernicious anemia  . Arthritis   . Asthma    seasonal, advair inhaler  . Complication of anesthesia    slow to wake  . Depression   . Dysrhythmia    palpitations, PVCs, SVT  . Eating disorder   . Heart murmur   . Hypertension   . Hypothyroidism   . Migraines   . Thyroid disease     Past Surgical History:  Procedure Laterality Date  . BREAST SURGERY Right    lumpectomy - begin  . EYE SURGERY     cataracts  . leg surgeries     fell leg got caught in a step stool and the toilet in the bathroom and crushed leg  . NECK SURGERY     lypoma removed from left side up behind ear (size of hand) beign  . REVERSE SHOULDER ARTHROPLASTY Left 11/09/2017   Procedure: REVERSE LEFT SHOULDER ARTHROPLASTY;  Surgeon: SuJustice BritainMD;  [place] [facility] Service: Orthopedics;  Laterality: Left;  . SHOULDER SURGERY Left    rotator cuff, ball broke   . TONSILLECTOMY      Family History  Problem Relation Age of Onset  . Arthritis Mother   . Depression Mother   . Heart disease Mother   . Hypertension Mother   .  Stroke Mother   . Cervical cancer Mother   . Alcohol abuse Father   . Depression Father   . Drug abuse Father   . Heart disease Father   . Hypertension Father   . Lung cancer Father   . Parkinson's disease Sister   . COPD Sister   . Depression Sister   . Heart disease Sister   . Hypertension Sister   . Lung cancer Sister   . Heart disease Brother   . Hypertension Brother     Social History   Socioeconomic History  . Marital status: Soil scientist    Spouse name: Not on file  . Number of children: 1  . Years of education: Not on file  . Highest education level: Not on file  Occupational History    Employer: [employer]  Social Needs  . Financial  resource strain: Not on file  . Food insecurity:    Worry: Not on file    Inability: Not on file  . Transportation needs:    Medical: Not on file    Non-medical: Not on file  Tobacco Use  . Smoking status: Former Smoker    Packs/day: 1.00    Years: 10.00    Pack years: 10.00    Types: Cigarettes    Last attempt to quit: 07/05/1988    Years since quitting: 30.0  . Smokeless tobacco: Never Used  Substance and Sexual Activity  . Alcohol use: Never    Frequency: Never  . 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 Sexual activity: Not on file  Lifestyle  . Physical activity:    Days per week: Not on file    Minutes per session: Not on file  . Stress: Not on file  Relationships  . Social connections:    Talks on phone: Not on file    Gets together: Not on file    Attends religious service: Not on file    Active member of club or organization: Not on file    Attends meetings of clubs or organizations: Not on file    Relationship status: Not on file  . Intimate partner violence:    Fear of current or ex partner: Not on file    Emotionally abused: Not on file    Physically abused: Not on file    Forced sexual activity: Not on file  Other Topics Concern  . 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 She has history of alcohol abuse but has been sober for over 30 years.       Current Meds  Medication Sig  . albuterol (PROVENTIL HFA;VENTOLIN HFA) 108 (90 Base) MCG/ACT inhaler Inhale 1-2 puffs into the lungs every 6 (six) hours as needed for wheezing or shortness of breath.  Marland Kitchen atorvastatin (LIPITOR) 10 MG tablet TAKE 1 TABLET BY MOUTH EVERY DAY  . citalopram (CELEXA) 40 MG tablet Take 1 tablet (40 mg total) by mouth at bedtime.  . cyanocobalamin (,VITAMIN B-12,) 1000 MCG/ML injection 1000 mcg (1 mg) injection once per month.  Arne Cleveland 5 MG TABS tablet daily.   . fluticasone (FLONASE) 50 MCG/ACT  nasal spray Place 2 sprays into both nostrils daily.  . Fluticasone-Salmeterol (ADVAIR) 250-50 MCG/DOSE AEPB Inhale 1 puff into the lungs 2 (two) times daily as needed (shortness of breath).  . gabapentin (NEURONTIN) 600 MG tablet Take 600 mg by mouth at bedtime.  Marland Kitchen  levothyroxine (SYNTHROID, LEVOTHROID) 75 MCG tablet Take 1 tablet (75 mcg total) by mouth daily before breakfast.  . MELATONIN PO Take 1 tablet by mouth at bedtime as needed (sleep).  . Menthol, Topical Analgesic, (BIOFREEZE EX) Apply 1 patch topically daily as needed (pain).  . metoprolol succinate (TOPROL-XL) 50 MG 24 hr tablet TAKE 1 TABLET BY MOUTH  DAILY IN THE EVENING  . montelukast (SINGULAIR) 10 MG tablet Take 1 tablet (10 mg total) by mouth at bedtime.  . Multiple Vitamin (MULTIVITAMIN WITH MINERALS) TABS tablet Take 1 tablet by mouth daily.  . nitroGLYCERIN (NITROSTAT) 0.4 MG SL tablet Place 1 tablet (0.4 mg total) under the tongue every 5 (five) minutes as needed for up to 30 days for chest pain.  . TURMERIC PO Take 1 capsule by mouth daily.  . [DISCONTINUED] amLODipine (NORVASC) 5 MG tablet TAKE 1 TABLET(5 MG) BY MOUTH DAILY     Review of Systems  Constitution: Positive for malaise/fatigue. Negative for decreased appetite, weight gain and weight loss.  Eyes: Negative for visual disturbance.  Cardiovascular: Positive for chest pain. Negative for claudication, dyspnea on exertion, leg swelling (chronic), orthopnea, palpitations and syncope.  Respiratory: Negative for hemoptysis and wheezing.   Endocrine: Negative for cold intolerance and heat intolerance.  Hematologic/Lymphatic: Negative for bleeding problem. Bruises/bleeds easily (some bruising since being on Eliquis).  Skin: Negative for nail changes.  Musculoskeletal: Positive for joint pain (left knee from previous injury). Negative for muscle weakness and myalgias.  Gastrointestinal: Negative for abdominal pain, change in bowel habit, nausea and vomiting.   Neurological: Negative for difficulty with concentration, dizziness, focal weakness and headaches.  Psychiatric/Behavioral: Negative for altered mental status and suicidal ideas.  All other systems reviewed and are negative.      Objective:     Blood pressure (!) 146/77, height '5\' 1"'  (1.549 m), weight 195 lb (88.5 kg).   Coronary CTA with Calcium score 07/05/2018:  1. Coronary calcium score of 276. This was 89th percentile for age and sex matched control. 2.  Normal coronary origin with left dominance. 3.  There is severe (>70%) obstruction in proximal D1. 4.  There is mild to moderate plaque in the mid LAD. 5.  There is non-obstructive plaque in LM. 6.  Recommend cardiac catheterization. 7. Recommend aggressive risk factor reduction and high potency statin.  Echocardiogram  03/16/2018: Left ventricle cavity is normal in size. Mild concentric hypertrophy of the left ventricle. Normal global wall motion. Doppler evidence of grade II (pseudonormal) diastolic dysfunction, elevated LAP. Calculated EF 55%. Left atrial cavity is mildly dilated. at 4.0 cm. Trileaflet aortic valve with mild (Grade I) regurgitation. Structurally normal mitral valve with trace regurgitation. Mild tricuspid regurgitation. No evidence of pulmonary hypertension.  14 day event monitor 02/23/2018-03/08/2018: Dominant rhythm normal sinus. 18 patient triggered events occurred with reported symptoms of chest pain, shortness of breath, rapid/fast heart rate, tiredness/fatigue, that correlated with normal sinus rhythm with occasional PAC. No A. fib or atrial flutter was noted.   10 day event monitor 03/31/2017-04/10/2017:1% atrial fibrillation with a 34 minute episode. Multiple short runs of SVT. Occasional PVCs.   Lexiscan 03/31/2017 at [facility]: Myocardial perfusion imaging is normal. Normal left ventricular systolic function.   Labs at [facility] MD 04/28/2018: Potassium 4.8, eGFR 67/81, Creatinine 0.89,  Calcium 8.6 L, CMP otherwise normal. TSH normal and thyroid panel normal. Normal H and H, MCHC 31.0, CBC otherwise normal.    Physical Exam  Constitutional: She is oriented to person, place, and  time. She appears well-developed and well-nourished. No distress.  Pulmonary/Chest: Effort normal. No respiratory distress.  Neurological: She is alert and oriented to person, place, and time.  Psychiatric: She has a normal mood and affect. Her behavior is normal.  Vitals reviewed.       Assessment & Recommendations:   1. Atherosclerosis of native coronary artery of native heart with angina pectoris [facility]) CTA results were discussed with the patient. Would recommend aggressive medical management and if she continues to have symptoms despite this, will proceed with coronary angiogram for further evaluation. Also in view of current COVID situation. Will increase her amlodipine up to 10 mg daily and start on Imdur 30 mg daily. Has bradycardia on previous EKG; therefore, Metoprolol was not further increased. Advised her to use nitroglycerin as needed. I am sure of her current lipids, will obtain this on Monday. She is on Lipitor 78m daily. Will anticipate increasing dose in view of CAD, but will hold off until lipid results and decide at that time. Advised to start low dose ASA daily for now.   2. Essential hypertension Continues to be elevated. As stated above, amlodipine was increased and added Imdur.   3. Paroxysmal atrial fibrillation (HCC) CHA2DS2/VAS Stroke Risk Points  Current as of 32 minutes ago     4 >= 2 Points: High Risk  1 - 1.99 Points: Medium Risk  0 Points: Low Risk    The previous score was 3 on 05/11/2018.:  Last Change:     Details    This score determines the patient's risk of having a stroke if the  patient has atrial fibrillation.       Points Metrics  0 Has Congestive Heart Failure:  No    Current as of 32 minutes ago  1 Has Vascular Disease:  Yes    Current as of 32  minutes ago  1 Has Hypertension:  Yes    Current as of 32 minutes ago  1 Age:  89   Current as of 32 minutes ago  0 Has Diabetes:  No    Current as of 32 minutes ago  0 Had Stroke:  No  Had TIA:  No  Had thromboembolism:  No    Current as of 32 minutes ago  1 Female:  Yes    Current as of 32 minutes ago    On Eliquis and tolerating well. Does have some mild bruising, which I have advised is to be expected with being on anticoagulant. Advised to watch for major bleeding. Advised of increased risk of bleeding with using ASA; however, in view of CAD and angina symptoms, feel benefit outweigh the risk at this point. Not noted to have A fib on recent event monitor.    Plan: I will schedule for virtual visit in 3 weeks for close monitoring of her symptoms, but encouraged sooner follow up if needed.   AMiquel Dunn MSN, APRN, FNP-C [facility] Center[facility]iovascular. PGlen Echo ParkOffice: [phone]: [phone]

## 2018-07-09 ENCOUNTER — Other Ambulatory Visit: Payer: Self-pay | Admitting: Cardiology

## 2018-07-09 ENCOUNTER — Encounter: Payer: Self-pay | Admitting: Family Medicine

## 2018-07-09 NOTE — Telephone Encounter (Signed)
From pt

## 2018-07-10 ENCOUNTER — Encounter: Payer: Self-pay | Admitting: Family Medicine

## 2018-07-12 ENCOUNTER — Telehealth: Payer: Self-pay | Admitting: Family Medicine

## 2018-07-12 NOTE — Telephone Encounter (Signed)
Pt had an appt for 5/1 with Paulla Fore. Tamala Julian did not have anything for pt until 5/15. Pt is concerned about the amount of pain and would like to know if something can be sent in for this until she is able to see someone, Please advise.

## 2018-07-13 ENCOUNTER — Ambulatory Visit: Payer: Medicare Other | Admitting: Sports Medicine

## 2018-07-16 NOTE — Telephone Encounter (Signed)
Pt calling back and stated that she is still in pain and would like a call back regarding. Please advise.

## 2018-07-16 NOTE — Telephone Encounter (Signed)
Patient was scheduled for 5/5/ at 3pm by Ria Comment. Ria Comment please let Dr. Alcario Drought team know if anything else needs to be done prior to 07/17/18

## 2018-07-17 ENCOUNTER — Ambulatory Visit: Payer: Self-pay

## 2018-07-17 ENCOUNTER — Ambulatory Visit: Payer: Medicare Other | Admitting: Family Medicine

## 2018-07-17 ENCOUNTER — Ambulatory Visit (INDEPENDENT_AMBULATORY_CARE_PROVIDER_SITE_OTHER)
Admission: RE | Admit: 2018-07-17 | Discharge: 2018-07-17 | Disposition: A | Discharge status: Home / Self Care | Payer: Medicare Other | Source: Ambulatory Visit | Attending: Family Medicine | Admitting: Family Medicine

## 2018-07-17 ENCOUNTER — Other Ambulatory Visit: Payer: Self-pay

## 2018-07-17 ENCOUNTER — Encounter: Payer: Self-pay | Admitting: Family Medicine

## 2018-07-17 VITALS — BP 120/68 | HR 81 | Ht 61.0 in

## 2018-07-17 DIAGNOSIS — M542 Cervicalgia: Secondary | ICD-10-CM

## 2018-07-17 DIAGNOSIS — M7530 Calcific tendinitis of unspecified shoulder: Secondary | ICD-10-CM

## 2018-07-17 MED ORDER — VITAMIN D (ERGOCALCIFEROL) 1.25 MG (50000 UNIT) PO CAPS: 50000.0000 [IU] | ORAL_CAPSULE | ORAL | 0 refills | Status: DC

## 2018-07-17 NOTE — Assessment & Plan Note (Signed)
Patient given an injection and tolerated the procedure well.  Patient did have some resolution of pain immediately.  I am concerned that could be a possible brachial neuritis as well.  Taking gabapentin at night.  Encouraged her to potentially take the 600 again.  Discussed with patient about icing regimen, home exercise, x-rays pending.  Work with Product/process development scientist to learn home exercises in greater detail.  Follow-up again in 4 to 6 weeks

## 2018-07-17 NOTE — Patient Instructions (Signed)
Good to see you  Ice 20 minutes 2 times daily. Usually after activity and before bed. Exercises 3 times a week.  pennsaid pinkie amount topically 2 times daily as needed.  Xrays downstairs Increase gabapentin to 450mg  at night  Injected shoulder See me again in 3-4 weeks Be safe

## 2018-07-17 NOTE — Progress Notes (Signed)
[facility] Heather Christensen, [place] [address] Phone: [phone] Subjective:   I Heather Christensen am serving as a Education administrator for Dr. Hulan Saas.   CC: right arm pain   IRJ:[id number]  Heather Christensen is a 68 y.o. female coming in with complaint of right arm pain. Pain starts at the shoulder and radiates down her hand. Neck pain as well. History of reverse shoulder replacement in left arm. Tried to catch herself while slipping on ice.   Onset- Chronic  Character- dull, achy, sharp  Aggravating factors- abduction  Reliving factors-  Therapies tried- Ice, bio freeze, naproxen  Severity- 8/10      Past Medical History:  Diagnosis Date  . Alcohol abuse    Sober 77yrs   . Anemia    pernicious anemia  . Arthritis   . Asthma    seasonal, advair inhaler  . Complication of anesthesia    slow to wake  . Depression   . Dysrhythmia    palpitations, PVCs, SVT  . Eating disorder   . Heart murmur   . Hypertension   . Hypothyroidism   . Migraines   . Thyroid disease    Past Surgical History:  Procedure Laterality Date  . BREAST SURGERY Right 2016   lumpectomy - begin  . EYE SURGERY     cataracts  . leg surgeries     fell leg got caught in a step stool and the toilet in the bathroom and crushed leg  . NECK SURGERY     lypoma removed from left side up behind ear (size of hand) beign  . REVERSE SHOULDER ARTHROPLASTY Left 11/09/2017   Procedure: REVERSE LEFT SHOULDER ARTHROPLASTY;  Surgeon: Justice Britain, MD;  Location: [facility];  Service: Orthopedics;  Laterality: Left;  . SHOULDER SURGERY Left    rotator cuff, ball broke   . TONSILLECTOMY  1958   Social History   Socioeconomic History  . Marital status: Soil scientist    Spouse name: Not on file  . Number of children: 1  . Years of education: Not on file  . Highest education level: Not on file  Occupational History    Employer: [employer]  Social Needs  . Financial resource  strain: Not on file  . Food insecurity:    Worry: Not on file    Inability: Not on file  . Transportation needs:    Medical: Not on file    Non-medical: Not on file  Tobacco Use  . Smoking status: Former Smoker    Packs/day: 1.00    Years: 10.00    Pack years: 10.00    Types: Cigarettes    Last attempt to quit: 07/05/1988    Years since quitting: 30.0  . Smokeless tobacco: Never Used  Substance and Sexual Activity  . Alcohol use: Never    Frequency: Never  . 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 Sexual activity: Yes    Partners: Male  Lifestyle  . Physical activity:    Days per week: Not on file    Minutes per session: Not on file  . Stress: Not on file  Relationships  . Social connections:    Talks on phone: Not on file    Gets together: Not on file    Attends religious service: Not on file    Active member of club or organization: Not on file    Attends meetings of clubs or organizations:  Not on file    Relationship status: Not on file  Other Topics Concern  . 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 She has history of alcohol abuse but has been sober for over 30 years.      Allergies  Allergen Reactions  . Sulfa Antibiotics Other (See Comments)    STEVENS JOHNSON  . Sulfamethoxazole Other (See Comments)    STEVENS JOHNSON  . Quinolones Swelling and Other (See Comments)    SWELLING REACTION UNSPECIFIED    . Codeine Nausea And Vomiting   Family History  Problem Relation Age of Onset  . Arthritis Mother   . Depression Mother   . Heart disease Mother   . Hypertension Mother   . Stroke Mother   . Cervical cancer Mother   . Alcohol abuse Father   . Depression Father   . Drug abuse Father   . Heart disease Father   . Hypertension Father   . Lung cancer Father   . Parkinson's disease Sister   . COPD Sister   . Depression Sister   . Heart disease Sister   .  Hypertension Sister   . Lung cancer Sister   . Heart disease Brother   . Hypertension Brother   . Brain cancer Maternal Grandmother   . Depression Maternal Grandmother   . Heart disease Maternal Grandfather   . Alcohol abuse Paternal Grandmother   . Depression Paternal Grandmother     Current Outpatient Medications (Endocrine & Metabolic):  .  levothyroxine (SYNTHROID, LEVOTHROID) 75 MCG tablet, Take 1 tablet (75 mcg total) by mouth daily before breakfast.  Current Outpatient Medications (Cardiovascular):  .  amLODipine (NORVASC) 10 MG tablet, Take 1 tablet (10 mg total) by mouth daily. Marland Kitchen  atorvastatin (LIPITOR) 10 MG tablet, TAKE 1 TABLET BY MOUTH EVERY DAY .  isosorbide mononitrate (IMDUR) 30 MG 24 hr tablet, TAKE 1 TABLET(30 MG) BY MOUTH DAILY .  metoprolol succinate (TOPROL-XL) 50 MG 24 hr tablet, TAKE 1 TABLET BY MOUTH  DAILY IN THE EVENING .  nitroGLYCERIN (NITROSTAT) 0.4 MG SL tablet, Place 1 tablet (0.4 mg total) under the tongue every 5 (five) minutes as needed for up to 30 days for chest pain.  Current Outpatient Medications (Respiratory):  .  albuterol (PROVENTIL HFA;VENTOLIN HFA) 108 (90 Base) MCG/ACT inhaler, Inhale 1-2 puffs into the lungs every 6 (six) hours as needed for wheezing or shortness of breath. .  fluticasone (FLONASE) 50 MCG/ACT nasal spray, Place 2 sprays into both nostrils daily. .  Fluticasone-Salmeterol (ADVAIR) 250-50 MCG/DOSE AEPB, Inhale 1 puff into the lungs 2 (two) times daily as needed (shortness of breath). .  montelukast (SINGULAIR) 10 MG tablet, Take 1 tablet (10 mg total) by mouth at bedtime.   Current Outpatient Medications (Hematological):  .  cyanocobalamin (,VITAMIN B-12,) 1000 MCG/ML injection, 1000 mcg (1 mg) injection once per month. Arne Cleveland 5 MG TABS tablet, daily.   Current Outpatient Medications (Other):  .  citalopram (CELEXA) 40 MG tablet, Take 1 tablet (40 mg total) by mouth at bedtime. .  gabapentin (NEURONTIN) 600 MG tablet,  Take 600 mg by mouth at bedtime. Marland Kitchen  MELATONIN PO, Take 1 tablet by mouth at bedtime as needed (sleep). .  Menthol, Topical Analgesic, (BIOFREEZE EX), Apply 1 patch topically daily as needed (pain). .  Multiple Vitamin (MULTIVITAMIN WITH MINERALS) TABS tablet, Take 1 tablet by mouth daily. .  TURMERIC PO, Take  1 capsule by mouth daily. .  Vitamin D, Ergocalciferol, (DRISDOL) 1.25 MG (50000 UT) CAPS capsule, Take 1 capsule (50,000 Units total) by mouth every 7 (seven) days.    Past medical history, social, surgical and family history all reviewed in electronic medical record.  No pertanent information unless stated regarding to the chief complaint.   Review of Systems:  No headache, visual changes, nausea, vomiting, diarrhea, constipation, dizziness, abdominal pain, skin rash, fevers, chills, night sweats, weight loss, swollen lymph nodes, body aches, joint swelling, muscle aches, chest pain, shortness of breath, mood changes. + muscle    Objective  Blood pressure 120/68, pulse 81, height 5\' 1"  (1.549 m), SpO2 97 %.    General: No apparent distress alert and oriented x3 mood and affect normal, dressed appropriately.  HEENT: Pupils equal, extraocular movements intact  Respiratory: Patient's speak in full sentences and does not appear short of breath  Cardiovascular: No lower extremity edema, non tender, no erythema  Skin: Warm dry intact with no signs of infection or rash on extremities or on axial skeleton.  Abdomen: Soft nontender  Neuro: Cranial nerves II through XII are intact, neurovascularly intact in all extremities with 2+ DTRs and 2+ pulses.  Lymph: No lymphadenopathy of posterior or anterior cervical chain or axillae bilaterally.  Gait antalgic   MSK:  tender with full range of motion and good stability and symmetric strength and tone of , elbows, wrist, hip, knee and ankles bilaterally.   Neck exam shows loss of lordosis. Shoulder: Right Inspection reveals no abnormalities,  atrophy or asymmetry. Palpation is normal with no tenderness over AC joint or bicipital groove. ROM is full in all planes passively. Rotator cuff strength normal throughout. signs of impingement with positive Neer and Hawkin's tests, but negative empty can sign. Speeds and Yergason's tests normal. No labral pathology noted with negative Obrien's, negative clunk and good stability. Normal scapular function observed. No painful arc and no drop arm sign. No apprehension sign  MSK US performed of: Right This study was ordered, performed, and interpreted by Charlann Boxer D.O.  Shoulder:   Supraspinatus:  Appears normal on long and transverse views, Bursal bulge seen with shoulder abduction on impingement view. Infraspinatus:  Appears normal on long and transverse views. Significant increase in Doppler flow Subscapularis:  Appears normal on long and transverse views. Positive bursa Teres Minor:  Appears normal on long and transverse views. AC joint:  Capsule undistended, no geyser sign. Glenohumeral Joint:  Appears normal without effusion. Glenoid Labrum:  Intact without visualized tears. Biceps Tendon:  Appears normal on long and transverse views, no fraying of tendon, tendon located in intertubercular groove, no subluxation with shoulder internal or external rotation.  Impression: Subacromial bursitis calcific   Procedure: Real-time Ultrasound Guided Injection of right glenohumeral joint Device: GE Logiq E  Ultrasound guided injection is preferred based studies that show increased duration, increased effect, greater accuracy, decreased procedural pain, increased response rate with ultrasound guided versus blind injection.  Verbal informed consent obtained.  Time-out conducted.  Noted no overlying erythema, induration, or other signs of local infection.  Skin prepped in a sterile fashion.  Local anesthesia: Topical Ethyl chloride.  With sterile technique and under real time ultrasound  guidance:  Joint visualized.  23g 1  inch needle inserted posterior approach. Pictures taken for needle placement. Patient did have injection of 2 cc of 1% lidocaine, 2 cc of 0.5% Marcaine, and 1.0 cc of Kenalog 40 mg/dL. Completed without difficulty  Pain immediately resolved  suggesting accurate placement of the medication.  Advised to call if fevers/chills, erythema, induration, drainage, or persistent bleeding.  Images permanently stored and available for review in the ultrasound unit.  Impression: Technically successful ultrasound guided injection.    Impression and Recommendations:     This case required medical decision making of moderate complexity. The above documentation has been reviewed and is accurate and complete Lyndal Pulley, DO       Note: This dictation was prepared with Dragon dictation along with smaller phrase technology. Any transcriptional errors that result from this process are unintentional.

## 2018-07-27 ENCOUNTER — Encounter: Payer: Self-pay | Admitting: Cardiology

## 2018-07-27 ENCOUNTER — Other Ambulatory Visit: Payer: Self-pay

## 2018-07-27 ENCOUNTER — Ambulatory Visit (INDEPENDENT_AMBULATORY_CARE_PROVIDER_SITE_OTHER): Payer: Medicare Other | Admitting: Cardiology

## 2018-07-27 VITALS — BP 116/78 | Ht 61.0 in | Wt 193.0 lb

## 2018-07-27 DIAGNOSIS — I480 Paroxysmal atrial fibrillation: Secondary | ICD-10-CM

## 2018-07-27 DIAGNOSIS — I25119 Atherosclerotic heart disease of native coronary artery with unspecified angina pectoris: Secondary | ICD-10-CM

## 2018-07-27 DIAGNOSIS — I10 Essential (primary) hypertension: Secondary | ICD-10-CM | POA: Diagnosis not present

## 2018-07-27 NOTE — H&P (View-Only) (Signed)
Subjective:   Heather Christensen, female    DOB: [birth year], 68 y.o.   MRN: [id number]  Heather Deutscher, DO:  Chief Complaint  Patient presents with  . Chest Pain    chest pressure    This visit type was conducted due to national recommendations for restrictions regarding the COVID-19 Pandemic (e.g. social distancing).  This format is felt to be most appropriate for this patient at this time.  All issues noted in this document were discussed and addressed.  No physical exam was performed (except for noted visual exam findings with Telehealth visits).  The patient has consented to conduct a Telehealth visit and understands insurance will be billed.   I discussed the limitations of evaluation and management by telemedicine and the availability of in person appointments. The patient expressed understanding and agreed to proceed.  Virtual Visit via Video Note is as below  I connected with [name], on 07/27/18 at 1015 by a video enabled telemedicine application and verified that I am speaking with the correct person using two identifiers.     I have discussed with her regarding the safety during COVID Pandemic and steps and precautions including social distancing with the patient.    HPI: Heather Christensen  is a 68 y.o. female  with hypertension, hyperlipidemia, hypothyroidism, originally evaluated by [place] for preoperative cardiac risk stratification for left shoulder arthroscopy on 11/09/2017 that she continues to recover from.  She was noted to have 1% Atrial fibrillation burden on ziopatch performed in [place] in Jan 2019. She had low risk lexiscan nuclear stress test at that time.  Due to her paroxysmal atrial fibrillation and chads vasc score of 3, I had started her on anticoagulation. She is tolerating this well.She was placed on 2 week event monitor that revealed occasional PAC's. Metoprolol was increased to 56m in view of PACs and HTN. Echocardiogram that was  performed on 03/28/2018 revealing grade 2 diastolic dysfunction. She does have systolic aortic murmur that is likely a flow murmur. She does have history of anemia.  Last seen a few weeks ago for HTN follow up and had symptoms concerning for angina. She was started on amlodipine and given prescription for nitroglycerin. As she had recently had negative nuclear stress test, she was scheduled for Coronary CTA. This was delayed due to COVID restrictions. Underwent Coronary CTA on 07/05/2018 revealing severe obstruction (>70%) in proximal D1, mild to moderate plaque in mid LAD, and calcium score was 276. She was started on Imdur 30 mg daily and amlodipine was increased to 10 mg daily.   She does feel that her chest pain is somewhat better since last seen by me 3 weeks ago. Reports having chest pain one night last week that radiated to her shoulder. Occurred while resting, but was resolved with nitro. She does notice worsening leg swelling since increased dose of amlodipine. She has not had labs performed yet due to fears of COVID exposure at the lab. No bleeding problems.   She works as a sMining engineer She has not been able to exercise yet.     Past Medical History:  Diagnosis Date  . Alcohol abuse    Sober 89yr  . Anemia    pernicious anemia  . Arthritis   . Asthma    seasonal, advair inhaler  . Complication of anesthesia    slow to wake  . Depression   . Dysrhythmia    palpitations, PVCs, SVT  . Eating disorder   .  Heart murmur   . Hypertension   . Hypothyroidism   . Migraines   . Thyroid disease     Past Surgical History:  Procedure Laterality Date  . BREAST SURGERY Right 2016   lumpectomy - begin  . EYE SURGERY     cataracts  . leg surgeries     fell leg got caught in a step stool and the toilet in the bathroom and crushed leg  . NECK SURGERY     lypoma removed from left side up behind ear (size of hand) beign  . REVERSE SHOULDER ARTHROPLASTY Left 11/09/2017    Procedure: REVERSE LEFT SHOULDER ARTHROPLASTY;  Surgeon: Justice Britain, MD;  Location: [facility];  Service: Orthopedics;  Laterality: Left;  . SHOULDER SURGERY Left    rotator cuff, ball broke   . TONSILLECTOMY  1958    Family History  Problem Relation Age of Onset  . Arthritis Mother   . Depression Mother   . Heart disease Mother   . Hypertension Mother   . Stroke Mother   . Cervical cancer Mother   . Alcohol abuse Father   . Depression Father   . Drug abuse Father   . Heart disease Father   . Hypertension Father   . Lung cancer Father   . Parkinson's disease Sister   . COPD Sister   . Depression Sister   . Heart disease Sister   . Hypertension Sister   . Lung cancer Sister   . Heart disease Brother   . Hypertension Brother   . Brain cancer Maternal Grandmother   . Depression Maternal Grandmother   . Heart disease Maternal Grandfather   . Alcohol abuse Paternal Grandmother   . Depression Paternal Grandmother     Social History   Socioeconomic History  . Marital status: Soil scientist    Spouse name: Not on file  . Number of children: 1  . Years of education: Not on file  . Highest education level: Not on file  Occupational History    Employer: [employer]  Social Needs  . Financial resource strain: Not on file  . Food insecurity:    Worry: Not on file    Inability: Not on file  . Transportation needs:    Medical: Not on file    Non-medical: Not on file  Tobacco Use  . Smoking status: Former Smoker    Packs/day: 1.00    Years: 10.00    Pack years: 10.00    Types: Cigarettes    Last attempt to quit: 07/05/1988    Years since quitting: 30.0  . Smokeless tobacco: Never Used  Substance and Sexual Activity  . Alcohol use: Never    Frequency: Never  . 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 Sexual activity: Yes    Partners: Male  Lifestyle  . Physical activity:    Days per week: Not on file    Minutes per session: Not on file  . Stress:  Not on file  Relationships  . Social connections:    Talks on phone: Not on file    Gets together: Not on file    Attends religious service: Not on file    Active member of club or organization: Not on file    Attends meetings of clubs or organizations: Not on file    Relationship status: Not on file  . Intimate partner violence:    Fear of current or ex partner: Not on file  Emotionally abused: Not on file    Physically abused: Not on file    Forced sexual activity: Not on file  Other Topics Concern  . 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 She has history of alcohol abuse but has been sober for over 30 years.       Current Meds  Medication Sig  . albuterol (PROVENTIL HFA;VENTOLIN HFA) 108 (90 Base) MCG/ACT inhaler Inhale 1-2 puffs into the lungs every 6 (six) hours as needed for wheezing or shortness of breath.  Marland Kitchen amLODipine (NORVASC) 10 MG tablet Take 1 tablet (10 mg total) by mouth daily.  Marland Kitchen atorvastatin (LIPITOR) 10 MG tablet TAKE 1 TABLET BY MOUTH EVERY DAY  . citalopram (CELEXA) 40 MG tablet Take 1 tablet (40 mg total) by mouth at bedtime.  . cyanocobalamin (,VITAMIN B-12,) 1000 MCG/ML injection 1000 mcg (1 mg) injection once per month.  Arne Cleveland 5 MG TABS tablet Take 5 mg by mouth daily.   . fluticasone (FLONASE) 50 MCG/ACT nasal spray Place 2 sprays into both nostrils daily.  . Fluticasone-Salmeterol (ADVAIR) 250-50 MCG/DOSE AEPB Inhale 1 puff into the lungs 2 (two) times daily as needed (shortness of breath).  . gabapentin (NEURONTIN) 600 MG tablet Take 600 mg by mouth at bedtime.  . isosorbide mononitrate (IMDUR) 30 MG 24 hr tablet TAKE 1 TABLET(30 MG) BY MOUTH DAILY  . levothyroxine (SYNTHROID, LEVOTHROID) 75 MCG tablet Take 1 tablet (75 mcg total) by mouth daily before breakfast.  . MELATONIN PO Take 1 tablet by mouth at bedtime as needed (sleep).  . metoprolol succinate (TOPROL-XL) 50 MG 24  hr tablet TAKE 1 TABLET BY MOUTH  DAILY IN THE EVENING  . montelukast (SINGULAIR) 10 MG tablet Take 1 tablet (10 mg total) by mouth at bedtime.  . Multiple Vitamin (MULTIVITAMIN WITH MINERALS) TABS tablet Take 1 tablet by mouth daily.  . TURMERIC PO Take 1 capsule by mouth daily.  . Vitamin D, Ergocalciferol, (DRISDOL) 1.25 MG (50000 UT) CAPS capsule Take 1 capsule (50,000 Units total) by mouth every 7 (seven) days.     Review of Systems  Constitution: Positive for malaise/fatigue. Negative for decreased appetite, weight gain and weight loss.  Eyes: Negative for visual disturbance.  Cardiovascular: Positive for chest pain and leg swelling (chronic). Negative for claudication, dyspnea on exertion, orthopnea, palpitations and syncope.  Respiratory: Negative for hemoptysis and wheezing.   Endocrine: Negative for cold intolerance and heat intolerance.  Hematologic/Lymphatic: Negative for bleeding problem. Bruises/bleeds easily (some bruising since being on Eliquis).  Skin: Negative for nail changes.  Musculoskeletal: Positive for joint pain (left knee from previous injury). Negative for muscle weakness and myalgias.  Gastrointestinal: Negative for abdominal pain, change in bowel habit, nausea and vomiting.  Neurological: Negative for difficulty with concentration, dizziness, focal weakness and headaches.  Psychiatric/Behavioral: Negative for altered mental status and suicidal ideas.  All other systems reviewed and are negative.      Objective:     Blood pressure 116/78, height _0  (1.549 m), weight 193 lb (87.5 kg).   Coronary CTA with Calcium score 07/05/2018:  1. Coronary calcium score of 276. This was 89th percentile for age and sex matched control. 2.  Normal coronary origin with left dominance. 3.  There is severe (>70%) obstruction in proximal D1. 4.  There is mild to moderate plaque in the mid LAD. 5.  There is non-obstructive plaque in LM. 6.  Recommend  cardiac  catheterization. 7. Recommend aggressive risk factor reduction and high potency statin.  Echocardiogram  03/16/2018: Left ventricle cavity is normal in size. Mild concentric hypertrophy of the left ventricle. Normal global wall motion. Doppler evidence of grade II (pseudonormal) diastolic dysfunction, elevated LAP. Calculated EF 55%. Left atrial cavity is mildly dilated. at 4.0 cm. Trileaflet aortic valve with mild (Grade I) regurgitation. Structurally normal mitral valve with trace regurgitation. Mild tricuspid regurgitation. No evidence of pulmonary hypertension.  14 day event monitor 02/23/2018-03/08/2018: Dominant rhythm normal sinus. 18 patient triggered events occurred with reported symptoms of chest pain, shortness of breath, rapid/fast heart rate, tiredness/fatigue, that correlated with normal sinus rhythm with occasional PAC. No A. fib or atrial flutter was noted.   10 day event monitor 03/31/2017-04/10/2017:1% atrial fibrillation with a 34 minute episode. Multiple short runs of SVT. Occasional PVCs.   Lexiscan 03/31/2017 at [facility]: Myocardial perfusion imaging is normal. Normal left ventricular systolic function.   Labs at [facility] MD 04/28/2018: Potassium 4.8, eGFR 67/81, Creatinine 0.89, Calcium 8.6 L, CMP otherwise normal. TSH normal and thyroid panel normal. Normal H and H, MCHC 31.0, CBC otherwise normal.    Physical Exam  Constitutional: She is oriented to person, place, and time. She appears well-developed and well-nourished. No distress.  Pulmonary/Chest: Effort normal. No respiratory distress.  Neurological: She is alert and oriented to person, place, and time.  Psychiatric: She has a normal mood and affect. Her behavior is normal.  Vitals reviewed.       Assessment & Recommendations:   1. Atherosclerosis of native coronary artery of native heart with angina pectoris ([place]) Symptoms of chest pain have improved with aggressive medical management. She did  have one episode of chest pain resolved with nitroglycerin. I have advised her to take Imdur in the morning to help prevent chest pain while more active, can consider increasing dose to 60 mg daily if needed. Hopefully she will be able to exercise soon and if she has chest pain limiting activity despite medical management, will proceed with cath. She will have labs performed at PCP office if able to follow up on her lipids. Will consider increasing statin depending upon results.  2. Essential hypertension Improved. Will decrease her dose of amlodipine due to leg swelling. She will continue to monitor.   3. Paroxysmal atrial fibrillation (HCC) On Eliquis and ASA for now in view of CAD. No bleeding reported.     Plan: I will see her back in 6 weeks, but encouraged her to contact me sooner if needed.   Miquel Dunn, MSN, APRN, FNP-C [facility]. [place] Office: [phone] Fax: [phone]

## 2018-07-27 NOTE — Progress Notes (Signed)
Subjective:   Heather Christensen, female    DOB: [birth year], 68 y.o.   MRN: [id number]  Heather Deutscher, DO:  Chief Complaint  Patient presents with  . Chest Pain    chest pressure    This visit type was conducted due to national recommendations for restrictions regarding the COVID-19 Pandemic (e.g. social distancing).  This format is felt to be most appropriate for this patient at this time.  All issues noted in this document were discussed and addressed.  No physical exam was performed (except for noted visual exam findings with Telehealth visits).  The patient has consented to conduct a Telehealth visit and understands insurance will be billed.   I discussed the limitations of evaluation and management by telemedicine and the availability of in person appointments. The patient expressed understanding and agreed to proceed.  Virtual Visit via Video Note is as below  I connected with [name], on 07/27/18 at 1015 by a video enabled telemedicine application and verified that I am speaking with the correct person using two identifiers.     I have discussed with her regarding the safety during COVID Pandemic and steps and precautions including social distancing with the patient.    HPI: Heather Christensen  is a 68 y.o. female  with hypertension, hyperlipidemia, hypothyroidism, originally evaluated by [place] for preoperative cardiac risk stratification for left shoulder arthroscopy on 11/09/2017 that she continues to recover from.  She was noted to have 1% Atrial fibrillation burden on ziopatch performed in [place] in Jan 2019. She had low risk lexiscan nuclear stress test at that time.  Due to her paroxysmal atrial fibrillation and chads vasc score of 3, I had started her on anticoagulation. She is tolerating this well.She was placed on 2 week event monitor that revealed occasional PAC's. Metoprolol was increased to 56m in view of PACs and HTN. Echocardiogram that was  performed on 03/28/2018 revealing grade 2 diastolic dysfunction. She does have systolic aortic murmur that is likely a flow murmur. She does have history of anemia.  Last seen a few weeks ago for HTN follow up and had symptoms concerning for angina. She was started on amlodipine and given prescription for nitroglycerin. As she had recently had negative nuclear stress test, she was scheduled for Coronary CTA. This was delayed due to COVID restrictions. Underwent Coronary CTA on 07/05/2018 revealing severe obstruction (>70%) in proximal D1, mild to moderate plaque in mid LAD, and calcium score was 276. She was started on Imdur 30 mg daily and amlodipine was increased to 10 mg daily.   She does feel that her chest pain is somewhat better since last seen by me 3 weeks ago. Reports having chest pain one night last week that radiated to her shoulder. Occurred while resting, but was resolved with nitro. She does notice worsening leg swelling since increased dose of amlodipine. She has not had labs performed yet due to fears of COVID exposure at the lab. No bleeding problems.   She works as a sMining engineer She has not been able to exercise yet.     Past Medical History:  Diagnosis Date  . Alcohol abuse    Sober 324yr  . Anemia    pernicious anemia  . Arthritis   . Asthma    seasonal, advair inhaler  . Complication of anesthesia    slow to wake  . Depression   . Dysrhythmia    palpitations, PVCs, SVT  . Eating disorder   .  Heart murmur   . Hypertension   . Hypothyroidism   . Migraines   . Thyroid disease     Past Surgical History:  Procedure Laterality Date  . BREAST SURGERY Right 2016   lumpectomy - begin  . EYE SURGERY     cataracts  . leg surgeries     fell leg got caught in a step stool and the toilet in the bathroom and crushed leg  . NECK SURGERY     lypoma removed from left side up behind ear (size of hand) beign  . REVERSE SHOULDER ARTHROPLASTY Left 11/09/2017    Procedure: REVERSE LEFT SHOULDER ARTHROPLASTY;  Surgeon: Justice Britain, MD;  Location: [facility];  Service: Orthopedics;  Laterality: Left;  . SHOULDER SURGERY Left    rotator cuff, ball broke   . TONSILLECTOMY  1958    Family History  Problem Relation Age of Onset  . Arthritis Mother   . Depression Mother   . Heart disease Mother   . Hypertension Mother   . Stroke Mother   . Cervical cancer Mother   . Alcohol abuse Father   . Depression Father   . Drug abuse Father   . Heart disease Father   . Hypertension Father   . Lung cancer Father   . Parkinson's disease Sister   . COPD Sister   . Depression Sister   . Heart disease Sister   . Hypertension Sister   . Lung cancer Sister   . Heart disease Brother   . Hypertension Brother   . Brain cancer Maternal Grandmother   . Depression Maternal Grandmother   . Heart disease Maternal Grandfather   . Alcohol abuse Paternal Grandmother   . Depression Paternal Grandmother     Social History   Socioeconomic History  . Marital status: Soil scientist    Spouse name: Not on file  . Number of children: 1  . Years of education: Not on file  . Highest education level: Not on file  Occupational History    Employer: [employer]  Social Needs  . Financial resource strain: Not on file  . Food insecurity:    Worry: Not on file    Inability: Not on file  . Transportation needs:    Medical: Not on file    Non-medical: Not on file  Tobacco Use  . Smoking status: Former Smoker    Packs/day: 1.00    Years: 10.00    Pack years: 10.00    Types: Cigarettes    Last attempt to quit: 07/05/1988    Years since quitting: 30.0  . Smokeless tobacco: Never Used  Substance and Sexual Activity  . Alcohol use: Never    Frequency: Never  . 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 Sexual activity: Yes    Partners: Male  Lifestyle  . Physical activity:    Days per week: Not on file    Minutes per session: Not on file  . Stress:  Not on file  Relationships  . Social connections:    Talks on phone: Not on file    Gets together: Not on file    Attends religious service: Not on file    Active member of club or organization: Not on file    Attends meetings of clubs or organizations: Not on file    Relationship status: Not on file  . Intimate partner violence:    Fear of current or ex partner: Not on file  Emotionally abused: Not on file    Physically abused: Not on file    Forced sexual activity: Not on file  Other Topics Concern  . 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 She has history of alcohol abuse but has been sober for over 30 years.       Current Meds  Medication Sig  . albuterol (PROVENTIL HFA;VENTOLIN HFA) 108 (90 Base) MCG/ACT inhaler Inhale 1-2 puffs into the lungs every 6 (six) hours as needed for wheezing or shortness of breath.  Marland Kitchen amLODipine (NORVASC) 10 MG tablet Take 1 tablet (10 mg total) by mouth daily.  Marland Kitchen atorvastatin (LIPITOR) 10 MG tablet TAKE 1 TABLET BY MOUTH EVERY DAY  . citalopram (CELEXA) 40 MG tablet Take 1 tablet (40 mg total) by mouth at bedtime.  . cyanocobalamin (,VITAMIN B-12,) 1000 MCG/ML injection 1000 mcg (1 mg) injection once per month.  Arne Cleveland 5 MG TABS tablet Take 5 mg by mouth daily.   . fluticasone (FLONASE) 50 MCG/ACT nasal spray Place 2 sprays into both nostrils daily.  . Fluticasone-Salmeterol (ADVAIR) 250-50 MCG/DOSE AEPB Inhale 1 puff into the lungs 2 (two) times daily as needed (shortness of breath).  . gabapentin (NEURONTIN) 600 MG tablet Take 600 mg by mouth at bedtime.  . isosorbide mononitrate (IMDUR) 30 MG 24 hr tablet TAKE 1 TABLET(30 MG) BY MOUTH DAILY  . levothyroxine (SYNTHROID, LEVOTHROID) 75 MCG tablet Take 1 tablet (75 mcg total) by mouth daily before breakfast.  . MELATONIN PO Take 1 tablet by mouth at bedtime as needed (sleep).  . metoprolol succinate (TOPROL-XL) 50 MG 24  hr tablet TAKE 1 TABLET BY MOUTH  DAILY IN THE EVENING  . montelukast (SINGULAIR) 10 MG tablet Take 1 tablet (10 mg total) by mouth at bedtime.  . Multiple Vitamin (MULTIVITAMIN WITH MINERALS) TABS tablet Take 1 tablet by mouth daily.  . TURMERIC PO Take 1 capsule by mouth daily.  . Vitamin D, Ergocalciferol, (DRISDOL) 1.25 MG (50000 UT) CAPS capsule Take 1 capsule (50,000 Units total) by mouth every 7 (seven) days.     Review of Systems  Constitution: Positive for malaise/fatigue. Negative for decreased appetite, weight gain and weight loss.  Eyes: Negative for visual disturbance.  Cardiovascular: Positive for chest pain and leg swelling (chronic). Negative for claudication, dyspnea on exertion, orthopnea, palpitations and syncope.  Respiratory: Negative for hemoptysis and wheezing.   Endocrine: Negative for cold intolerance and heat intolerance.  Hematologic/Lymphatic: Negative for bleeding problem. Bruises/bleeds easily (some bruising since being on Eliquis).  Skin: Negative for nail changes.  Musculoskeletal: Positive for joint pain (left knee from previous injury). Negative for muscle weakness and myalgias.  Gastrointestinal: Negative for abdominal pain, change in bowel habit, nausea and vomiting.  Neurological: Negative for difficulty with concentration, dizziness, focal weakness and headaches.  Psychiatric/Behavioral: Negative for altered mental status and suicidal ideas.  All other systems reviewed and are negative.      Objective:     Blood pressure 116/78, height _0  (1.549 m), weight 193 lb (87.5 kg).   Coronary CTA with Calcium score 07/05/2018:  1. Coronary calcium score of 276. This was 89th percentile for age and sex matched control. 2.  Normal coronary origin with left dominance. 3.  There is severe (>70%) obstruction in proximal D1. 4.  There is mild to moderate plaque in the mid LAD. 5.  There is non-obstructive plaque in LM. 6.  Recommend  cardiac  catheterization. 7. Recommend aggressive risk factor reduction and high potency statin.  Echocardiogram  03/16/2018: Left ventricle cavity is normal in size. Mild concentric hypertrophy of the left ventricle. Normal global wall motion. Doppler evidence of grade II (pseudonormal) diastolic dysfunction, elevated LAP. Calculated EF 55%. Left atrial cavity is mildly dilated. at 4.0 cm. Trileaflet aortic valve with mild (Grade I) regurgitation. Structurally normal mitral valve with trace regurgitation. Mild tricuspid regurgitation. No evidence of pulmonary hypertension.  14 day event monitor 02/23/2018-03/08/2018: Dominant rhythm normal sinus. 18 patient triggered events occurred with reported symptoms of chest pain, shortness of breath, rapid/fast heart rate, tiredness/fatigue, that correlated with normal sinus rhythm with occasional PAC. No A. fib or atrial flutter was noted.   10 day event monitor 03/31/2017-04/10/2017:1% atrial fibrillation with a 34 minute episode. Multiple short runs of SVT. Occasional PVCs.   Lexiscan 03/31/2017 at [facility]: Myocardial perfusion imaging is normal. Normal left ventricular systolic function.   Labs at [facility] MD 04/28/2018: Potassium 4.8, eGFR 67/81, Creatinine 0.89, Calcium 8.6 L, CMP otherwise normal. TSH normal and thyroid panel normal. Normal H and H, MCHC 31.0, CBC otherwise normal.    Physical Exam  Constitutional: She is oriented to person, place, and time. She appears well-developed and well-nourished. No distress.  Pulmonary/Chest: Effort normal. No respiratory distress.  Neurological: She is alert and oriented to person, place, and time.  Psychiatric: She has a normal mood and affect. Her behavior is normal.  Vitals reviewed.       Assessment & Recommendations:   1. Atherosclerosis of native coronary artery of native heart with angina pectoris ([place]) Symptoms of chest pain have improved with aggressive medical management. She did  have one episode of chest pain resolved with nitroglycerin. I have advised her to take Imdur in the morning to help prevent chest pain while more active, can consider increasing dose to 60 mg daily if needed. Hopefully she will be able to exercise soon and if she has chest pain limiting activity despite medical management, will proceed with cath. She will have labs performed at PCP office if able to follow up on her lipids. Will consider increasing statin depending upon results.  2. Essential hypertension Improved. Will decrease her dose of amlodipine due to leg swelling. She will continue to monitor.   3. Paroxysmal atrial fibrillation (HCC) On Eliquis and ASA for now in view of CAD. No bleeding reported.     Plan: I will see her back in 6 weeks, but encouraged her to contact me sooner if needed.   Miquel Dunn, MSN, APRN, FNP-C [facility]. [place] Office: [phone] Fax: [phone]

## 2018-07-30 ENCOUNTER — Encounter: Payer: Self-pay | Admitting: Family Medicine

## 2018-07-30 NOTE — Telephone Encounter (Signed)
Forwarded message

## 2018-08-01 ENCOUNTER — Other Ambulatory Visit (INDEPENDENT_AMBULATORY_CARE_PROVIDER_SITE_OTHER): Payer: Medicare Other

## 2018-08-01 ENCOUNTER — Other Ambulatory Visit: Payer: Self-pay

## 2018-08-01 ENCOUNTER — Other Ambulatory Visit: Payer: Self-pay | Admitting: Family Medicine

## 2018-08-01 DIAGNOSIS — E7800 Pure hypercholesterolemia, unspecified: Secondary | ICD-10-CM | POA: Diagnosis not present

## 2018-08-01 DIAGNOSIS — I209 Angina pectoris, unspecified: Secondary | ICD-10-CM

## 2018-08-01 DIAGNOSIS — R300 Dysuria: Secondary | ICD-10-CM

## 2018-08-01 LAB — COMPREHENSIVE METABOLIC PANEL
ALT: 20 U/L (ref 0–35)
AST: 22 U/L (ref 0–37)
Albumin: 4.4 g/dL (ref 3.5–5.2)
Alkaline Phosphatase: 55 U/L (ref 39–117)
BUN: 17 mg/dL (ref 6–23)
CO2: 29 mEq/L (ref 19–32)
Calcium: 9.3 mg/dL (ref 8.4–10.5)
Chloride: 104 mEq/L (ref 96–112)
Creatinine, Ser: 0.88 mg/dL (ref 0.40–1.20)
GFR: 64.04 mL/min (ref >60.00)
Glucose, Bld: 100 mg/dL — ABNORMAL HIGH (ref 70–99)
Potassium: 4.1 mEq/L (ref 3.5–5.1)
Sodium: 143 mEq/L (ref 135–145)
Total Bilirubin: 0.5 mg/dL (ref 0.2–1.2)
Total Protein: 6.7 g/dL (ref 6.0–8.3)

## 2018-08-01 LAB — BASIC METABOLIC PANEL
BUN: 17 mg/dL (ref 6–23)
CO2: 28 mEq/L (ref 19–32)
Calcium: 9.3 mg/dL (ref 8.4–10.5)
Chloride: 103 mEq/L (ref 96–112)
Creatinine, Ser: 0.91 mg/dL (ref 0.40–1.20)
GFR: 61.61 mL/min (ref >60.00)
Glucose, Bld: 98 mg/dL (ref 70–99)
Potassium: 4.4 mEq/L (ref 3.5–5.1)
Sodium: 141 mEq/L (ref 135–145)

## 2018-08-01 LAB — URINALYSIS, ROUTINE W REFLEX MICROSCOPIC
Bilirubin Urine: NEGATIVE
Hgb urine dipstick: NEGATIVE
Ketones, ur: NEGATIVE
Leukocytes,Ua: NEGATIVE
Nitrite: NEGATIVE
RBC / HPF: NONE SEEN (ref 0–?)
Specific Gravity, Urine: <=1.005 — AB (ref 1.000–1.030)
Total Protein, Urine: NEGATIVE
Urine Glucose: NEGATIVE
Urobilinogen, UA: 0.2 (ref 0.0–1.0)
pH: 7 (ref 5.0–8.0)

## 2018-08-01 LAB — LIPID PANEL
Cholesterol: 145 mg/dL (ref 0–200)
HDL: 54.7 mg/dL (ref >39.00)
LDL Cholesterol: 74 mg/dL (ref 0–99)
NonHDL: 90.13
Total CHOL/HDL Ratio: 3
Triglycerides: 82 mg/dL (ref 0.0–149.0)
VLDL: 16.4 mg/dL (ref 0.0–40.0)

## 2018-08-01 NOTE — Telephone Encounter (Signed)
Called patient labs placed she will labs today and has f/u later this week.

## 2018-08-02 NOTE — Progress Notes (Signed)
Virtual Visit via Video   Due to the COVID-19 pandemic, this visit was completed with telemedicine (audio/video) technology to reduce patient and provider exposure as well as to preserve personal protective equipment.   I connected with Heather Christensen by a video enabled telemedicine application and verified that I am speaking with the correct person using two identifiers. Location patient: Home Location provider: [facility], Office Persons participating in the virtual visit: Katalyn Burnett Lieber, [name], DO Lonell Grandchild, CMA acting as scribe for Dr. [name].   I discussed the limitations of evaluation and management by telemedicine and the availability of in person appointments. The patient expressed understanding and agreed to proceed.  Care Team   Patient Care Team: [name], DO as PCP - General (Family Medicine) Cardiovascular, Gerald as Consulting Physician Justice Britain, MD as Consulting Physician (Orthopedic Surgery)  Subjective:   HPI: Patient wanted to review labs. Has low back pain which she has experienced before during a UTI.   Results for orders placed or performed in visit on 08/01/18  Lipid panel  Result Value Ref Range   Cholesterol 145 0 - 200 mg/dL   Triglycerides 82.0 0.0 - 149.0 mg/dL   HDL 54.70 >39.00 mg/dL   VLDL 16.4 0.0 - 40.0 mg/dL   LDL Cholesterol 74 0 - 99 mg/dL   Total CHOL/HDL Ratio 3    NonHDL 90.13   Urinalysis, Routine w reflex microscopic  Result Value Ref Range   Color, Urine YELLOW Yellow;Lt. Yellow;Straw;Dark Yellow;Amber;Green;Red;Brown   APPearance CLEAR Clear;Turbid;Slightly Cloudy;Cloudy   Specific Gravity, Urine <=1.005 (A) 1.000 - 1.030   pH 7.0 5.0 - 8.0   Total Protein, Urine NEGATIVE Negative   Urine Glucose NEGATIVE Negative   Ketones, ur NEGATIVE Negative   Bilirubin Urine NEGATIVE Negative   Hgb urine dipstick NEGATIVE Negative   Urobilinogen, UA 0.2 0.0 - 1.0   Leukocytes,Ua NEGATIVE  Negative   Nitrite NEGATIVE Negative   WBC, UA 0-2/hpf 0-2/hpf   RBC / HPF none seen 0-2/hpf  Comprehensive metabolic panel  Result Value Ref Range   Sodium 143 135 - 145 mEq/L   Potassium 4.1 3.5 - 5.1 mEq/L   Chloride 104 96 - 112 mEq/L   CO2 29 19 - 32 mEq/L   Glucose, Bld 100 (H) 70 - 99 mg/dL   BUN 17 6 - 23 mg/dL   Creatinine, Ser 0.88 0.40 - 1.20 mg/dL   Total Bilirubin 0.5 0.2 - 1.2 mg/dL   Alkaline Phosphatase 55 39 - 117 U/L   AST 22 0 - 37 U/L   ALT 20 0 - 35 U/L   Total Protein 6.7 6.0 - 8.3 g/dL   Albumin 4.4 3.5 - 5.2 g/dL   Calcium 9.3 8.4 - 10.5 mg/dL   GFR 64.04 >60.00 mL/min  Basic metabolic panel  Result Value Ref Range   Sodium 141 135 - 145 mEq/L   Potassium 4.4 3.5 - 5.1 mEq/L   Chloride 103 96 - 112 mEq/L   CO2 28 19 - 32 mEq/L   Glucose, Bld 98 70 - 99 mg/dL   BUN 17 6 - 23 mg/dL   Creatinine, Ser 0.91 0.40 - 1.20 mg/dL   Calcium 9.3 8.4 - 10.5 mg/dL   GFR 61.61 >60.00 mL/min   Review of Systems  Constitutional: Positive for malaise/fatigue. Negative for chills and fever.  Gastrointestinal: Negative for nausea and vomiting.  Genitourinary: Negative for dysuria, flank pain, frequency and urgency.  Musculoskeletal: Positive for back pain.  Neurological: Negative for dizziness.    Patient Active Problem List   Diagnosis Date Noted  . Calcific bursitis of shoulder 07/17/2018  . Paresthesias 06/17/2018  . HTN (hypertension) 06/17/2018  . Moderate persistent asthma with acute exacerbation 06/17/2018  . Dysthymia 06/17/2018  . Seasonal allergies 06/17/2018  . Former smoker 06/17/2018  . Pernicious anemia 06/17/2018  . Paroxysmal atrial fibrillation ([place]) 05/11/2018  . Palpitations 05/11/2018  . Hyperlipidemia 05/11/2018  . Systolic murmur 22/04/5425  . Hypothyroid 05/11/2018  . Obesity (BMI 30-39.9) 05/11/2018  . S/p reverse total shoulder arthroplasty 11/09/2017    Social History   Tobacco Use  . Smoking status: Former Smoker     Packs/day: 1.00    Years: 10.00    Pack years: 10.00    Types: Cigarettes    Last attempt to quit: 07/05/1988    Years since quitting: 30.1  . Smokeless tobacco: Never Used  Substance Use Topics  . Alcohol use: Never    Frequency: Never    Current Outpatient Medications:  .  albuterol (PROVENTIL HFA;VENTOLIN HFA) 108 (90 Base) MCG/ACT inhaler, Inhale 1-2 puffs into the lungs every 6 (six) hours as needed for wheezing or shortness of breath., Disp: 1 Inhaler, Rfl: 0 .  amLODipine (NORVASC) 10 MG tablet, Take 1 tablet (10 mg total) by mouth daily., Disp: 180 tablet, Rfl: 3 .  atorvastatin (LIPITOR) 10 MG tablet, TAKE 1 TABLET BY MOUTH EVERY DAY, Disp: 90 tablet, Rfl: 1 .  citalopram (CELEXA) 40 MG tablet, Take 1 tablet (40 mg total) by mouth at bedtime., Disp: 90 tablet, Rfl: 1 .  cyanocobalamin (,VITAMIN B-12,) 1000 MCG/ML injection, 1000 mcg (1 mg) injection once per month., Disp: 6 mL, Rfl: 0 .  ELIQUIS 5 MG TABS tablet, Take 5 mg by mouth daily. , Disp: , Rfl:  .  fluticasone (FLONASE) 50 MCG/ACT nasal spray, Place 2 sprays into both nostrils daily., Disp: 16 g, Rfl: 6 .  Fluticasone-Salmeterol (ADVAIR) 250-50 MCG/DOSE AEPB, Inhale 1 puff into the lungs 2 (two) times daily as needed (shortness of breath)., Disp: , Rfl:  .  gabapentin (NEURONTIN) 600 MG tablet, Take 600 mg by mouth at bedtime., Disp: , Rfl:  .  isosorbide mononitrate (IMDUR) 30 MG 24 hr tablet, TAKE 1 TABLET(30 MG) BY MOUTH DAILY, Disp: 30 tablet, Rfl: 1 .  levothyroxine (SYNTHROID, LEVOTHROID) 75 MCG tablet, Take 1 tablet (75 mcg total) by mouth daily before breakfast., Disp: 90 tablet, Rfl: 1 .  MELATONIN PO, Take 1 tablet by mouth at bedtime as needed (sleep)., Disp: , Rfl:  .  Menthol, Topical Analgesic, (BIOFREEZE EX), Apply 1 patch topically daily as needed (pain)., Disp: , Rfl:  .  metoprolol succinate (TOPROL-XL) 50 MG 24 hr tablet, TAKE 1 TABLET BY MOUTH  DAILY IN THE EVENING, Disp: 90 tablet, Rfl: 0 .   montelukast (SINGULAIR) 10 MG tablet, Take 1 tablet (10 mg total) by mouth at bedtime., Disp: 30 tablet, Rfl: 1 .  Multiple Vitamin (MULTIVITAMIN WITH MINERALS) TABS tablet, Take 1 tablet by mouth daily., Disp: , Rfl:  .  TURMERIC PO, Take 1 capsule by mouth daily., Disp: , Rfl:  .  Vitamin D, Ergocalciferol, (DRISDOL) 1.25 MG (50000 UT) CAPS capsule, Take 1 capsule (50,000 Units total) by mouth every 7 (seven) days., Disp: 12 capsule, Rfl: 0 .  nitroGLYCERIN (NITROSTAT) 0.4 MG SL tablet, Place 1 tablet (0.4 mg total) under the tongue every 5 (five) minutes as needed for up to 30 days for chest pain.,  Disp: 25 tablet, Rfl: 2  Allergies  Allergen Reactions  . Sulfa Antibiotics Other (See Comments)    STEVENS JOHNSON  . Sulfamethoxazole Other (See Comments)    STEVENS JOHNSON  . Quinolones Swelling and Other (See Comments)    SWELLING REACTION UNSPECIFIED    . Codeine Nausea And Vomiting    Objective:   VITALS: Per patient if applicable, see vitals. GENERAL: Alert and in no acute distress. CARDIOPULMONARY: No increased WOB. Speaking in clear sentences.  PSYCH: Pleasant and cooperative. Speech normal rate and rhythm. Affect is appropriate. Insight and judgement are appropriate. Attention is focused, linear, and appropriate.  NEURO: Oriented as arrived to appointment on time with no prompting.   Depression screen [facility] 2/9 06/13/2018  Decreased Interest 0  Down, Depressed, Hopeless 0  PHQ - 2 Score 0  Altered sleeping 2  Tired, decreased energy 3  Change in appetite 2  Feeling bad or failure about yourself  0  Trouble concentrating 1  Moving slowly or fidgety/restless 0  Suicidal thoughts 0  PHQ-9 Score 8  Difficult doing work/chores Not difficult at all    Assessment and Plan:   Micki was seen today for follow-up.  Diagnoses and all orders for this visit:  Acute bilateral low back pain without sciatica Comments: Ex-athlete. Reviewed exercises.  E. coli UTI Comments: Did  not treat until UCx came back with 10-50K e coli. See orders and lab review message.  Orders: -     cephALEXin (KEFLEX) 500 MG capsule; Take 1 capsule (500 mg total) by mouth 3 (three) times daily.    Marland Kitchen COVID-19 Education: The signs and symptoms of COVID-19 were discussed with the patient and how to seek care for testing if needed. The importance of social distancing was discussed today. . Reviewed expectations re: course of current medical issues. . Discussed self-management of symptoms. . Outlined signs and symptoms indicating need for more acute intervention. . Patient verbalized understanding and all questions were answered. Marland Kitchen Health Maintenance issues including appropriate healthy diet, exercise, and smoking avoidance were discussed with patient. . See orders for this visit as documented in the electronic medical record.  [name], DO  Records requested if needed. Time spent: 25 minutes, of which >50% was spent in obtaining information about her symptoms, reviewing her previous labs, evaluations, and treatments, counseling her about her condition (please see the discussed topics above), and developing a plan to further investigate it; she had a number of questions which I addressed.

## 2018-08-03 ENCOUNTER — Other Ambulatory Visit: Payer: Self-pay

## 2018-08-03 ENCOUNTER — Encounter: Payer: Self-pay | Admitting: Family Medicine

## 2018-08-03 ENCOUNTER — Ambulatory Visit (INDEPENDENT_AMBULATORY_CARE_PROVIDER_SITE_OTHER): Payer: Medicare Other | Admitting: Family Medicine

## 2018-08-03 VITALS — Ht 61.0 in | Wt 197.0 lb

## 2018-08-03 DIAGNOSIS — M545 Low back pain, unspecified: Secondary | ICD-10-CM

## 2018-08-03 DIAGNOSIS — N390 Urinary tract infection, site not specified: Secondary | ICD-10-CM | POA: Diagnosis not present

## 2018-08-03 DIAGNOSIS — B9620 Unspecified Escherichia coli [E. coli] as the cause of diseases classified elsewhere: Secondary | ICD-10-CM

## 2018-08-03 LAB — URINE CULTURE
MICRO NUMBER:: 491923
SPECIMEN QUALITY:: ADEQUATE

## 2018-08-03 NOTE — Patient Instructions (Signed)
Sacroiliac Joint Dysfunction  Sacroiliac joint dysfunction is a condition that causes inflammation on one or both sides of the sacroiliac (SI) joint. The SI joint connects the lower part of the spine (sacrum) with the two upper portions of the pelvis (ilium). This condition causes deep aching or burning pain in the low back. In some cases, the pain may also spread into one or both buttocks, hips, or thighs. What are the causes? This condition may be caused by:  Pregnancy. During pregnancy, extra stress is put on the SI joints because the pelvis widens.  Injury, such as: ? Injuries from car accidents. ? Sports-related injuries. ? Work-related injuries.  Having one leg that is shorter than the other.  Conditions that affect the joints, such as: ? Rheumatoid arthritis. ? Gout. ? Psoriatic arthritis. ? Joint infection (septic arthritis). Sometimes, the cause of SI joint dysfunction is not known. What are the signs or symptoms? Symptoms of this condition include:  Aching or burning pain in the lower back. The pain may also spread to other areas, such as: ? Buttocks. ? Groin. ? Thighs.  Muscle spasms in or around the painful areas.  Increased pain when standing, walking, running, stair climbing, bending, or lifting. How is this diagnosed? This condition is diagnosed with a physical exam and medical history. During the exam, the health care provider may move one or both of your legs to different positions to check for pain. Various tests may be done to confirm the diagnosis, including:  Imaging tests to look for other causes of pain. These may include: ? MRI. ? CT scan. ? Bone scan.  Diagnostic injection. A numbing medicine is injected into the SI joint using a needle. If your pain is temporarily improved or stopped after the injection, this can indicate that SI joint dysfunction is the problem. How is this treated? Treatment depends on the cause and severity of your condition.  Treatment options may include:  Ice or heat applied to the lower back area after an injury. This may help reduce pain and muscle spasms.  Medicines to relieve pain or inflammation or to relax the muscles.  Wearing a back brace (sacroiliac brace) to help support the joint while your back is healing.  Physical therapy to increase muscle strength around the joint and flexibility at the joint. This may also involve learning proper body positions and ways of moving to relieve stress on the joint.  Direct manipulation of the SI joint.  Injections of steroid medicine into the joint to reduce pain and swelling.  Radiofrequency ablation to burn away nerves that are carrying pain messages from the joint.  Use of a device that provides electrical stimulation to help reduce pain at the joint.  Surgery to put in screws and plates that limit or prevent joint motion. This is rare. Follow these instructions at home: Medicines  Take over-the-counter and prescription medicines only as told by your health care provider.  Do not drive or use heavy machinery while taking prescription pain medicine.  If you are taking prescription pain medicine, take actions to prevent or treat constipation. Your health care provider may recommend that you: ? Drink enough fluid to keep your urine pale yellow. ? Eat foods that are high in fiber, such as fresh fruits and vegetables, whole grains, and beans. ? Limit foods that are high in fat and processed sugars, such as fried or sweet foods. ? Take an over-the-counter or prescription medicine for constipation. If you have a brace:    Wear the brace as told by your health care provider. Remove it only as told by your health care provider.  Keep the brace clean.  If the brace is not waterproof: ? Do not let it get wet. ? Cover it with a watertight covering when you take a bath or a shower. Managing pain, stiffness, and swelling      Icing can help with pain and  swelling. Heat may help with muscle tension or spasms. Ask your health care provider if you should use ice or heat.  If directed, put ice on the affected area: ? If you have a removable brace, remove it as told by your health care provider. ? Put ice in a plastic bag. ? Place a towel between your skin and the bag. ? Leave the ice on for 20 minutes, 2-3 times a day.  If directed, apply heat to the affected area. Use the heat source that your health care provider recommends, such as a moist heat pack or a heating pad. ? Place a towel between your skin and the heat source. ? Leave the heat on for 20-30 minutes. ? Remove the heat if your skin turns bright red. This is especially important if you are unable to feel pain, heat, or cold. You may have a greater risk of getting burned. General instructions  Rest as needed. Ask your health care provider what activities are safe for you.  Return to your normal activities as told by your health care provider.  Exercise as directed by your health care provider or physical therapist.  Do not use any products that contain nicotine or tobacco, such as cigarettes and e-cigarettes. These can delay bone healing. If you need help quitting, ask your health care provider.  Keep all follow-up visits as told by your health care provider. This is important. Contact a health care provider if:  Your pain is not controlled with medicine.  You have a fever.  Your pain is getting worse. Get help right away if:  You have weakness, numbness, or tingling in your legs or feet.  You lose control of your bladder or bowel. Summary  Sacroiliac joint dysfunction is a condition that causes inflammation on one or both sides of the sacroiliac (SI) joint.  This condition causes deep aching or burning pain in the low back. In some cases, the pain may also spread into one or both buttocks, hips, or thighs.  Treatment depends on the cause and severity of your condition.  It may include medicines to reduce pain and swelling or to relax muscles. This information is not intended to replace advice given to you by your health care provider. Make sure you discuss any questions you have with your health care provider. Document Released: 05/27/2008 Document Revised: 04/10/2017 Document Reviewed: 04/10/2017 Elsevier Interactive Patient Education  2019 [facility] Ask your health care provider which exercises are safe for you. Do exercises exactly as told by your health care provider and adjust them as directed. It is normal to feel mild stretching, pulling, tightness, or discomfort as you do these exercises, but you should stop right away if you feel sudden pain or your pain gets worse. Do not begin these exercises until told by your health care provider. Stretching and range of motion exercises These exercises warm up your muscles and joints and improve the movement and flexibility of your back. These exercises also help to relieve pain, numbness, and tingling. Exercise A: Single knee to  chest  1. Lie on your back on a firm surface with both legs straight. 2. Bend one of your knees. Use your hands to move your knee up toward your chest until you feel a gentle stretch in your lower back and buttock. ? Hold your leg in this position by holding onto the front of your knee. ? Keep your other leg as straight as possible. 3. Hold for __________ seconds. 4. Slowly return to the starting position. 5. Repeat with your other leg. Repeat __________ times. Complete this exercise __________ times a day. Exercise B: Prone extension on elbows  1. Lie on your abdomen on a firm surface. 2. Prop yourself up on your elbows. 3. Use your arms to help lift your chest up until you feel a gentle stretch in your abdomen and your lower back. ? This will place some of your body weight on your elbows. If this is uncomfortable, try stacking pillows under your  chest. ? Your hips should stay down, against the surface that you are lying on. Keep your hip and back muscles relaxed. 4. Hold for __________ seconds. 5. Slowly relax your upper body and return to the starting position. Repeat __________ times. Complete this exercise __________ times a day. Strengthening exercises These exercises build strength and endurance in your back. Endurance is the ability to use your muscles for a long time, even after they get tired. Exercise C: Pelvic tilt 1. Lie on your back on a firm surface. Bend your knees and keep your feet flat. 2. Tense your abdominal muscles. Tip your pelvis up toward the ceiling and flatten your lower back into the floor. ? To help with this exercise, you may place a small towel under your lower back and try to push your back into the towel. 3. Hold for __________ seconds. 4. Let your muscles relax completely before you repeat this exercise. Repeat __________ times. Complete this exercise __________ times a day. Exercise D: Alternating arm and leg raises  1. Get on your hands and knees on a firm surface. If you are on a hard floor, you may want to use padding to cushion your knees, such as an exercise mat. 2. Line up your arms and legs. Your hands should be below your shoulders, and your knees should be below your hips. 3. Lift your left leg behind you. At the same time, raise your right arm and straighten it in front of you. ? Do not lift your leg higher than your hip. ? Do not lift your arm higher than your shoulder. ? Keep your abdominal and back muscles tight. ? Keep your hips facing the ground. ? Do not arch your back. ? Keep your balance carefully, and do not hold your breath. 4. Hold for __________ seconds. 5. Slowly return to the starting position and repeat with your right leg and your left arm. Repeat __________ times. Complete this exercise __________times a day. Exercise J: Single leg lower with bent knees 1. Lie on your  back on a firm surface. 2. Tense your abdominal muscles and lift your feet off the floor, one foot at a time, so your knees and hips are bent in an "L" shape (at about 90 degrees). ? Your knees should be over your hips and your lower legs should be parallel to the floor. 3. Keeping your abdominal muscles tense and your knee bent, slowly lower one of your legs so your toe touches the ground. 4. Lift your leg back up to return to  the starting position. ? Do not hold your breath. ? Do not let your back arch. Keep your back flat against the ground. 5. Repeat with your other leg. Repeat __________ times. Complete this exercise __________ times a day. Posture and body mechanics  Body mechanics refers to the movements and positions of your body while you do your daily activities. Posture is part of body mechanics. Good posture and healthy body mechanics can help to relieve stress in your body's tissues and joints. Good posture means that your spine is in its natural S-curve position (your spine is neutral), your shoulders are pulled back slightly, and your head is not tipped forward. The following are general guidelines for applying improved posture and body mechanics to your everyday activities. Standing   When standing, keep your spine neutral and your feet about hip-width apart. Keep a slight bend in your knees. Your ears, shoulders, and hips should line up.  When you do a task in which you stand in one place for a long time, place one foot up on a stable object that is 2-4 inches (5-10 cm) high, such as a footstool. This helps keep your spine neutral. Sitting   When sitting, keep your spine neutral and keep your feet flat on the floor. Use a footrest, if necessary, and keep your thighs parallel to the floor. Avoid rounding your shoulders, and avoid tilting your head forward.  When working at a desk or a computer, keep your desk at a height where your hands are slightly lower than your elbows.  Slide your chair under your desk so you are close enough to maintain good posture.  When working at a computer, place your monitor at a height where you are looking straight ahead and you do not have to tilt your head forward or downward to look at the screen. Resting   When lying down and resting, avoid positions that are most painful for you.  If you have pain with activities such as sitting, bending, stooping, or squatting (flexion-based activities), lie in a position in which your body does not bend very much. For example, avoid curling up on your side with your arms and knees near your chest (fetal position).  If you have pain with activities such as standing for a long time or reaching with your arms (extension-based activities), lie with your spine in a neutral position and bend your knees slightly. Try the following positions: ? Lying on your side with a pillow between your knees. ? Lying on your back with a pillow under your knees. Lifting   When lifting objects, keep your feet at least shoulder-width apart and tighten your abdominal muscles.  Bend your knees and hips and keep your spine neutral. It is important to lift using the strength of your legs, not your back. Do not lock your knees straight out.  Always ask for help to lift heavy or awkward objects. This information is not intended to replace advice given to you by your health care provider. Make sure you discuss any questions you have with your health care provider. Document Released: 02/28/2005 Document Revised: 11/05/2015 Document Reviewed: 12/10/2014 Elsevier Interactive Patient Education  2019 [employer].

## 2018-08-04 ENCOUNTER — Encounter: Payer: Self-pay | Admitting: Family Medicine

## 2018-08-04 MED ORDER — CEPHALEXIN 500 MG PO CAPS: 500.0000 mg | ORAL_CAPSULE | Freq: Three times a day (TID) | ORAL | 0 refills | Status: DC

## 2018-08-05 ENCOUNTER — Other Ambulatory Visit: Payer: Self-pay | Admitting: Cardiology

## 2018-08-06 ENCOUNTER — Other Ambulatory Visit: Payer: Self-pay | Admitting: Cardiology

## 2018-08-07 NOTE — Telephone Encounter (Signed)
Patient is no longer taking 10 mg due to leg swelling

## 2018-08-13 DIAGNOSIS — I2510 Atherosclerotic heart disease of native coronary artery without angina pectoris: Secondary | ICD-10-CM | POA: Diagnosis present

## 2018-08-13 NOTE — Telephone Encounter (Signed)
Please respond

## 2018-08-17 ENCOUNTER — Other Ambulatory Visit (HOSPITAL_COMMUNITY)
Admission: RE | Admit: 2018-08-17 | Discharge: 2018-08-17 | Disposition: A | Discharge status: Home / Self Care | Payer: Medicare Other | Source: Ambulatory Visit | Attending: Cardiology | Admitting: Cardiology

## 2018-08-17 ENCOUNTER — Other Ambulatory Visit: Payer: Self-pay

## 2018-08-17 ENCOUNTER — Ambulatory Visit: Payer: Medicare Other | Admitting: Family Medicine

## 2018-08-17 DIAGNOSIS — Z01812 Encounter for preprocedural laboratory examination: Secondary | ICD-10-CM | POA: Diagnosis present

## 2018-08-17 DIAGNOSIS — Z1159 Encounter for screening for other viral diseases: Secondary | ICD-10-CM | POA: Diagnosis not present

## 2018-08-18 LAB — NOVEL CORONAVIRUS, NAA (HOSP ORDER, SEND-OUT TO REF LAB; TAT 18-24 HRS): SARS-CoV-2, NAA: NOT DETECTED

## 2018-08-21 ENCOUNTER — Other Ambulatory Visit: Payer: Self-pay

## 2018-08-21 ENCOUNTER — Ambulatory Visit (HOSPITAL_COMMUNITY)
Admission: RE | Admit: 2018-08-21 | Discharge: 2018-08-21 | Disposition: A | Discharge status: Home / Self Care | Payer: Medicare Other | Attending: Cardiology | Admitting: Cardiology

## 2018-08-21 ENCOUNTER — Encounter (HOSPITAL_COMMUNITY)
Admission: RE | Disposition: A | Discharge status: Home / Self Care | Payer: Self-pay | Source: Home / Self Care | Attending: Cardiology

## 2018-08-21 DIAGNOSIS — Z87891 Personal history of nicotine dependence: Secondary | ICD-10-CM | POA: Insufficient documentation

## 2018-08-21 DIAGNOSIS — E039 Hypothyroidism, unspecified: Secondary | ICD-10-CM | POA: Insufficient documentation

## 2018-08-21 DIAGNOSIS — Z8261 Family history of arthritis: Secondary | ICD-10-CM | POA: Insufficient documentation

## 2018-08-21 DIAGNOSIS — Z7902 Long term (current) use of antithrombotics/antiplatelets: Secondary | ICD-10-CM | POA: Diagnosis not present

## 2018-08-21 DIAGNOSIS — Z8249 Family history of ischemic heart disease and other diseases of the circulatory system: Secondary | ICD-10-CM | POA: Diagnosis not present

## 2018-08-21 DIAGNOSIS — I2584 Coronary atherosclerosis due to calcified coronary lesion: Secondary | ICD-10-CM | POA: Insufficient documentation

## 2018-08-21 DIAGNOSIS — F329 Major depressive disorder, single episode, unspecified: Secondary | ICD-10-CM | POA: Insufficient documentation

## 2018-08-21 DIAGNOSIS — E785 Hyperlipidemia, unspecified: Secondary | ICD-10-CM | POA: Insufficient documentation

## 2018-08-21 DIAGNOSIS — M1990 Unspecified osteoarthritis, unspecified site: Secondary | ICD-10-CM | POA: Insufficient documentation

## 2018-08-21 DIAGNOSIS — Z79899 Other long term (current) drug therapy: Secondary | ICD-10-CM | POA: Diagnosis not present

## 2018-08-21 DIAGNOSIS — Z7951 Long term (current) use of inhaled steroids: Secondary | ICD-10-CM | POA: Insufficient documentation

## 2018-08-21 DIAGNOSIS — I2510 Atherosclerotic heart disease of native coronary artery without angina pectoris: Secondary | ICD-10-CM | POA: Diagnosis present

## 2018-08-21 DIAGNOSIS — I25118 Atherosclerotic heart disease of native coronary artery with other forms of angina pectoris: Secondary | ICD-10-CM | POA: Diagnosis not present

## 2018-08-21 DIAGNOSIS — J45909 Unspecified asthma, uncomplicated: Secondary | ICD-10-CM | POA: Diagnosis not present

## 2018-08-21 DIAGNOSIS — I480 Paroxysmal atrial fibrillation: Secondary | ICD-10-CM | POA: Diagnosis not present

## 2018-08-21 DIAGNOSIS — I208 Other forms of angina pectoris: Secondary | ICD-10-CM

## 2018-08-21 DIAGNOSIS — D510 Vitamin B12 deficiency anemia due to intrinsic factor deficiency: Secondary | ICD-10-CM | POA: Insufficient documentation

## 2018-08-21 DIAGNOSIS — Z7901 Long term (current) use of anticoagulants: Secondary | ICD-10-CM | POA: Diagnosis not present

## 2018-08-21 DIAGNOSIS — Z79890 Hormone replacement therapy (postmenopausal): Secondary | ICD-10-CM | POA: Diagnosis not present

## 2018-08-21 DIAGNOSIS — I10 Essential (primary) hypertension: Secondary | ICD-10-CM | POA: Diagnosis not present

## 2018-08-21 DIAGNOSIS — Z823 Family history of stroke: Secondary | ICD-10-CM | POA: Diagnosis not present

## 2018-08-21 DIAGNOSIS — Z825 Family history of asthma and other chronic lower respiratory diseases: Secondary | ICD-10-CM | POA: Insufficient documentation

## 2018-08-21 DIAGNOSIS — Z955 Presence of coronary angioplasty implant and graft: Secondary | ICD-10-CM

## 2018-08-21 HISTORY — PX: LEFT HEART CATH AND CORONARY ANGIOGRAPHY: CATH118249

## 2018-08-21 HISTORY — PX: CORONARY STENT INTERVENTION: CATH118234

## 2018-08-21 HISTORY — PX: CORONARY ANGIOPLASTY: SHX604

## 2018-08-21 LAB — BASIC METABOLIC PANEL
Anion gap: 9 (ref 5–15)
BUN: 20 mg/dL (ref 8–23)
CO2: 24 mmol/L (ref 22–32)
Calcium: 9.1 mg/dL (ref 8.9–10.3)
Chloride: 110 mmol/L (ref 98–111)
Creatinine, Ser: 1.04 mg/dL — ABNORMAL HIGH (ref 0.44–1.00)
GFR calc Af Amer: >60 mL/min (ref >60)
GFR calc non Af Amer: 56 mL/min — ABNORMAL LOW (ref >60)
Glucose, Bld: 105 mg/dL — ABNORMAL HIGH (ref 70–99)
Potassium: 5 mmol/L (ref 3.5–5.1)
Sodium: 143 mmol/L (ref 135–145)

## 2018-08-21 LAB — CBC
HCT: 39.5 % (ref 36.0–46.0)
Hemoglobin: 13.1 g/dL (ref 12.0–15.0)
MCH: 31.3 pg (ref 26.0–34.0)
MCHC: 33.2 g/dL (ref 30.0–36.0)
MCV: 94.3 fL (ref 80.0–100.0)
Platelets: 247 10*3/uL (ref 150–400)
RBC: 4.19 MIL/uL (ref 3.87–5.11)
RDW: 12.6 % (ref 11.5–15.5)
WBC: 4.6 10*3/uL (ref 4.0–10.5)
nRBC: 0 % (ref 0.0–0.2)

## 2018-08-21 LAB — POCT ACTIVATED CLOTTING TIME: Activated Clotting Time: 356 seconds

## 2018-08-21 SURGERY — LEFT HEART CATH AND CORONARY ANGIOGRAPHY
Anesthesia: LOCAL

## 2018-08-21 MED ORDER — CLOPIDOGREL BISULFATE 300 MG PO TABS
ORAL_TABLET | ORAL | Status: DC | PRN
  Administered 2018-08-21: 600 mg via ORAL

## 2018-08-21 MED ORDER — SODIUM CHLORIDE 0.9% FLUSH: 3.0000 mL | Freq: Two times a day (BID) | INTRAVENOUS | Status: DC

## 2018-08-21 MED ORDER — LIDOCAINE HCL (PF) 1 % IJ SOLN
INTRAMUSCULAR | Status: DC | PRN
  Administered 2018-08-21: 2 mL

## 2018-08-21 MED ORDER — SODIUM CHLORIDE 0.9 % IV SOLN: 250.0000 mL | INTRAVENOUS | Status: DC | PRN

## 2018-08-21 MED ORDER — CLOPIDOGREL BISULFATE 75 MG PO TABS: 75.0000 mg | ORAL_TABLET | Freq: Every day | ORAL | 2 refills | Status: DC

## 2018-08-21 MED ORDER — NITROGLYCERIN 1 MG/10 ML FOR IR/CATH LAB
INTRA_ARTERIAL | Status: DC | PRN
  Administered 2018-08-21: 200 ug via INTRACORONARY

## 2018-08-21 MED ORDER — MIDAZOLAM HCL 2 MG/2ML IJ SOLN
INTRAMUSCULAR | Status: AC
  Filled 2018-08-21: qty 2

## 2018-08-21 MED ORDER — MIDAZOLAM HCL 2 MG/2ML IJ SOLN
INTRAMUSCULAR | Status: DC | PRN
  Administered 2018-08-21: 1 mg via INTRAVENOUS

## 2018-08-21 MED ORDER — HYDRALAZINE HCL 20 MG/ML IJ SOLN: 10.0000 mg | INTRAMUSCULAR | Status: DC | PRN

## 2018-08-21 MED ORDER — FENTANYL CITRATE (PF) 100 MCG/2ML IJ SOLN
INTRAMUSCULAR | Status: AC
  Filled 2018-08-21: qty 2

## 2018-08-21 MED ORDER — HEPARIN SODIUM (PORCINE) 1000 UNIT/ML IJ SOLN
INTRAMUSCULAR | Status: AC
  Filled 2018-08-21: qty 1

## 2018-08-21 MED ORDER — ASPIRIN 81 MG PO CHEW
81.0000 mg | CHEWABLE_TABLET | ORAL | Status: AC
  Administered 2018-08-21: 81 mg via ORAL
  Filled 2018-08-21: qty 1

## 2018-08-21 MED ORDER — NITROGLYCERIN 1 MG/10 ML FOR IR/CATH LAB
INTRA_ARTERIAL | Status: AC
  Filled 2018-08-21: qty 10

## 2018-08-21 MED ORDER — CLOPIDOGREL BISULFATE 300 MG PO TABS
ORAL_TABLET | ORAL | Status: AC
  Filled 2018-08-21: qty 2

## 2018-08-21 MED ORDER — SODIUM CHLORIDE 0.9% FLUSH: 3.0000 mL | INTRAVENOUS | Status: DC | PRN

## 2018-08-21 MED ORDER — LIDOCAINE HCL (PF) 1 % IJ SOLN
INTRAMUSCULAR | Status: AC
  Filled 2018-08-21: qty 30

## 2018-08-21 MED ORDER — HEPARIN (PORCINE) IN NACL 1000-0.9 UT/500ML-% IV SOLN
INTRAVENOUS | Status: DC | PRN
  Administered 2018-08-21 (×2): 500 mL

## 2018-08-21 MED ORDER — CLOPIDOGREL BISULFATE 75 MG PO TABS: 75.0000 mg | ORAL_TABLET | Freq: Every day | ORAL | Status: DC

## 2018-08-21 MED ORDER — HEPARIN (PORCINE) IN NACL 1000-0.9 UT/500ML-% IV SOLN
INTRAVENOUS | Status: AC
  Filled 2018-08-21: qty 1000

## 2018-08-21 MED ORDER — IOHEXOL 350 MG/ML SOLN
INTRAVENOUS | Status: DC | PRN
  Administered 2018-08-21: 120 mL via INTRA_ARTERIAL

## 2018-08-21 MED ORDER — SODIUM CHLORIDE 0.9 % WEIGHT BASED INFUSION: 1.0000 mL/kg/h | INTRAVENOUS | Status: DC

## 2018-08-21 MED ORDER — VERAPAMIL HCL 2.5 MG/ML IV SOLN
INTRAVENOUS | Status: DC | PRN
  Administered 2018-08-21: 09:00:00 10 mL via INTRA_ARTERIAL

## 2018-08-21 MED ORDER — ONDANSETRON HCL 4 MG/2ML IJ SOLN: 4.0000 mg | Freq: Four times a day (QID) | INTRAMUSCULAR | Status: DC | PRN

## 2018-08-21 MED ORDER — SODIUM CHLORIDE 0.9 % IV SOLN: INTRAVENOUS | Status: AC

## 2018-08-21 MED ORDER — ACETAMINOPHEN 325 MG PO TABS: 650.0000 mg | ORAL_TABLET | ORAL | Status: DC | PRN

## 2018-08-21 MED ORDER — FENTANYL CITRATE (PF) 100 MCG/2ML IJ SOLN
INTRAMUSCULAR | Status: DC | PRN
  Administered 2018-08-21: 50 ug via INTRAVENOUS

## 2018-08-21 MED ORDER — SODIUM CHLORIDE 0.9 % WEIGHT BASED INFUSION
3.0000 mL/kg/h | INTRAVENOUS | Status: AC
  Administered 2018-08-21: 3 mL/kg/h via INTRAVENOUS

## 2018-08-21 MED ORDER — LABETALOL HCL 5 MG/ML IV SOLN: 10.0000 mg | INTRAVENOUS | Status: DC | PRN

## 2018-08-21 MED ORDER — VERAPAMIL HCL 2.5 MG/ML IV SOLN
INTRAVENOUS | Status: AC
  Filled 2018-08-21: qty 2

## 2018-08-21 MED ORDER — HEPARIN SODIUM (PORCINE) 1000 UNIT/ML IJ SOLN
INTRAMUSCULAR | Status: DC | PRN
  Administered 2018-08-21 (×2): 4500 [IU] via INTRAVENOUS

## 2018-08-21 MED FILL — CLOPIDOGREL 75 MG TABLET: 75 | 90 days supply | Qty: 90 | Fill #0

## 2018-08-21 SURGICAL SUPPLY — 18 items
BALLN SAPPHIRE 2.0X12 (BALLOONS) ×2
BALLN WOLVERINE 2.00X6 (BALLOONS) ×2
BALLOON SAPPHIRE 2.0X12 (BALLOONS) ×1 IMPLANT
BALLOON WOLVERINE 2.00X6 (BALLOONS) ×1 IMPLANT
CATH LAUNCHER 6FR EBU 3 (CATHETERS) ×2 IMPLANT
CATH OPTITORQUE TIG 4.0 5F (CATHETERS) ×2 IMPLANT
DEVICE RAD COMP TR BAND LRG (VASCULAR PRODUCTS) ×2 IMPLANT
GLIDESHEATH SLEND A-KIT 6F 22G (SHEATH) ×2 IMPLANT
GUIDEWIRE INQWIRE 1.5J.035X260 (WIRE) ×1 IMPLANT
INQWIRE 1.5J .035X260CM (WIRE) ×2
KIT ENCORE 26 ADVANTAGE (KITS) ×2 IMPLANT
KIT HEART LEFT (KITS) ×2 IMPLANT
KIT HEMO VALVE WATCHDOG (MISCELLANEOUS) ×2 IMPLANT
PACK CARDIAC CATHETERIZATION (CUSTOM PROCEDURE TRAY) ×2 IMPLANT
STENT RESOLUTE ONYX 2.0X15 (Permanent Stent) ×2 IMPLANT
TRANSDUCER W/STOPCOCK (MISCELLANEOUS) ×2 IMPLANT
TUBING CIL FLEX 10 FLL-RA (TUBING) ×2 IMPLANT
WIRE ASAHI PROWATER 180CM (WIRE) ×2 IMPLANT

## 2018-08-21 NOTE — Interval H&P Note (Signed)
History and Physical Interval Note:  08/21/2018 9:08 AM  Heather Christensen  has presented today for surgery, with the diagnosis of unstable angina.  The various methods of treatment have been discussed with the patient and family. After consideration of risks, benefits and other options for treatment, the patient has consented to  Procedure(s): LEFT HEART CATH AND CORONARY ANGIOGRAPHY (N/A) as a surgical intervention.  The patient's history has been reviewed, patient examined, no change in status, stable for surgery.  I have reviewed the patient's chart and labs.  Questions were answered to the patient's satisfaction.    2016/2017 Appropriate Use Criteria for Coronary Revascularization Clinical Presentation: Diabetes Mellitus? Symptom Status? S/P CABG? Antianginal Therapy (# of long-acting drugs)? Results of Non-invasive testing? FFR/iFR results in all diseased vessels? Patient undergoing renal transplant? Patient undergoing percutaneous valve procedure (TAVR, MitraClip, Others)? Symptom Status:  Ischemic Symptoms  Non-invasive Testing:  Intermediate Risk  If no or indeterminate stress test, FFR/iFR results in all diseased vessels:  N/A  Diabetes Mellitus:  No  S/P CABG:  No  Antianginal therapy (number of long-acting drugs):  >=2  Patient undergoing renal transplant:  No  Patient undergoing percutaneous valve procedure:  No    newline 1 Vessel Disease PCI CABG  No proximal LAD involvement, No proximal left dominant LCX involvement A (8); Indication 2 M (6); Indication 2   Proximal left dominant LCX involvement A (8); Indication 5 A (8); Indication 5   Proximal LAD involvement A (8); Indication 5 A (8); Indication 5   newline 2 Vessel Disease  No proximal LAD involvement A (8); Indication 8 A (7); Indication 8   Proximal LAD involvement A (8); Indication 11 A (8); Indication 11   newline 3 Vessel Disease  Low disease complexity (e.g., focal stenoses, SYNTAX <=22) A (8); Indication 17  A (8); Indication 17   Intermediate or high disease complexity (e.g., SYNTAX >=23) M (6); Indication 21 A (9); Indication 21   newline Left Main Disease  Isolated LMCA disease: ostial or midshaft A (7); Indication 24 A (9); Indication 24   Isolated LMCA disease: bifurcation involvement M (6); Indication 25 A (9); Indication 25   LMCA ostial or midshaft, concurrent low disease burden multivessel disease (e.g., 1-2 additional focal stenoses, SYNTAX <=22) A (7); Indication 26 A (9); Indication 26   LMCA ostial or midshaft, concurrent intermediate or high disease burden multivessel disease (e.g., 1-2 additional bifurcation stenoses, long stenoses, SYNTAX >=23) M (4); Indication 27 A (9); Indication 27   LMCA bifurcation involvement, concurrent low disease burden multivessel disease (e.g., 1-2 additional focal stenoses, SYNTAX <=22) M (6); Indication 28 A (9); Indication 28   LMCA bifurcation involvement, concurrent intermediate or high disease burden multivessel disease (e.g., 1-2 additional bifurcation stenoses, long stenoses, SYNTAX >=23) R (3); Indication 29 A (9); Indication [place]

## 2018-08-21 NOTE — Progress Notes (Signed)
D/c instructions given to boyfriend Richardson Landry, via telephone, due to [place] restrictions.  All questions answered and Richardson Landry verbalized understanding.

## 2018-08-21 NOTE — Discharge Instructions (Signed)
Radial Site Care ° °This sheet gives you information about how to care for yourself after your procedure. Your health care provider may also give you more specific instructions. If you have problems or questions, contact your health care provider. °What can I expect after the procedure? °After the procedure, it is common to have: °· Bruising and tenderness at the catheter insertion area. °Follow these instructions at home: °Medicines °· Take over-the-counter and prescription medicines only as told by your health care provider. °Insertion site care °· Follow instructions from your health care provider about how to take care of your insertion site. Make sure you: °? Wash your hands with soap and water before you change your bandage (dressing). If soap and water are not available, use hand sanitizer. °? Change your dressing as told by your health care provider. °? Leave stitches (sutures), skin glue, or adhesive strips in place. These skin closures may need to stay in place for 2 weeks or longer. If adhesive strip edges start to loosen and curl up, you may trim the loose edges. Do not remove adhesive strips completely unless your health care provider tells you to do that. °· Check your insertion site every day for signs of infection. Check for: °? Redness, swelling, or pain. °? Fluid or blood. °? Pus or a bad smell. °? Warmth. °· Do not take baths, swim, or use a hot tub until your health care provider approves. °· You may shower 24-48 hours after the procedure, or as directed by your health care provider. °? Remove the dressing and gently wash the site with plain soap and water. °? Pat the area dry with a clean towel. °? Do not rub the site. That could cause bleeding. °· Do not apply powder or lotion to the site. °Activity ° °· For 24 hours after the procedure, or as directed by your health care provider: °? Do not flex or bend the affected arm. °? Do not push or pull heavy objects with the affected arm. °? Do not  drive yourself home from the hospital or clinic. You may drive 24 hours after the procedure unless your health care provider tells you not to. °? Do not operate machinery or power tools. °· Do not lift anything that is heavier than 10 lb (4.5 kg), or the limit that you are told, until your health care provider says that it is safe. °· Ask your health care provider when it is okay to: °? Return to work or school. °? Resume usual physical activities or sports. °? Resume sexual activity. °General instructions °· If the catheter site starts to bleed, raise your arm and put firm pressure on the site. If the bleeding does not stop, get help right away. This is a medical emergency. °· If you went home on the same day as your procedure, a responsible adult should be with you for the first 24 hours after you arrive home. °· Keep all follow-up visits as told by your health care provider. This is important. °Contact a health care provider if: °· You have a fever. °· You have redness, swelling, or yellow drainage around your insertion site. °Get help right away if: °· You have unusual pain at the radial site. °· The catheter insertion area swells very fast. °· The insertion area is bleeding, and the bleeding does not stop when you hold steady pressure on the area. °· Your arm or hand becomes pale, cool, tingly, or numb. °These symptoms may represent a serious problem   that is an emergency. Do not wait to see if the symptoms will go away. Get medical help right away. Call your local emergency services (911 in the [place] Do not drive yourself to the hospital. °Summary °· After the procedure, it is common to have bruising and tenderness at the site. °· Follow instructions from your health care provider about how to take care of your radial site wound. Check the wound every day for signs of infection. °· Do not lift anything that is heavier than 10 lb (4.5 kg), or the limit that you are told, until your health care provider says  that it is safe. °This information is not intended to replace advice given to you by your health care provider. Make sure you discuss any questions you have with your health care provider. °Document Released: 04/02/2010 Document Revised: 04/05/2017 Document Reviewed: 04/05/2017 °Elsevier Interactive Patient Education © 2019 Elsevier Inc. ° °

## 2018-08-21 NOTE — Progress Notes (Addendum)
Discussed stent, Plavix/ASA/Eliquis, restrictions, diet, exercise (she is limited to short distances and swimming), NTG, and CRPII. Good reception. Will refer to [place]. She sts she is trying to convince her husband to get a pool. Pt is interested in participating in Virtual Cardiac Rehab. Pt advised that Virtual Cardiac Rehab is provided at no cost to the patient.  Checklist:  1. Pt has smart device  ie smartphone and/or ipad for downloading an app  Yes 2. Reliable internet/wifi service    Yes 3. Understands how to use their smartphone and navigate within an app.  Yes  Reviewed with pt the scheduling process for virtual cardiac rehab.  Pt verbalized understanding.  [place], ACSM 2:04 PM 08/21/2018

## 2018-08-22 ENCOUNTER — Encounter (HOSPITAL_COMMUNITY): Payer: Self-pay | Admitting: Cardiology

## 2018-08-24 ENCOUNTER — Other Ambulatory Visit: Payer: Self-pay

## 2018-08-24 ENCOUNTER — Ambulatory Visit (INDEPENDENT_AMBULATORY_CARE_PROVIDER_SITE_OTHER): Payer: Medicare Other | Admitting: Family Medicine

## 2018-08-24 ENCOUNTER — Telehealth: Payer: Self-pay | Admitting: Family Medicine

## 2018-08-24 ENCOUNTER — Encounter: Payer: Self-pay | Admitting: Family Medicine

## 2018-08-24 VITALS — BP 124/80 | HR 64 | Ht 61.0 in

## 2018-08-24 DIAGNOSIS — M7530 Calcific tendinitis of unspecified shoulder: Secondary | ICD-10-CM | POA: Diagnosis not present

## 2018-08-24 MED ORDER — PREDNISONE 5 MG PO TABS: ORAL_TABLET | ORAL | 0 refills | Status: DC

## 2018-08-24 NOTE — Progress Notes (Signed)
Heather Christensen - 69 y.o. female MRN [id number]  Date of birth: [birth year]  SUBJECTIVE:  Including CC & ROS.  Chief Complaint  Patient presents with  . Follow-up    R shoulder   I, Wendy Poet, LAT, ATC am serving as scribe for Dr. Raeford Razor.  Heather Christensen is a 68 y.o. female that is presenting for f/u of her R shoulder.  Pt states that she had a heart cath performed on 6/9 /20 w/ stent placement and reports that her R shoulder was placed in an aBducted position for the procedure.  Pt states she had immediate pain during the procedure and feels that it has worsened since. She is having constant 7/10 pain in her R shoulder that is radiating into her R upper arm.  Pt denies any mechanical symptoms.  She reports N/T into her R UE.  Aggravating factors: overhead R shoulder ROM; shld aBd; functional IR  Current treatments: Tylenol; Biofreeze; ice but all provide only temporary relief.  Meds: Tylenol and Gabapentin  Independent review of the right shoulder x-ray from 5/5 shows degenerative changes of the [facility] joint.  Independent review of the cervical spine x-ray from 5/5 shows mild degenerative changes at multiple levels.  Review of Systems  Constitutional: Negative for fever.  HENT: Negative for congestion.   Cardiovascular: Negative for chest pain.  Gastrointestinal: Negative for abdominal pain.  Musculoskeletal: Positive for gait problem.  Skin: Negative for color change.  Neurological: Positive for weakness.  Hematological: Negative for adenopathy.    HISTORY: Past Medical, Surgical, Social, and Family History Reviewed & Updated per EMR.   Pertinent Historical Findings include:  Past Medical History:  Diagnosis Date  . Alcohol abuse    Sober 19yrs   . Anemia    pernicious anemia  . Arthritis   . Asthma    seasonal, advair inhaler  . Complication of anesthesia    slow to wake  . Depression   . Dysrhythmia    palpitations, PVCs, SVT  . Eating disorder   . Heart  murmur   . Hypertension   . Hypothyroidism   . Migraines   . Thyroid disease     Past Surgical History:  Procedure Laterality Date  . BREAST SURGERY Right 2016   lumpectomy - begin  . CORONARY STENT INTERVENTION N/A 08/21/2018   Procedure: CORONARY STENT INTERVENTION;  Surgeon: Nigel Mormon, MD;  Location: [place] [facility];  Service: Cardiovascular;  Laterality: N/A;  . EYE SURGERY     cataracts  . LEFT HEART CATH AND CORONARY ANGIOGRAPHY N/A 08/21/2018   Procedure: LEFT HEART CATH AND CORONARY ANGIOGRAPHY;  Surgeon: Nigel Mormon, MD;  Location: [place] [facility];  Service: Cardiovascular;  Laterality: N/A;  . leg surgeries     fell leg got caught in a step stool and the toilet in the bathroom and crushed leg  . NECK SURGERY     lypoma removed from left side up behind ear (size of hand) beign  . REVERSE SHOULDER ARTHROPLASTY Left 11/09/2017   Procedure: REVERSE LEFT SHOULDER ARTHROPLASTY;  Surgeon: Justice Britain, MD;  Location: [place];  Service: Orthopedics;  Laterality: Left;  . SHOULDER SURGERY Left    rotator cuff, ball broke   . TONSILLECTOMY  1958    Allergies  Allergen Reactions  . Sulfa Antibiotics Other (See Comments)    STEVENS JOHNSON  . Sulfamethoxazole Other (See Comments)    STEVENS JOHNSON  . Quinolones Swelling and Other (See Comments)  SWELLING REACTION UNSPECIFIED    . Codeine Nausea And Vomiting    Family History  Problem Relation Age of Onset  . Arthritis Mother   . Depression Mother   . Heart disease Mother   . Hypertension Mother   . Stroke Mother   . Cervical cancer Mother   . Alcohol abuse Father   . Depression Father   . Drug abuse Father   . Heart disease Father   . Hypertension Father   . Lung cancer Father   . Parkinson's disease Sister   . COPD Sister   . Depression Sister   . Heart disease Sister   . Hypertension Sister   . Lung cancer Sister   . Heart disease Brother   . Hypertension Brother   . Brain cancer  Maternal Grandmother   . Depression Maternal Grandmother   . Heart disease Maternal Grandfather   . Alcohol abuse Paternal Grandmother   . Depression Paternal Grandmother      Social History   Socioeconomic History  . Marital status: Soil scientist    Spouse name: Not on file  . Number of children: 1  . Years of education: Not on file  . Highest education level: Not on file  Occupational History    Employer: [employer]  Social Needs  . Financial resource strain: Not on file  . Food insecurity    Worry: Not on file    Inability: Not on file  . Transportation needs    Medical: Not on file    Non-medical: Not on file  Tobacco Use  . Smoking status: Former Smoker    Packs/day: 1.00    Years: 10.00    Pack years: 10.00    Types: Cigarettes    Quit date: 07/05/1988    Years since quitting: 30.1  . Smokeless tobacco: Never Used  Substance and Sexual Activity  . Alcohol use: Never    Frequency: Never  . 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 Sexual activity: Yes    Partners: Male  Lifestyle  . Physical activity    Days per week: Not on file    Minutes per session: Not on file  . Stress: Not on file  Relationships  . Social Herbalist on phone: Not on file    Gets together: Not on file    Attends religious service: Not on file    Active member of club or organization: Not on file    Attends meetings of clubs or organizations: Not on file    Relationship status: Not on file  . Intimate partner violence    Fear of current or ex partner: Not on file    Emotionally abused: Not on file    Physically abused: Not on file    Forced sexual activity: Not on file  Other Topics Concern  . 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 She has history of alcohol abuse but has been sober for over 30 years.        PHYSICAL EXAM:  VS: BP 124/80 (BP Location: Left Arm, Patient  Position: Sitting, Cuff Size: Normal)   Pulse 64   Ht 5\' 1"  (1.549 m)   SpO2 96%   BMI 37.79 kg/m  Physical Exam Gen: NAD, alert, cooperative with exam, well-appearing ENT: normal lips, normal nasal mucosa,  Eye: normal EOM, normal conjunctiva and lids CV:  no edema, +2 pedal pulses   Resp: no accessory muscle use, non-labored,  Skin: no rashes, no areas of induration  Neuro: normal tone, normal sensation to touch Psych:  normal insight, alert and oriented MSK:  Right shoulder:  Normal active ROM  No TTP over the [facility] joint  Normal IR and ER  Normal strength to resistance  Pain with ER and abduction  Pain with empty can testing  Normal grip strength  Neurovascularly intact        ASSESSMENT & PLAN:   Calcific bursitis of shoulder Her pain seems to be exacerbated by her recent heart cath. Good ROM and strength.  - prednisone  - counseled no supportive care and HEP  - given indications to return.   The above documentation has been reviewed and is accurate and complete. Clearance Coots, MD 08/26/2018, 10:26 AM>

## 2018-08-24 NOTE — Telephone Encounter (Signed)
Copied from [place] [id number]. Topic: Appointment Scheduling - Scheduling Inquiry for Clinic >> Aug 24, 2018  9:42 AM Yvette Rack wrote: Reason for CRM: Pt called to schedule an appt with Dr. Tamala Julian due to some arm pain. Pt stated she had a procedure and her arm was in the same position for the entire time and now she can barely move it and would like an appt with Dr. Tamala Julian. Pt was also connected to voicemail to leave a message

## 2018-08-24 NOTE — Telephone Encounter (Signed)
Spoke to pt, scheduled her today with Dr. Raeford Razor at 240pm.

## 2018-08-24 NOTE — Patient Instructions (Signed)
Nice to meet you Please try the exercises  Please try ice.   Please send me a message in MyChart with any questions or updates.  Please see me back in 4 weeks if no better.   --Dr. Raeford Razor

## 2018-08-26 NOTE — Assessment & Plan Note (Signed)
Her pain seems to be exacerbated by her recent heart cath. Good ROM and strength.  - prednisone  - counseled no supportive care and HEP  - given indications to return.

## 2018-08-31 ENCOUNTER — Other Ambulatory Visit: Payer: Self-pay

## 2018-08-31 ENCOUNTER — Ambulatory Visit: Payer: Medicare Other | Admitting: Cardiology

## 2018-08-31 ENCOUNTER — Encounter: Payer: Self-pay | Admitting: Cardiology

## 2018-08-31 VITALS — BP 166/76 | HR 70 | Ht 61.0 in | Wt 196.0 lb

## 2018-08-31 DIAGNOSIS — Z955 Presence of coronary angioplasty implant and graft: Secondary | ICD-10-CM | POA: Diagnosis not present

## 2018-08-31 DIAGNOSIS — I25119 Atherosclerotic heart disease of native coronary artery with unspecified angina pectoris: Secondary | ICD-10-CM

## 2018-08-31 DIAGNOSIS — E669 Obesity, unspecified: Secondary | ICD-10-CM

## 2018-08-31 DIAGNOSIS — I480 Paroxysmal atrial fibrillation: Secondary | ICD-10-CM

## 2018-08-31 DIAGNOSIS — I10 Essential (primary) hypertension: Secondary | ICD-10-CM | POA: Diagnosis not present

## 2018-08-31 MED ORDER — SPIRONOLACTONE 25 MG PO TABS: 25.0000 mg | ORAL_TABLET | Freq: Every day | ORAL | 2 refills | Status: DC

## 2018-08-31 NOTE — Patient Instructions (Signed)
Aspirin/plavix/Eliquis for 1 month. Then, stop aspirin and continue plavix and eliquis for 6 months. After that, okay to stop plavix and resume aspirin.

## 2018-08-31 NOTE — Progress Notes (Signed)
Primary Physician:  [name], DO   Patient ID: Heather Christensen, female    DOB: 1951-01-19, 68 y.o.   MRN: [id number]  Subjective:    Chief Complaint  Patient presents with  . Hypertension  . Atrial Fibrillation  . Follow-up    HPI: Heather Christensen  is a 68 y.o. female  with hypertension, hyperlipidemia, hypothyroidism, paroxysmal atrial fibrillation by zio patch in Jan 2019 now on Eliquis, diastolic dysfunction, and CAD s/p PCI on 08/21/18 to proximal D1.   Patient has not had any further episodes of chest pain since PCI.  Does have dyspnea on exertion that is stable, states that she overall feels well. Has chronic leg swelling in left leg from previous injury. No bleeding problems. Right radial is healing well. Mild bruising.  She works as a Mining engineer. She has not been able to exercise yet.   Past Medical History:  Diagnosis Date  . Alcohol abuse    Sober 2yrs   . Anemia    pernicious anemia  . Arthritis   . Asthma    seasonal, advair inhaler  . Complication of anesthesia    slow to wake  . Depression   . Dysrhythmia    palpitations, PVCs, SVT  . Eating disorder   . Heart murmur   . Hypertension   . Hypothyroidism   . Migraines   . Thyroid disease     Past Surgical History:  Procedure Laterality Date  . BREAST SURGERY Right 2016   lumpectomy - begin  . CORONARY STENT INTERVENTION N/A 08/21/2018   Procedure: CORONARY STENT INTERVENTION;  Surgeon: Nigel Mormon, MD;  Location: [place] [facility];  Service: Cardiovascular;  Laterality: N/A;  . EYE SURGERY     cataracts  . LEFT HEART CATH AND CORONARY ANGIOGRAPHY N/A 08/21/2018   Procedure: LEFT HEART CATH AND CORONARY ANGIOGRAPHY;  Surgeon: Nigel Mormon, MD;  Location: [place] [facility];  Service: Cardiovascular;  Laterality: N/A;  . leg surgeries     fell leg got caught in a step stool and the toilet in the bathroom and crushed leg  . NECK SURGERY     lypoma removed from  left side up behind ear (size of hand) beign  . REVERSE SHOULDER ARTHROPLASTY Left 11/09/2017   Procedure: REVERSE LEFT SHOULDER ARTHROPLASTY;  Surgeon: Justice Britain, MD;  Location: [facility];  Service: Orthopedics;  Laterality: Left;  . SHOULDER SURGERY Left    rotator cuff, ball broke   . TONSILLECTOMY  1958    Social History   Socioeconomic History  . Marital status: Soil scientist    Spouse name: Not on file  . Number of children: 1  . Years of education: Not on file  . Highest education level: Not on file  Occupational History    Employer: [employer]  Social Needs  . Financial resource strain: Not on file  . Food insecurity    Worry: Not on file    Inability: Not on file  . Transportation needs    Medical: Not on file    Non-medical: Not on file  Tobacco Use  . Smoking status: Former Smoker    Packs/day: 1.00    Years: 10.00    Pack years: 10.00    Types: Cigarettes    Quit date: 07/05/1988    Years since quitting: 30.1  . Smokeless tobacco: Never Used  Substance and Sexual Activity  . Alcohol use: Never    Frequency: Never  .  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 Sexual activity: Yes    Partners: Male  Lifestyle  . Physical activity    Days per week: Not on file    Minutes per session: Not on file  . Stress: Not on file  Relationships  . Social Herbalist on phone: Not on file    Gets together: Not on file    Attends religious service: Not on file    Active member of club or organization: Not on file    Attends meetings of clubs or organizations: Not on file    Relationship status: Not on file  . Intimate partner violence    Fear of current or ex partner: Not on file    Emotionally abused: Not on file    Physically abused: Not on file    Forced sexual activity: Not on file  Other Topics Concern  . 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 She has  history of alcohol abuse but has been sober for over 30 years.       Review of Systems  Constitution: Negative for decreased appetite, malaise/fatigue, weight gain and weight loss.  Eyes: Negative for visual disturbance.  Cardiovascular: Positive for dyspnea on exertion and leg swelling (chronic). Negative for chest pain, claudication, orthopnea, palpitations and syncope.  Respiratory: Negative for hemoptysis and wheezing.   Endocrine: Negative for cold intolerance and heat intolerance.  Hematologic/Lymphatic: Negative for bleeding problem. Bruises/bleeds easily (some bruising since being on Eliquis).  Skin: Negative for nail changes.  Musculoskeletal: Positive for joint pain (left knee from previous injury). Negative for muscle weakness and myalgias.  Gastrointestinal: Negative for abdominal pain, change in bowel habit, nausea and vomiting.  Neurological: Negative for difficulty with concentration, dizziness, focal weakness and headaches.  Psychiatric/Behavioral: Negative for altered mental status and suicidal ideas.  All other systems reviewed and are negative.     Objective:  There were no vitals taken for this visit. There is no height or weight on file to calculate BMI.    Physical Exam  Constitutional: She is oriented to person, place, and time. Vital signs are normal. She appears well-developed and well-nourished.  HENT:  Head: Normocephalic and atraumatic.  Neck: Normal range of motion.  Cardiovascular: Normal rate, regular rhythm and intact distal pulses.  Murmur heard.  Early systolic murmur is present with a grade of 2/6 at the upper right sternal border. Pulses:      Radial pulses are 2+ on the right side and 2+ on the left side.  Mild ecchymosis to right radial area  Pulmonary/Chest: Effort normal and breath sounds normal. No accessory muscle usage. No respiratory distress.  Abdominal: Soft. Bowel sounds are normal.  Musculoskeletal: Normal range of motion.   Neurological: She is alert and oriented to person, place, and time.  Skin: Skin is warm and dry.  Vitals reviewed.  Radiology: No results found.  Laboratory examination:    CMP Latest Ref Rng & Units 08/21/2018 08/01/2018 08/01/2018  Glucose 70 - 99 mg/dL 105(H) 98 100(H)  BUN 8 - 23 mg/dL 20 17 17   Creatinine 0.44 - 1.00 mg/dL 1.04(H) 0.91 0.88  Sodium 135 - 145 mmol/L 143 141 143  Potassium 3.5 - 5.1 mmol/L 5.0 4.4 4.1  Chloride 98 - 111 mmol/L 110 103 104  CO2 22 - 32 mmol/L 24 28 29   Calcium 8.9 - 10.3  mg/dL 9.1 9.3 9.3  Total Protein 6.0 - 8.3 g/dL - - 6.7  Total Bilirubin 0.2 - 1.2 mg/dL - - 0.5  Alkaline Phos 39 - 117 U/L - - 55  AST 0 - 37 U/L - - 22  ALT 0 - 35 U/L - - 20   CBC Latest Ref Rng & Units 08/21/2018 04/27/2018 11/01/2017  WBC 4.0 - 10.5 K/uL 4.6 4.4 6.0  Hemoglobin 12.0 - 15.0 g/dL 13.1 13.0 14.0  Hematocrit 36.0 - 46.0 % 39.5 42 44.1  Platelets 150 - 400 K/uL 247 231 250   Lipid Panel     Component Value Date/Time   CHOL 145 08/01/2018 1431   TRIG 82.0 08/01/2018 1431   HDL 54.70 08/01/2018 1431   CHOLHDL 3 08/01/2018 1431   VLDL 16.4 08/01/2018 1431   LDLCALC 74 08/01/2018 1431   HEMOGLOBIN A1C No results found for: HGBA1C, MPG TSH Recent Labs    04/27/18  TSH 2.38    PRN Meds:. There are no discontinued medications. No outpatient medications have been marked as taking for the 08/31/18 encounter (Office Visit) with Miquel Dunn, NP.    Cardiac Studies:   Cath 08/21/2018: LM: Short vessel. Mild disease LAD: Mild to moderate proximal and mid disease        Medium sized D1 with proximal 80% stenosis LCx: Dominant vessel. No significant disease RCA: Small, nondominant vessel. No significant disease Elevated LVEDP.  Successful percutaneous coronary intervention prox D1 PTCA and stent placement Resolute Onyx 2.0 X 15 mm drug-eluting stent  Coronary CTA with Calcium score 07/05/2018:  1. Coronary calcium score of 276. This was 89th  percentile for age and sex matched control. 2. Normal coronary origin with left dominance. 3. There is severe (>70%) obstruction in proximal D1. 4. There is mild to moderate plaque in the mid LAD. 5. There is non-obstructive plaque in LM. 6. Recommend cardiac catheterization. 7. Recommend aggressive risk factor reduction and high potency statin.  Echocardiogram  03/16/2018: Left ventricle cavity is normal in size. Mild concentric hypertrophy of the left ventricle. Normal global wall motion. Doppler evidence of grade II (pseudonormal) diastolic dysfunction, elevated LAP. Calculated EF 55%. Left atrial cavity is mildly dilated. at 4.0 cm. Trileaflet aortic valve with mild (Grade I) regurgitation. Structurally normal mitral valve with trace regurgitation. Mild tricuspid regurgitation. No evidence of pulmonary hypertension.  14 day event monitor 02/23/2018-03/08/2018: Dominant rhythm normal sinus. 18 patient triggered events occurred with reported symptoms of chest pain, shortness of breath, rapid/fast heart rate, tiredness/fatigue, that correlated with normal sinus rhythm with occasional PAC. No A. fib or atrial flutter was noted.   10 day event monitor 03/31/2017-04/10/2017:1% atrial fibrillation with a 34 minute episode. Multiple short runs of SVT. Occasional PVCs.    Assessment:     ICD-10-CM   1. Atherosclerosis of native coronary artery of native heart with angina pectoris ([place])  I25.119   2. Paroxysmal atrial fibrillation (H[facility])  I48.0 EKG 12-Lead  3. Essential hypertension  I10   4. Status post insertion of drug eluting coronary artery stent  Z95.5   5. Obesity (BMI 30-39.9)  E66.9     EKG 08/31/2018: Sinus bradycardia at 57 bpm, normal axis, anteroseptal T wave flattening, cannot exclude ischemia.  Recommendations:   Patient has not had any further symptoms of angina since PCI. I do suspect that her dyspnea and fatigue are multifactoral and may still be present  post-PCI.  EKG is stable.  She is currently on aspirin, Plavix, and  Eliquis and will continue for 1 month, then, stop aspirin and continue plavix and eliquis for 6 months. After that, okay to stop plavix and resume aspirin. Right radial access site healing well, no hematoma.   Blood pressure is elevated and had elevated diastolic pressures on cath. Will add Aldactone 25 mg for diastolic dysfunction and hypertension. Will consider repeating echocardiogram once BP is controlled. Will check BMP in 10 days for surveillance of kidney function, which patient prefers to have performed at PCP office. Will fax order. I will refer to cardiac rehab. Encouraged her to work toward weight loss. Will follow up in 6 weeks for follow up on hypertension.   Miquel Dunn, MSN, APRN, FNP-C [facility]. [place] Office: [phone] Fax: [phone]

## 2018-09-01 ENCOUNTER — Encounter: Payer: Self-pay | Admitting: Cardiology

## 2018-09-03 ENCOUNTER — Telehealth (HOSPITAL_COMMUNITY): Payer: Self-pay

## 2018-09-03 NOTE — Telephone Encounter (Signed)
Dr. Virgina Jock  As you are aware our department remains closed to patients due to Covid-19.  We are excited to be able to offer an alternative to traditional onsite Cardiac Rehab while your patient continues to follow [employer] guidelines. This is a notification that your patient has been contacted and is very interested in participating in Virtual Cardiac Rehab.  Thank you for your continued support in helping [place] meet the health care needs of our patients.  Jessica C.  Cardiac Rehab staff

## 2018-09-03 NOTE — Telephone Encounter (Signed)

## 2018-09-04 ENCOUNTER — Telehealth (HOSPITAL_COMMUNITY): Payer: Self-pay | Admitting: *Deleted

## 2018-09-04 ENCOUNTER — Encounter (HOSPITAL_COMMUNITY): Payer: Self-pay

## 2018-09-06 ENCOUNTER — Telehealth (HOSPITAL_COMMUNITY): Payer: Self-pay | Admitting: *Deleted

## 2018-09-07 ENCOUNTER — Ambulatory Visit: Payer: Medicare Other | Admitting: Cardiology

## 2018-09-21 ENCOUNTER — Other Ambulatory Visit: Payer: Self-pay

## 2018-09-21 ENCOUNTER — Encounter: Payer: Self-pay | Admitting: Family Medicine

## 2018-09-21 DIAGNOSIS — E039 Hypothyroidism, unspecified: Secondary | ICD-10-CM

## 2018-09-26 ENCOUNTER — Other Ambulatory Visit (INDEPENDENT_AMBULATORY_CARE_PROVIDER_SITE_OTHER): Payer: Medicare Other

## 2018-09-26 ENCOUNTER — Other Ambulatory Visit: Payer: Self-pay

## 2018-09-26 DIAGNOSIS — E039 Hypothyroidism, unspecified: Secondary | ICD-10-CM | POA: Diagnosis not present

## 2018-09-26 DIAGNOSIS — I480 Paroxysmal atrial fibrillation: Secondary | ICD-10-CM

## 2018-09-26 MED ORDER — NITROGLYCERIN 0.4 MG SL SUBL: 0.4000 mg | SUBLINGUAL_TABLET | SUBLINGUAL | 2 refills | Status: AC | PRN

## 2018-09-27 LAB — TSH: TSH: 0.8 u[IU]/mL (ref 0.35–4.50)

## 2018-09-27 LAB — T4, FREE: Free T4: 1.12 ng/dL (ref 0.60–1.60)

## 2018-10-01 ENCOUNTER — Telehealth (HOSPITAL_COMMUNITY): Payer: Self-pay | Admitting: *Deleted

## 2018-10-01 NOTE — Telephone Encounter (Signed)
Pt inactive in [facility].  Letter mailed requesting patient to return call regarding this by 10/05/2018. If no response, will discharge from cardiac rehab program.  

## 2018-10-02 DIAGNOSIS — E039 Hypothyroidism, unspecified: Secondary | ICD-10-CM | POA: Diagnosis not present

## 2018-10-02 DIAGNOSIS — N951 Menopausal and female climacteric states: Secondary | ICD-10-CM | POA: Diagnosis not present

## 2018-10-02 DIAGNOSIS — I10 Essential (primary) hypertension: Secondary | ICD-10-CM | POA: Diagnosis not present

## 2018-10-09 DIAGNOSIS — I10 Essential (primary) hypertension: Secondary | ICD-10-CM | POA: Diagnosis not present

## 2018-10-09 DIAGNOSIS — N951 Menopausal and female climacteric states: Secondary | ICD-10-CM | POA: Diagnosis not present

## 2018-10-09 DIAGNOSIS — R7303 Prediabetes: Secondary | ICD-10-CM | POA: Diagnosis not present

## 2018-10-10 NOTE — Telephone Encounter (Signed)
Please read

## 2018-10-17 ENCOUNTER — Other Ambulatory Visit: Payer: Self-pay

## 2018-10-17 ENCOUNTER — Telehealth: Payer: Self-pay

## 2018-10-17 ENCOUNTER — Ambulatory Visit (INDEPENDENT_AMBULATORY_CARE_PROVIDER_SITE_OTHER): Payer: Medicare Other | Admitting: Cardiology

## 2018-10-17 ENCOUNTER — Encounter: Payer: Self-pay | Admitting: Cardiology

## 2018-10-17 VITALS — BP 132/54 | HR 68 | Temp 97.3°F | Ht 61.0 in | Wt 223.2 lb

## 2018-10-17 DIAGNOSIS — Z6841 Body Mass Index (BMI) 40.0 and over, adult: Secondary | ICD-10-CM

## 2018-10-17 DIAGNOSIS — R400 Somnolence: Secondary | ICD-10-CM | POA: Diagnosis not present

## 2018-10-17 DIAGNOSIS — I480 Paroxysmal atrial fibrillation: Secondary | ICD-10-CM | POA: Diagnosis not present

## 2018-10-17 DIAGNOSIS — R5381 Other malaise: Secondary | ICD-10-CM | POA: Diagnosis not present

## 2018-10-17 DIAGNOSIS — I10 Essential (primary) hypertension: Secondary | ICD-10-CM | POA: Diagnosis not present

## 2018-10-17 DIAGNOSIS — I25119 Atherosclerotic heart disease of native coronary artery with unspecified angina pectoris: Secondary | ICD-10-CM

## 2018-10-17 DIAGNOSIS — R5383 Other fatigue: Secondary | ICD-10-CM

## 2018-10-17 NOTE — Progress Notes (Signed)
Primary Physician:  [name], DO   Patient ID: Heather Christensen, female    DOB: Mar 26, 1950, 68 y.o.   MRN: [id number]  Subjective:    Chief Complaint  Patient presents with  . Atrial Fibrillation    HPI: Heather Christensen  is a 68 y.o. female  with hypertension, hyperlipidemia, hypothyroidism, paroxysmal atrial fibrillation by zio patch in Jan 2019 now on Eliquis, diastolic dysfunction, and CAD s/p PCI on 08/21/18 to proximal D1. Patient  presents to our office on an urgent basis, this morning felt she was having rapid palpitations lasting several minutes and also mild chest discomfort.    Patient has  Had occasional episodes of chest pain since PCI and has used S/L NTG with relief. Does have dyspnea on exertion that is stable  Past Medical History:  Diagnosis Date  . Alcohol abuse    Sober 69yrs   . Anemia    pernicious anemia  . Arthritis   . Asthma    seasonal, advair inhaler  . Complication of anesthesia    slow to wake  . Depression   . Dysrhythmia    palpitations, PVCs, SVT  . Eating disorder   . Heart murmur   . Hypertension   . Hypothyroidism   . Migraines   . Thyroid disease     Past Surgical History:  Procedure Laterality Date  . BREAST SURGERY Right 2016   lumpectomy - begin  . CORONARY STENT INTERVENTION N/A 08/21/2018   Procedure: CORONARY STENT INTERVENTION;  Surgeon: Nigel Mormon, MD;  Location: [facility];  Service: Cardiovascular;  Laterality: N/A;  . EYE SURGERY     cataracts  . LEFT HEART CATH AND CORONARY ANGIOGRAPHY N/A 08/21/2018   Procedure: LEFT HEART CATH AND CORONARY ANGIOGRAPHY;  Surgeon: Nigel Mormon, MD;  Location: [facility];  Service: Cardiovascular;  Laterality: N/A;  . leg surgeries     fell leg got caught in a step stool and the toilet in the bathroom and crushed leg  . NECK SURGERY     lypoma removed from left side up behind ear (size of hand) beign  . REVERSE SHOULDER ARTHROPLASTY Left  11/09/2017   Procedure: REVERSE LEFT SHOULDER ARTHROPLASTY;  Surgeon: Justice Britain, MD;  Location: [facility];  Service: Orthopedics;  Laterality: Left;  . SHOULDER SURGERY Left    rotator cuff, ball broke   . TONSILLECTOMY  1958    Social History   Socioeconomic History  . Marital status: Soil scientist    Spouse name: Not on file  . Number of children: 1  . Years of education: Not on file  . Highest education level: Not on file  Occupational History    Employer: [employer]  Social Needs  . Financial resource strain: Not on file  . Food insecurity    Worry: Not on file    Inability: Not on file  . Transportation needs    Medical: Not on file    Non-medical: Not on file  Tobacco Use  . Smoking status: Former Smoker    Packs/day: 1.00    Years: 10.00    Pack years: 10.00    Types: Cigarettes    Quit date: 07/05/1988    Years since quitting: 30.3  . Smokeless tobacco: Never Used  Substance and Sexual Activity  . Alcohol use: Never    Frequency: Never  . 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 Sexual activity: Yes  Partners: Male  Lifestyle  . Physical activity    Days per week: Not on file    Minutes per session: Not on file  . Stress: Not on file  Relationships  . Social Herbalist on phone: Not on file    Gets together: Not on file    Attends religious service: Not on file    Active member of club or organization: Not on file    Attends meetings of clubs or organizations: Not on file    Relationship status: Not on file  . Intimate partner violence    Fear of current or ex partner: Not on file    Emotionally abused: Not on file    Physically abused: Not on file    Forced sexual activity: Not on file  Other Topics Concern  . 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 She has history of alcohol abuse but has been sober for over 30 years.       Review of  Systems  Constitution: Negative for decreased appetite, malaise/fatigue, weight gain and weight loss.  Eyes: Negative for visual disturbance.  Cardiovascular: Positive for chest pain, dyspnea on exertion, leg swelling (chronic left leg since prior surgery) and palpitations. Negative for claudication, orthopnea and syncope.  Respiratory: Negative for hemoptysis and wheezing.   Endocrine: Negative for cold intolerance and heat intolerance.  Hematologic/Lymphatic: Negative for bleeding problem. Bruises/bleeds easily (some bruising since being on Eliquis).  Skin: Negative for nail changes.  Musculoskeletal: Positive for joint pain (left knee from previous injury). Negative for muscle weakness and myalgias.  Gastrointestinal: Negative for abdominal pain, change in bowel habit, nausea and vomiting.  Neurological: Negative for difficulty with concentration, dizziness, focal weakness and headaches.  Psychiatric/Behavioral: Negative for altered mental status and suicidal ideas.  All other systems reviewed and are negative.     Objective:  Blood pressure (!) 132/54, pulse 68, temperature (!) 97.3 F (36.3 C), height 5\' 1"  (1.549 m), weight 223 lb 3.2 oz (101.2 kg), peak flow 97 L/min. Body mass index is 42.17 kg/m.    Physical Exam  Constitutional: She is oriented to person, place, and time. Vital signs are normal.  Moderately built and morbidly obese  HENT:  Head: Normocephalic and atraumatic.  Neck: Normal range of motion.  Cardiovascular: Normal rate, regular rhythm, intact distal pulses and normal pulses.  Murmur heard.  Early systolic murmur is present with a grade of 2/6 at the upper right sternal border. Pulses:      Radial pulses are 2+ on the right side and 2+ on the left side.  Left leg 2+ pitting ankle edema.   Pulmonary/Chest: Effort normal and breath sounds normal. No accessory muscle usage. No respiratory distress.  Abdominal: Soft. Bowel sounds are normal.  Musculoskeletal:  Normal range of motion.  Neurological: She is alert and oriented to person, place, and time.  Skin: Skin is warm and dry.  Vitals reviewed.  Radiology: No results found.  Laboratory examination:    CMP Latest Ref Rng & Units 08/21/2018 08/01/2018 08/01/2018  Glucose 70 - 99 mg/dL 105(H) 98 100(H)  BUN 8 - 23 mg/dL 20 17 17   Creatinine 0.44 - 1.00 mg/dL 1.04(H) 0.91 0.88  Sodium 135 - 145 mmol/L 143 141 143  Potassium 3.5 - 5.1 mmol/L 5.0 4.4 4.1  Chloride 98 - 111 mmol/L 110 103 104  CO2 22 - 32 mmol/L 24 28 29  Calcium 8.9 - 10.3 mg/dL 9.1 9.3 9.3  Total Protein 6.0 - 8.3 g/dL - - 6.7  Total Bilirubin 0.2 - 1.2 mg/dL - - 0.5  Alkaline Phos 39 - 117 U/L - - 55  AST 0 - 37 U/L - - 22  ALT 0 - 35 U/L - - 20   CBC Latest Ref Rng & Units 08/21/2018 04/27/2018 11/01/2017  WBC 4.0 - 10.5 K/uL 4.6 4.4 6.0  Hemoglobin 12.0 - 15.0 g/dL 13.1 13.0 14.0  Hematocrit 36.0 - 46.0 % 39.5 42 44.1  Platelets 150 - 400 K/uL 247 231 250   Lipid Panel     Component Value Date/Time   CHOL 145 08/01/2018 1431   TRIG 82.0 08/01/2018 1431   HDL 54.70 08/01/2018 1431   CHOLHDL 3 08/01/2018 1431   VLDL 16.4 08/01/2018 1431   LDLCALC 74 08/01/2018 1431   HEMOGLOBIN A1C No results found for: HGBA1C, MPG TSH Recent Labs    04/27/18 09/26/18 1458  TSH 2.38 0.80    PRN Meds:. There are no discontinued medications. Current Meds  Medication Sig  . albuterol (PROVENTIL HFA;VENTOLIN HFA) 108 (90 Base) MCG/ACT inhaler Inhale 1-2 puffs into the lungs every 6 (six) hours as needed for wheezing or shortness of breath.  Marland Kitchen amLODipine (NORVASC) 5 MG tablet TAKE 1 TABLET(5 MG) BY MOUTH DAILY  . aspirin 81 MG chewable tablet Chew 81 mg by mouth daily.  Marland Kitchen atorvastatin (LIPITOR) 10 MG tablet TAKE 1 TABLET BY MOUTH EVERY DAY  . citalopram (CELEXA) 40 MG tablet Take 1 tablet (40 mg total) by mouth at bedtime.  . clopidogrel (PLAVIX) 75 MG tablet Take 1 tablet (75 mg total) by mouth daily with breakfast.  .  cyanocobalamin (,VITAMIN B-12,) 1000 MCG/ML injection 1000 mcg (1 mg) injection once per month.  Arne Cleveland 5 MG TABS tablet TAKE 1 TABLET BY MOUTH  TWICE A DAY  . fluticasone (FLONASE) 50 MCG/ACT nasal spray Place 2 sprays into both nostrils daily.    Cardiac Studies:   Cath 08/21/2018: LM: Short vessel. Mild disease LAD: Mild to moderate proximal and mid disease. Medium sized D1 with proximal 80% stenosis LCx: Dominant vessel. No significant disease RCA: Small, nondominant vessel. No significant disease Elevated LVEDP.  Successful percutaneous coronary intervention prox D1 PTCA and stent placement Resolute Onyx 2.0 X 15 mm drug-eluting stent  Coronary CTA with Calcium score 07/05/2018:  1. Coronary calcium score of 276. This was 89th percentile for age and sex matched control. 2. Normal coronary origin with left dominance. 3. There is severe (>70%) obstruction in proximal D1. 4. There is mild to moderate plaque in the mid LAD. 5. There is non-obstructive plaque in LM. 6. Recommend cardiac catheterization. 7. Recommend aggressive risk factor reduction and high potency statin.  Echocardiogram  03/16/2018: Left ventricle cavity is normal in size. Mild concentric hypertrophy of the left ventricle. Normal global wall motion. Doppler evidence of grade II (pseudonormal) diastolic dysfunction, elevated LAP. Calculated EF 55%. Left atrial cavity is mildly dilated. at 4.0 cm. Trileaflet aortic valve with mild (Grade I) regurgitation. Structurally normal mitral valve with trace regurgitation. Mild tricuspid regurgitation. No evidence of pulmonary hypertension.  14 day event monitor 02/23/2018-03/08/2018: Dominant rhythm normal sinus. 18 patient triggered events occurred with reported symptoms of chest pain, shortness of breath, rapid/fast heart rate, tiredness/fatigue, that correlated with normal sinus rhythm with occasional PAC. No A. fib or atrial flutter was noted.   10 day event  monitor 03/31/2017-04/10/2017:1% atrial fibrillation with a  34 minute episode. Multiple short runs of SVT. Occasional PVCs.    Assessment:     ICD-10-CM   1. Paroxysmal atrial fibrillation (HCC)  I48.0 EKG 12-Lead    Ambulatory referral to Sleep Studies   CHA2DS2-VASc Score is 3.  Yearly risk of stroke: 3.2%.   2. Essential hypertension  I10   3. Atherosclerosis of native coronary artery of native heart with angina pectoris ([place])  I25.119   4. Malaise and fatigue  R53.81 Ambulatory referral to Sleep Studies   R53.83   5. Daytime somnolence  R40.0 Ambulatory referral to Sleep Studies  6. Class 3 severe obesity due to excess calories with serious comorbidity and body mass index (BMI) of 40.0 to 44.9 in adult [facility])  E66.01 Ambulatory referral to Sleep Studies   Z68.41    EKG 10/17/2018: Normal sinus rhythm at the rate of 60 bpm, normal axis, nonspecific T abnormality.  Normal QT interval. No significant change from  EKG 08/31/2018.  Recommendations:   Patient with known coronary artery disease, morbid obesity, hypertension who presents to our office on an urgent basis, this morning felt she was having rapid palpitations lasting several minutes and also mild chest discomfort.  She is presently asymptomatic and has started to feel better.  I reviewed the EKG with the patient and she is presently in sinus rhythm.  Continue present medications.  Continue Plavix on Eliquis for a total of 6 months until December 2020 in view of DES stent in the D1.  She has morbid obesity, daytime somnolence and fatigue which may all be related to sleep apnea.  I have referred her for sleep study.  Otherwise she will continue to keep her previous appointments with [place], I did not make any changes to her medications, blood pressure is well controlled and she is on appropriate medical therapy including therapy for hyperlipidemia.  Adrian Prows, MD, [facility] 10/17/2018, 12:48 PM [facility]. [place] Pager: [phone]  Office: [phone] If no answer Cell [phone]

## 2018-10-17 NOTE — Telephone Encounter (Signed)
Can see if she can come in at noon?

## 2018-10-17 NOTE — Telephone Encounter (Signed)
Pt called wanting to know if she could be seen by you today due to cp, sob and dizziness starting this morning. She thinks she's in afib. Please advise.//ah

## 2018-10-17 NOTE — Telephone Encounter (Signed)
Pt aware. On the way.//ah

## 2018-10-24 DIAGNOSIS — I10 Essential (primary) hypertension: Secondary | ICD-10-CM | POA: Diagnosis not present

## 2018-10-26 NOTE — Telephone Encounter (Signed)
Please read

## 2018-10-27 ENCOUNTER — Inpatient Hospital Stay (HOSPITAL_COMMUNITY)
Admission: EM | Admit: 2018-10-27 | Discharge: 2018-10-29 | DRG: 378 | Disposition: A | Discharge status: Home / Self Care | Payer: Medicare Other | Attending: Internal Medicine | Admitting: Internal Medicine

## 2018-10-27 ENCOUNTER — Other Ambulatory Visit: Payer: Self-pay

## 2018-10-27 DIAGNOSIS — Z8659 Personal history of other mental and behavioral disorders: Secondary | ICD-10-CM

## 2018-10-27 DIAGNOSIS — Z20828 Contact with and (suspected) exposure to other viral communicable diseases: Secondary | ICD-10-CM | POA: Diagnosis present

## 2018-10-27 DIAGNOSIS — Z808 Family history of malignant neoplasm of other organs or systems: Secondary | ICD-10-CM

## 2018-10-27 DIAGNOSIS — Z801 Family history of malignant neoplasm of trachea, bronchus and lung: Secondary | ICD-10-CM

## 2018-10-27 DIAGNOSIS — G8929 Other chronic pain: Secondary | ICD-10-CM | POA: Diagnosis present

## 2018-10-27 DIAGNOSIS — Z7982 Long term (current) use of aspirin: Secondary | ICD-10-CM

## 2018-10-27 DIAGNOSIS — Z7902 Long term (current) use of antithrombotics/antiplatelets: Secondary | ICD-10-CM

## 2018-10-27 DIAGNOSIS — Z8249 Family history of ischemic heart disease and other diseases of the circulatory system: Secondary | ICD-10-CM

## 2018-10-27 DIAGNOSIS — E669 Obesity, unspecified: Secondary | ICD-10-CM | POA: Diagnosis present

## 2018-10-27 DIAGNOSIS — Z9884 Bariatric surgery status: Secondary | ICD-10-CM | POA: Diagnosis not present

## 2018-10-27 DIAGNOSIS — R0602 Shortness of breath: Secondary | ICD-10-CM | POA: Diagnosis not present

## 2018-10-27 DIAGNOSIS — E785 Hyperlipidemia, unspecified: Secondary | ICD-10-CM | POA: Diagnosis present

## 2018-10-27 DIAGNOSIS — E079 Disorder of thyroid, unspecified: Secondary | ICD-10-CM | POA: Diagnosis present

## 2018-10-27 DIAGNOSIS — K921 Melena: Secondary | ICD-10-CM | POA: Diagnosis present

## 2018-10-27 DIAGNOSIS — I480 Paroxysmal atrial fibrillation: Secondary | ICD-10-CM | POA: Diagnosis not present

## 2018-10-27 DIAGNOSIS — F329 Major depressive disorder, single episode, unspecified: Secondary | ICD-10-CM | POA: Diagnosis present

## 2018-10-27 DIAGNOSIS — Z8049 Family history of malignant neoplasm of other genital organs: Secondary | ICD-10-CM

## 2018-10-27 DIAGNOSIS — M25519 Pain in unspecified shoulder: Secondary | ICD-10-CM | POA: Diagnosis present

## 2018-10-27 DIAGNOSIS — I10 Essential (primary) hypertension: Secondary | ICD-10-CM | POA: Diagnosis present

## 2018-10-27 DIAGNOSIS — M1990 Unspecified osteoarthritis, unspecified site: Secondary | ICD-10-CM | POA: Diagnosis present

## 2018-10-27 DIAGNOSIS — K253 Acute gastric ulcer without hemorrhage or perforation: Secondary | ICD-10-CM | POA: Diagnosis present

## 2018-10-27 DIAGNOSIS — K259 Gastric ulcer, unspecified as acute or chronic, without hemorrhage or perforation: Secondary | ICD-10-CM | POA: Diagnosis not present

## 2018-10-27 DIAGNOSIS — K254 Chronic or unspecified gastric ulcer with hemorrhage: Secondary | ICD-10-CM | POA: Diagnosis not present

## 2018-10-27 DIAGNOSIS — Z6841 Body Mass Index (BMI) 40.0 and over, adult: Secondary | ICD-10-CM

## 2018-10-27 DIAGNOSIS — E039 Hypothyroidism, unspecified: Secondary | ICD-10-CM | POA: Diagnosis present

## 2018-10-27 DIAGNOSIS — D62 Acute posthemorrhagic anemia: Secondary | ICD-10-CM | POA: Diagnosis not present

## 2018-10-27 DIAGNOSIS — J45909 Unspecified asthma, uncomplicated: Secondary | ICD-10-CM | POA: Diagnosis not present

## 2018-10-27 DIAGNOSIS — Z885 Allergy status to narcotic agent status: Secondary | ICD-10-CM

## 2018-10-27 DIAGNOSIS — Z825 Family history of asthma and other chronic lower respiratory diseases: Secondary | ICD-10-CM

## 2018-10-27 DIAGNOSIS — Z888 Allergy status to other drugs, medicaments and biological substances status: Secondary | ICD-10-CM

## 2018-10-27 DIAGNOSIS — I2510 Atherosclerotic heart disease of native coronary artery without angina pectoris: Secondary | ICD-10-CM | POA: Diagnosis present

## 2018-10-27 DIAGNOSIS — Z823 Family history of stroke: Secondary | ICD-10-CM

## 2018-10-27 DIAGNOSIS — F1011 Alcohol abuse, in remission: Secondary | ICD-10-CM | POA: Diagnosis present

## 2018-10-27 DIAGNOSIS — K922 Gastrointestinal hemorrhage, unspecified: Secondary | ICD-10-CM | POA: Diagnosis present

## 2018-10-27 DIAGNOSIS — Z955 Presence of coronary angioplasty implant and graft: Secondary | ICD-10-CM | POA: Diagnosis not present

## 2018-10-27 DIAGNOSIS — Z8261 Family history of arthritis: Secondary | ICD-10-CM

## 2018-10-27 DIAGNOSIS — Z818 Family history of other mental and behavioral disorders: Secondary | ICD-10-CM

## 2018-10-27 DIAGNOSIS — D649 Anemia, unspecified: Secondary | ICD-10-CM | POA: Diagnosis not present

## 2018-10-27 DIAGNOSIS — Z882 Allergy status to sulfonamides status: Secondary | ICD-10-CM

## 2018-10-27 DIAGNOSIS — Z7901 Long term (current) use of anticoagulants: Secondary | ICD-10-CM | POA: Diagnosis not present

## 2018-10-27 DIAGNOSIS — G43909 Migraine, unspecified, not intractable, without status migrainosus: Secondary | ICD-10-CM | POA: Diagnosis not present

## 2018-10-27 DIAGNOSIS — Z811 Family history of alcohol abuse and dependence: Secondary | ICD-10-CM

## 2018-10-27 DIAGNOSIS — Z79890 Hormone replacement therapy (postmenopausal): Secondary | ICD-10-CM

## 2018-10-27 DIAGNOSIS — Z87891 Personal history of nicotine dependence: Secondary | ICD-10-CM

## 2018-10-27 DIAGNOSIS — Z79899 Other long term (current) drug therapy: Secondary | ICD-10-CM

## 2018-10-27 LAB — BASIC METABOLIC PANEL
Anion gap: 8 (ref 5–15)
BUN: 18 mg/dL (ref 8–23)
CO2: 23 mmol/L (ref 22–32)
Calcium: 8.7 mg/dL — ABNORMAL LOW (ref 8.9–10.3)
Chloride: 108 mmol/L (ref 98–111)
Creatinine, Ser: 0.86 mg/dL (ref 0.44–1.00)
GFR calc Af Amer: >60 mL/min (ref >60)
GFR calc non Af Amer: >60 mL/min (ref >60)
Glucose, Bld: 116 mg/dL — ABNORMAL HIGH (ref 70–99)
Potassium: 4.3 mmol/L (ref 3.5–5.1)
Sodium: 139 mmol/L (ref 135–145)

## 2018-10-27 LAB — URINALYSIS, ROUTINE W REFLEX MICROSCOPIC
Bilirubin Urine: NEGATIVE
Glucose, UA: NEGATIVE mg/dL
Hgb urine dipstick: NEGATIVE
Ketones, ur: NEGATIVE mg/dL
Nitrite: POSITIVE — AB
Protein, ur: NEGATIVE mg/dL
Specific Gravity, Urine: 1.01 (ref 1.005–1.030)
pH: 5 (ref 5.0–8.0)

## 2018-10-27 LAB — CBC
HCT: 20.3 % — ABNORMAL LOW (ref 36.0–46.0)
Hemoglobin: 6.8 g/dL — CL (ref 12.0–15.0)
MCH: 32.4 pg (ref 26.0–34.0)
MCHC: 33.5 g/dL (ref 30.0–36.0)
MCV: 96.7 fL (ref 80.0–100.0)
Platelets: 235 10*3/uL (ref 150–400)
RBC: 2.1 MIL/uL — ABNORMAL LOW (ref 3.87–5.11)
RDW: 13.8 % (ref 11.5–15.5)
WBC: 7.4 10*3/uL (ref 4.0–10.5)
nRBC: 0.9 % — ABNORMAL HIGH (ref 0.0–0.2)

## 2018-10-27 LAB — POC OCCULT BLOOD, ED: Fecal Occult Bld: POSITIVE — AB

## 2018-10-27 LAB — SARS CORONAVIRUS 2 BY RT PCR (HOSPITAL ORDER, PERFORMED IN ~~LOC~~ HOSPITAL LAB): SARS Coronavirus 2: NEGATIVE

## 2018-10-27 LAB — PREPARE RBC (CROSSMATCH)

## 2018-10-27 LAB — PROTIME-INR
INR: 1.3 — ABNORMAL HIGH (ref 0.8–1.2)
Prothrombin Time: 16.4 seconds — ABNORMAL HIGH (ref 11.4–15.2)

## 2018-10-27 LAB — ABO/RH: ABO/RH(D): A POS

## 2018-10-27 LAB — CBG MONITORING, ED: Glucose-Capillary: 115 mg/dL — ABNORMAL HIGH (ref 70–99)

## 2018-10-27 MED ORDER — SODIUM CHLORIDE 0.9 % IV SOLN
8.0000 mg/h | INTRAVENOUS | Status: DC
  Administered 2018-10-27 – 2018-10-28 (×3): 8 mg/h via INTRAVENOUS
  Filled 2018-10-27 (×4): qty 80

## 2018-10-27 MED ORDER — CITALOPRAM HYDROBROMIDE 40 MG PO TABS
40.0000 mg | ORAL_TABLET | Freq: Every day | ORAL | Status: DC
  Administered 2018-10-28: 40 mg via ORAL
  Filled 2018-10-27: qty 1

## 2018-10-27 MED ORDER — LEVOTHYROXINE SODIUM 75 MCG PO TABS
75.0000 ug | ORAL_TABLET | Freq: Every day | ORAL | Status: DC
  Administered 2018-10-28 – 2018-10-29 (×2): 75 ug via ORAL
  Filled 2018-10-27 (×2): qty 1

## 2018-10-27 MED ORDER — SODIUM CHLORIDE 0.9 % IV SOLN: 10.0000 mL/h | Freq: Once | INTRAVENOUS | Status: DC

## 2018-10-27 MED ORDER — SODIUM CHLORIDE 0.9% IV SOLUTION
Freq: Once | INTRAVENOUS | Status: AC
  Administered 2018-10-28: via INTRAVENOUS

## 2018-10-27 MED ORDER — MOMETASONE FURO-FORMOTEROL FUM 200-5 MCG/ACT IN AERO
2.0000 | INHALATION_SPRAY | Freq: Two times a day (BID) | RESPIRATORY_TRACT | Status: DC
  Administered 2018-10-28 – 2018-10-29 (×2): 2 via RESPIRATORY_TRACT
  Filled 2018-10-27: qty 8.8

## 2018-10-27 MED ORDER — GABAPENTIN 300 MG PO CAPS
600.0000 mg | ORAL_CAPSULE | Freq: Every day | ORAL | Status: DC
  Administered 2018-10-28: 600 mg via ORAL
  Filled 2018-10-27: qty 2

## 2018-10-27 MED ORDER — ONDANSETRON HCL 4 MG/2ML IJ SOLN
4.0000 mg | Freq: Once | INTRAMUSCULAR | Status: AC
  Administered 2018-10-27: 4 mg via INTRAVENOUS
  Filled 2018-10-27: qty 2

## 2018-10-27 MED ORDER — ALBUTEROL SULFATE (2.5 MG/3ML) 0.083% IN NEBU: 2.5000 mg | INHALATION_SOLUTION | Freq: Four times a day (QID) | RESPIRATORY_TRACT | Status: DC | PRN

## 2018-10-27 MED ORDER — SODIUM CHLORIDE 0.9% FLUSH: 3.0000 mL | Freq: Once | INTRAVENOUS | Status: DC

## 2018-10-27 MED ORDER — SODIUM CHLORIDE 0.9 % IV SOLN: INTRAVENOUS | Status: DC

## 2018-10-27 MED ORDER — ADULT MULTIVITAMIN W/MINERALS CH
1.0000 | ORAL_TABLET | Freq: Every day | ORAL | Status: DC
  Administered 2018-10-28 – 2018-10-29 (×2): 1 via ORAL
  Filled 2018-10-27 (×2): qty 1

## 2018-10-27 MED ORDER — SODIUM CHLORIDE 0.9 % IV SOLN
50.0000 ug/h | INTRAVENOUS | Status: DC
  Filled 2018-10-27: qty 1

## 2018-10-27 MED ORDER — ATORVASTATIN CALCIUM 10 MG PO TABS
10.0000 mg | ORAL_TABLET | Freq: Every day | ORAL | Status: DC
  Administered 2018-10-28: 10 mg via ORAL
  Filled 2018-10-27: qty 1

## 2018-10-27 MED ORDER — OCTREOTIDE LOAD VIA INFUSION
50.0000 ug | Freq: Once | INTRAVENOUS | Status: DC
  Filled 2018-10-27: qty 25

## 2018-10-27 MED ORDER — FLUTICASONE PROPIONATE 50 MCG/ACT NA SUSP: 2.0000 | Freq: Every day | NASAL | Status: DC | PRN

## 2018-10-27 MED ORDER — SODIUM CHLORIDE 0.9 % IV SOLN
INTRAVENOUS | Status: AC
  Administered 2018-10-28: 03:00:00 via INTRAVENOUS

## 2018-10-27 MED ORDER — ACETAMINOPHEN 325 MG PO TABS: 650.0000 mg | ORAL_TABLET | Freq: Four times a day (QID) | ORAL | Status: DC | PRN

## 2018-10-27 MED ORDER — THIAMINE HCL 100 MG/ML IJ SOLN
100.0000 mg | Freq: Once | INTRAMUSCULAR | Status: AC
  Administered 2018-10-27: 100 mg via INTRAVENOUS
  Filled 2018-10-27: qty 2

## 2018-10-27 MED ORDER — SODIUM CHLORIDE 0.9 % IV SOLN
80.0000 mg | Freq: Once | INTRAVENOUS | Status: AC
  Administered 2018-10-27: 80 mg via INTRAVENOUS
  Filled 2018-10-27: qty 80

## 2018-10-27 MED ORDER — ACETAMINOPHEN 650 MG RE SUPP: 650.0000 mg | Freq: Four times a day (QID) | RECTAL | Status: DC | PRN

## 2018-10-27 NOTE — ED Notes (Signed)
Attempted to call report to floor 

## 2018-10-27 NOTE — ED Triage Notes (Signed)
Pt. Stated, on Thursday I started having SOB and feeling a little whoozy, and my stool is black. All the symptoms seem to be getting worse.

## 2018-10-27 NOTE — ED Notes (Signed)
Report given to Terrilee Files RN

## 2018-10-27 NOTE — ED Provider Notes (Signed)
[facility] EMERGENCY DEPARTMENT Provider Note   CSN: [id number] Arrival date & time: 10/27/18  1252    History   Chief Complaint Chief Complaint  Patient presents with  . Shortness of Breath  . Dizziness  . Melena    HPI Heather Christensen is a 68 y.o. female who presents the emergency department chief complaint of shortness of breath and lightheadedness.  Patient states that her symptoms began 5 days ago.  Patient noticed that she was feeling very lightheaded.  She states that when she got home from work and walked from her car to the fence she was extremely winded and felt like she was going to pass out and had to sit down.  She says that her symptoms have progressively worsened and yesterday she developed several episodes of black stool.  She has never had anything like this before.  She has a distant history of alcohol abuse and is in recovery and sober for 30 years.  She denies any abdominal pain and has no history of gastric ulcers.  The patient is currently on aspirin and Xarelto for paroxysmal atrial fibrillation.     HPI  Past Medical History:  Diagnosis Date  . Alcohol abuse    Sober 20yrs   . Anemia    pernicious anemia  . Arthritis   . Asthma    seasonal, advair inhaler  . Complication of anesthesia    slow to wake  . Depression   . Dysrhythmia    palpitations, PVCs, SVT  . Eating disorder   . Heart murmur   . Hypertension   . Hypothyroidism   . Migraines   . Thyroid disease     Patient Active Problem List   Diagnosis Date Noted  . Stable angina ([place]) 08/21/2018  . Coronary artery disease involving native coronary artery 08/13/2018  . Calcific bursitis of shoulder 07/17/2018  . Paresthesias 06/17/2018  . HTN (hypertension) 06/17/2018  . Moderate persistent asthma with acute exacerbation 06/17/2018  . Dysthymia 06/17/2018  . Seasonal allergies 06/17/2018  . Former smoker 06/17/2018  . Pernicious anemia 06/17/2018  . Paroxysmal  atrial fibrillation ([place]) 05/11/2018  . Palpitations 05/11/2018  . Hyperlipidemia 05/11/2018  . Systolic murmur 09/73/5329  . Hypothyroid 05/11/2018  . Obesity (BMI 30-39.9) 05/11/2018  . S/p reverse total shoulder arthroplasty 11/09/2017    Past Surgical History:  Procedure Laterality Date  . BIOPSY  10/28/2018   Procedure: BIOPSY;  Surgeon: Mauri Pole, MD;  Location: [facility];  Service: Endoscopy;;  . BREAST SURGERY Right 2016   lumpectomy - begin  . CORONARY STENT INTERVENTION N/A 08/21/2018   Procedure: CORONARY STENT INTERVENTION;  Surgeon: Nigel Mormon, MD;  Location: [facility];  Service: Cardiovascular;  Laterality: N/A;  . ESOPHAGOGASTRODUODENOSCOPY (EGD) WITH PROPOFOL N/A 10/28/2018   Procedure: ESOPHAGOGASTRODUODENOSCOPY (EGD) WITH PROPOFOL;  Surgeon: Mauri Pole, MD;  Location: [facility];  Service: Endoscopy;  Laterality: N/A;  . EYE SURGERY     cataracts  . LEFT HEART CATH AND CORONARY ANGIOGRAPHY N/A 08/21/2018   Procedure: LEFT HEART CATH AND CORONARY ANGIOGRAPHY;  Surgeon: Nigel Mormon, MD;  Location: [facility];  Service: Cardiovascular;  Laterality: N/A;  . leg surgeries     fell leg got caught in a step stool and the toilet in the bathroom and crushed leg  . NECK SURGERY     lypoma removed from left side up behind ear (size of hand) beign  . REVERSE SHOULDER  ARTHROPLASTY Left 11/09/2017   Procedure: REVERSE LEFT SHOULDER ARTHROPLASTY;  Surgeon: Justice Britain, MD;  Location: [facility];  Service: Orthopedics;  Laterality: Left;  . SHOULDER SURGERY Left    rotator cuff, ball broke   . TONSILLECTOMY  1958     OB History   No obstetric history on file.      Home Medications    Prior to Admission medications   Medication Sig Start Date End Date Taking? Authorizing Provider  albuterol (PROVENTIL HFA;VENTOLIN HFA) 108 (90 Base) MCG/ACT inhaler Inhale 1-2 puffs into the lungs every 6 (six) hours as needed for wheezing  or shortness of breath. 06/07/18  Yes Bast, Traci A, NP  amLODipine (NORVASC) 10 MG tablet Take 10 mg by mouth daily after supper. 10/04/18  Yes [provider]  aspirin EC 81 MG tablet Take 81 mg by mouth daily.   Yes [provider]  atorvastatin (LIPITOR) 10 MG tablet TAKE 1 TABLET BY MOUTH EVERY DAY Patient taking differently: Take 10 mg by mouth daily.  06/19/18  Yes Adrian Prows, MD  citalopram (CELEXA) 40 MG tablet Take 1 tablet (40 mg total) by mouth at bedtime. 06/13/18  Yes [name], DO  clopidogrel (PLAVIX) 75 MG tablet Take 1 tablet (75 mg total) by mouth daily with breakfast. 08/22/18  Yes Patwardhan, Manish J, MD  cyanocobalamin (,VITAMIN B-12,) 1000 MCG/ML injection 1000 mcg (1 mg) injection once per month. Patient taking differently: Inject 1,000 mcg into the muscle every Wednesday.  06/13/18  Yes [name], DO  fluticasone (FLONASE) 50 MCG/ACT nasal spray Place 2 sprays into both nostrils daily. Patient taking differently: Place 2 sprays into both nostrils daily as needed (seasonal allergies).  06/13/18  Yes [name], DO  Fluticasone-Salmeterol (ADVAIR) 250-50 MCG/DOSE AEPB Inhale 1 puff into the lungs 2 (two) times daily as needed (shortness of breath/seasonal allergies).    Yes [provider]  gabapentin (NEURONTIN) 600 MG tablet Take 600 mg by mouth at bedtime.   Yes [provider]  isosorbide mononitrate (IMDUR) 30 MG 24 hr tablet TAKE 1 TABLET(30 MG) BY MOUTH DAILY Patient taking differently: Take 60 mg by mouth daily.  07/09/18  Yes Miquel Dunn, NP  levothyroxine (SYNTHROID, LEVOTHROID) 75 MCG tablet Take 1 tablet (75 mcg total) by mouth daily before breakfast. 06/13/18  Yes [name], DO  MELATONIN PO Take 1 tablet by mouth at bedtime as needed (sleep).   Yes [provider]  Menthol, Topical Analgesic, (BIOFREEZE EX) Apply 1 application topically at bedtime. For arm pain   Yes [provider]   metoprolol succinate (TOPROL-XL) 50 MG 24 hr tablet TAKE 1 TABLET BY MOUTH  DAILY IN THE EVENING Patient taking differently: Take 50 mg by mouth daily after supper.  08/07/18  Yes Miquel Dunn, NP  Multiple Vitamin (MULTIVITAMIN WITH MINERALS) TABS tablet Take 1 tablet by mouth daily.   Yes [provider]  nitroGLYCERIN (NITROSTAT) 0.4 MG SL tablet Place 1 tablet (0.4 mg total) under the tongue every 5 (five) minutes as needed for chest pain. 09/26/18 11/12/18 Yes Miquel Dunn, NP  spironolactone (ALDACTONE) 25 MG tablet Take 1 tablet (25 mg total) by mouth daily. 08/31/18  Yes Miquel Dunn, NP  TURMERIC PO Take 1 capsule by mouth daily.   Yes [provider]  Vitamin D, Ergocalciferol, (DRISDOL) 1.25 MG (50000 UT) CAPS capsule Take 1 capsule (50,000 Units total) by mouth every 7 (seven) days. Patient taking differently: Take 50,000 Units  by mouth every Wednesday.  07/17/18  Yes Lyndal Pulley, DO  apixaban (ELIQUIS) 5 MG TABS tablet Take 1 tablet (5 mg total) by mouth 2 (two) times daily. Restart on 8/22 11/03/18 12/03/18  [place] Donnamarie Poag, DO  pantoprazole (PROTONIX) 40 MG tablet Take 1 tablet (40 mg total) by mouth 2 (two) times daily. 10/29/18 01/27/19  [place] Donnamarie Poag, DO  sucralfate (CARAFATE) 1 GM/10ML suspension Take 10 mLs (1 g total) by mouth 4 (four) times daily -  with meals and at bedtime. 10/29/18 11/28/18  [place] Eric J, DO    Family History Family History  Problem Relation Age of Onset  . Arthritis Mother   . Depression Mother   . Heart disease Mother   . Hypertension Mother   . Stroke Mother   . Cervical cancer Mother   . Alcohol abuse Father   . Depression Father   . Drug abuse Father   . Heart disease Father   . Hypertension Father   . Lung cancer Father   . Parkinson's disease Sister   . COPD Sister   . Depression Sister   . Heart disease Sister   . Hypertension Sister   . Lung cancer Sister   . Heart disease Brother   .  Hypertension Brother   . Brain cancer Maternal Grandmother   . Depression Maternal Grandmother   . Heart disease Maternal Grandfather   . Alcohol abuse Paternal Grandmother   . Depression Paternal Grandmother     Social History Social History   Tobacco Use  . Smoking status: Former Smoker    Packs/day: 1.00    Years: 10.00    Pack years: 10.00    Types: Cigarettes    Quit date: 07/05/1988    Years since quitting: 30.3  . Smokeless tobacco: Never Used  Substance Use Topics  . Alcohol use: Never    Frequency: Never  . Drug use: Never    Comment: recovery for 30 years     Allergies   Sulfa antibiotics, Sulfamethoxazole, Quinolones, and Codeine   Review of Systems Review of Systems  Ten systems reviewed and are negative for acute change, except as noted in the HPI.   Physical Exam Updated Vital Signs BP (!) 132/52 (BP Location: Left Wrist)   Pulse 75   Temp 98.5 F (36.9 C) (Oral)   Resp 18   Ht 5\' 1"  (1.549 m)   Wt 101 kg   SpO2 97%   BMI 42.07 kg/m   Physical Exam Vitals signs and nursing note reviewed.  Constitutional:      General: She is not in acute distress.    Appearance: She is well-developed. She is not diaphoretic.  HENT:     Head: Normocephalic and atraumatic.  Eyes:     General: No scleral icterus.    Conjunctiva/sclera: Conjunctivae normal.  Neck:     Musculoskeletal: Normal range of motion.  Cardiovascular:     Rate and Rhythm: Normal rate and regular rhythm.     Heart sounds: Normal heart sounds. No murmur. No friction rub. No gallop.   Pulmonary:     Effort: Pulmonary effort is normal. No respiratory distress.     Breath sounds: Normal breath sounds.  Abdominal:     General: Bowel sounds are normal. There is no distension.     Palpations: Abdomen is soft. There is no mass.     Tenderness: There is no abdominal tenderness. There is no guarding.  Genitourinary:    Comments:  Digital Rectal Exam reveals sphincter with good tone. No  external hemorrhoids. No masses or fissures. Black stool on exam. Skin:    General: Skin is warm and dry.  Neurological:     Mental Status: She is alert and oriented to person, place, and time.  Psychiatric:        Behavior: Behavior normal.      ED Treatments / Results  Labs (all labs ordered are listed, but only abnormal results are displayed) Labs Reviewed  BASIC METABOLIC PANEL - Abnormal; Notable for the following components:      Result Value   Glucose, Bld 116 (*)    Calcium 8.7 (*)    All other components within normal limits  CBC - Abnormal; Notable for the following components:   RBC 2.10 (*)    Hemoglobin 6.8 (*)    HCT 20.3 (*)    nRBC 0.9 (*)    All other components within normal limits  URINALYSIS, ROUTINE W REFLEX MICROSCOPIC - Abnormal; Notable for the following components:   Nitrite POSITIVE (*)    Leukocytes,Ua SMALL (*)    Bacteria, UA RARE (*)    All other components within normal limits  PROTIME-INR - Abnormal; Notable for the following components:   Prothrombin Time 16.4 (*)    INR 1.3 (*)    All other components within normal limits  CBC - Abnormal; Notable for the following components:   RBC 3.20 (*)    Hemoglobin 9.8 (*)    HCT 29.4 (*)    RDW 16.5 (*)    nRBC 0.7 (*)    All other components within normal limits  CBC - Abnormal; Notable for the following components:   RBC 3.39 (*)    Hemoglobin 10.5 (*)    HCT 31.7 (*)    RDW 17.1 (*)    All other components within normal limits  CBG MONITORING, ED - Abnormal; Notable for the following components:   Glucose-Capillary 115 (*)    All other components within normal limits  POC OCCULT BLOOD, ED - Abnormal; Notable for the following components:   Fecal Occult Bld POSITIVE (*)    All other components within normal limits  SARS CORONAVIRUS 2 (HOSPITAL ORDER, [facility])  HIV ANTIBODY (ROUTINE TESTING W REFLEX)  TYPE AND SCREEN  PREPARE RBC (CROSSMATCH)  ABO/RH   PREPARE RBC (CROSSMATCH)  SURGICAL PATHOLOGY  TROPONIN I (HIGH SENSITIVITY)    EKG EKG Interpretation  Date/Time:  Saturday October 27 2018 12:58:25 EDT Ventricular Rate:  81 PR Interval:  132 QRS Duration: 68 QT Interval:  376 QTC Calculation: 436 R Axis:   29 Text Interpretation:  Normal sinus rhythm Nonspecific T wave abnormality Abnormal ECG No significant change since last tracing Confirmed by Deno Etienne [phone]) on 10/29/2018 8:30:53 AM   Radiology No results found.  Procedures .Critical Care Performed by: Margarita Mail, PA-C Authorized by: Margarita Mail, PA-C   Critical care provider statement:    Critical care time (minutes):  40   Critical care was necessary to treat or prevent imminent or life-threatening deterioration of the following conditions:  Circulatory failure (acute GI bleed)   Critical care was time spent personally by me on the following activities:  Discussions with consultants, evaluation of patient's response to treatment, examination of patient, ordering and performing treatments and interventions, ordering and review of laboratory studies, ordering and review of radiographic studies, pulse oximetry, re-evaluation of patient's condition, obtaining history from patient or surrogate and review  of old charts   (including critical care time)  Medications Ordered in ED Medications  0.9 %  sodium chloride infusion ( Intravenous Stopped 10/28/18 1127)  ondansetron (ZOFRAN) injection 4 mg (4 mg Intravenous Given 10/27/18 1752)  pantoprazole (PROTONIX) 80 mg in sodium chloride 0.9 % 100 mL IVPB (0 mg Intravenous Stopped 10/27/18 1645)  thiamine (B-1) injection 100 mg (100 mg Intravenous Given 10/27/18 1752)  0.9 %  sodium chloride infusion (Manually program via Guardrails IV Fluids) ( Intravenous Stopped 10/28/18 0321)     Initial Impression / Assessment and Plan / ED Course  I have reviewed the triage vital signs and the nursing notes.  Pertinent labs &  imaging results that were available during my care of the patient were reviewed by me and considered in my medical decision making (see chart for details).    Feelings of presyncope, shortness of breath, melena on exam, active GI bleed. The differential for syncope is extensive and includes, but is not limited to: arrythmia (Vtach, SVT, SSS, sinus arrest, AV block, bradycardia) aortic stenosis, AMI, HOCM, PE, atrial myxoma, pulmonary hypertension, orthostatic hypotension, (hypovolemia, drug effect, GB syndrome, micturition, cough, swall) carotid sinus sensitivity, Seizure, TIA/CVA, hypoglycemia,  Vertigo.  I personally reviewed the patient's labs which show hemoglobin of 6.8, drop of almost 7 g from 2 months ago, positive fecal occult stool test, elevated blood glucose, negative corona test, elevated PT/INR, normal BUN with slightly elevated glucose.  Patient was started here on IV Protonix.  I have low suspicion for esophageal varices given her distant history of alcohol abuse.  Patient given 2 units of packed red blood cells here in the emergency department.  I spoke with Dr. Bonnita Nasuti to come who will perform EGD sometime tomorrow morning given the patient's stable vital signs.  Patient will be admitted to the hospitalist service.  She is stable throughout her ED course.  EKG Interpretation  Date/Time:  Saturday October 27 2018 12:58:25 EDT Ventricular Rate:  81 PR Interval:  132 QRS Duration: 68 QT Interval:  376 QTC Calculation: 436 R Axis:   29 Text Interpretation:  Normal sinus rhythm Nonspecific T wave abnormality Abnormal ECG No significant change since last tracing Confirmed by Deno Etienne [phone]) on 10/29/2018 8:30:53 AM           Final Clinical Impressions(s) / ED Diagnoses   Final diagnoses:  Gastrointestinal hemorrhage with melena    ED Discharge Orders         Ordered    apixaban (ELIQUIS) 5 MG TABS tablet  2 times daily     10/29/18 0951    sucralfate (CARAFATE) 1 GM/10ML  suspension  3 times daily with meals & bedtime     10/29/18 0951    pantoprazole (PROTONIX) 40 MG tablet  2 times daily     10/29/18 0951    Increase activity slowly     10/29/18 0951    Diet - low sodium heart healthy     10/29/18 0951    Call MD for:  temperature >100.4     10/29/18 0951    Call MD for:  persistant nausea and vomiting     10/29/18 0951    Call MD for:  severe uncontrolled pain     10/29/18 0951    Call MD for:  difficulty breathing, headache or visual disturbances     10/29/18 0951    Call MD for:  persistant dizziness or light-headedness     10/29/18 0951  Call MD for:  extreme fatigue     10/29/18 [place], [place], PA-C 10/31/18 0947    Lacretia Leigh, MD 11/03/18 [phone]

## 2018-10-27 NOTE — H&P (Signed)
History and Physical    Heather Christensen:[id number] DOB: 01/15/1951 DOA: 10/27/2018  PCP: [name], DO Patient coming from: Home  Chief Complaint: Melena  HPI: Heather Christensen is a 68 y.o. female with medical history significant of paroxysmal atrial fibrillation on Xarelto, CAD status post PCI on aspirin and Plavix, anemia, arthritis, asthma, depression, dysrhythmia, hypertension, hypothyroidism presenting to the hospital for evaluation of melena.  Patient reports having fatigue, dyspnea on exertion, and chest pain with exertion for the past few weeks.  Chest pain is central and squeezing.  Both chest pain and dyspnea resolve with rest.  Last episode of chest pain was yesterday.  States her stools have been dark/black-colored for the past 2 days.  She takes ibuprofen a few times a week for chronic shoulder pain.  States she stopped drinking alcohol 30 years ago.  Denies history of prior GI bleed.  Denies abdominal pain or GERD symptoms.  ED Course: Hemodynamically stable.  Hemoglobin 6.8, was normal 2 months ago.  INR 1.3.  FOBT positive.  COVID-19 rapid test negative. Received PPI bolus and started on infusion.  1 units PRBCs. ED provider spoke to Dr. Silverio Decamp from GI, planning on doing EGD in the morning.  Review of Systems:  All systems reviewed and apart from history of presenting illness, are negative.  Past Medical History:  Diagnosis Date  . Alcohol abuse    Sober 56yrs   . Anemia    pernicious anemia  . Arthritis   . Asthma    seasonal, advair inhaler  . Complication of anesthesia    slow to wake  . Depression   . Dysrhythmia    palpitations, PVCs, SVT  . Eating disorder   . Heart murmur   . Hypertension   . Hypothyroidism   . Migraines   . Thyroid disease     Past Surgical History:  Procedure Laterality Date  . BREAST SURGERY Right 2016   lumpectomy - begin  . CORONARY STENT INTERVENTION N/A 08/21/2018   Procedure: CORONARY STENT INTERVENTION;  Surgeon:  Nigel Mormon, MD;  Location: [facility];  Service: Cardiovascular;  Laterality: N/A;  . EYE SURGERY     cataracts  . LEFT HEART CATH AND CORONARY ANGIOGRAPHY N/A 08/21/2018   Procedure: LEFT HEART CATH AND CORONARY ANGIOGRAPHY;  Surgeon: Nigel Mormon, MD;  Location: [facility];  Service: Cardiovascular;  Laterality: N/A;  . leg surgeries     fell leg got caught in a step stool and the toilet in the bathroom and crushed leg  . NECK SURGERY     lypoma removed from left side up behind ear (size of hand) beign  . REVERSE SHOULDER ARTHROPLASTY Left 11/09/2017   Procedure: REVERSE LEFT SHOULDER ARTHROPLASTY;  Surgeon: Justice Britain, MD;  Location: [facility];  Service: Orthopedics;  Laterality: Left;  . SHOULDER SURGERY Left    rotator cuff, ball broke   . TONSILLECTOMY  1958     reports that she quit smoking about 30 years ago. Her smoking use included cigarettes. She has a 10.00 pack-year smoking history. She has never used smokeless tobacco. She reports that she does not drink alcohol or use drugs.  Allergies  Allergen Reactions  . Sulfa Antibiotics Other (See Comments)    STEVENS JOHNSON  . Sulfamethoxazole Other (See Comments)    STEVENS JOHNSON  . Quinolones Swelling and Other (See Comments)    Facial swelling   . Codeine Nausea And Vomiting  Family History  Problem Relation Age of Onset  . Arthritis Mother   . Depression Mother   . Heart disease Mother   . Hypertension Mother   . Stroke Mother   . Cervical cancer Mother   . Alcohol abuse Father   . Depression Father   . Drug abuse Father   . Heart disease Father   . Hypertension Father   . Lung cancer Father   . Parkinson's disease Sister   . COPD Sister   . Depression Sister   . Heart disease Sister   . Hypertension Sister   . Lung cancer Sister   . Heart disease Brother   . Hypertension Brother   . Brain cancer Maternal Grandmother   . Depression Maternal Grandmother   . Heart disease  Maternal Grandfather   . Alcohol abuse Paternal Grandmother   . Depression Paternal Grandmother     Prior to Admission medications   Medication Sig Start Date End Date Taking? Authorizing Provider  albuterol (PROVENTIL HFA;VENTOLIN HFA) 108 (90 Base) MCG/ACT inhaler Inhale 1-2 puffs into the lungs every 6 (six) hours as needed for wheezing or shortness of breath. 06/07/18  Yes Bast, Traci A, NP  amLODipine (NORVASC) 10 MG tablet Take 10 mg by mouth daily after supper. 10/04/18  Yes [provider]  aspirin EC 81 MG tablet Take 81 mg by mouth daily.   Yes [provider]  atorvastatin (LIPITOR) 10 MG tablet TAKE 1 TABLET BY MOUTH EVERY DAY Patient taking differently: Take 10 mg by mouth daily.  06/19/18  Yes Adrian Prows, MD  citalopram (CELEXA) 40 MG tablet Take 1 tablet (40 mg total) by mouth at bedtime. 06/13/18  Yes [name], DO  clopidogrel (PLAVIX) 75 MG tablet Take 1 tablet (75 mg total) by mouth daily with breakfast. 08/22/18  Yes Patwardhan, Manish J, MD  cyanocobalamin (,VITAMIN B-12,) 1000 MCG/ML injection 1000 mcg (1 mg) injection once per month. Patient taking differently: Inject 1,000 mcg into the muscle every Wednesday.  06/13/18  Yes [name], DO  ELIQUIS 5 MG TABS tablet TAKE 1 TABLET BY MOUTH  TWICE A DAY Patient taking differently: Take 5 mg by mouth 2 (two) times daily.  08/07/18  Yes Miquel Dunn, NP  fluticasone [facility]) 50 MCG/ACT nasal spray Place 2 sprays into both nostrils daily. Patient taking differently: Place 2 sprays into both nostrils daily as needed (seasonal allergies).  06/13/18  Yes [name], DO  Fluticasone-Salmeterol (ADVAIR) 250-50 MCG/DOSE AEPB Inhale 1 puff into the lungs 2 (two) times daily as needed (shortness of breath/seasonal allergies).    Yes [provider]  gabapentin (NEURONTIN) 600 MG tablet Take 600 mg by mouth at bedtime.   Yes [provider]  isosorbide mononitrate (IMDUR) 30 MG 24 hr tablet  TAKE 1 TABLET(30 MG) BY MOUTH DAILY Patient taking differently: Take 60 mg by mouth daily.  07/09/18  Yes Miquel Dunn, NP  levothyroxine (SYNTHROID, LEVOTHROID) 75 MCG tablet Take 1 tablet (75 mcg total) by mouth daily before breakfast. 06/13/18  Yes [name], DO  MELATONIN PO Take 1 tablet by mouth at bedtime as needed (sleep).   Yes [provider]  Menthol, Topical Analgesic, (BIOFREEZE EX) Apply 1 application topically at bedtime. For arm pain   Yes [provider]  metoprolol succinate (TOPROL-XL) 50 MG 24 hr tablet TAKE 1 TABLET BY MOUTH  DAILY IN THE EVENING Patient taking differently: Take 50 mg by mouth daily after supper.  08/07/18  Yes Miquel Dunn, NP  Multiple Vitamin (MULTIVITAMIN WITH MINERALS) TABS tablet Take 1 tablet by mouth daily.   Yes [provider]  nitroGLYCERIN (NITROSTAT) 0.4 MG SL tablet Place 1 tablet (0.4 mg total) under the tongue every 5 (five) minutes as needed for chest pain. 09/26/18 11/12/18 Yes Miquel Dunn, NP  spironolactone (ALDACTONE) 25 MG tablet Take 1 tablet (25 mg total) by mouth daily. 08/31/18  Yes Miquel Dunn, NP  TURMERIC PO Take 1 capsule by mouth daily.   Yes [provider]  Vitamin D, Ergocalciferol, (DRISDOL) 1.25 MG (50000 UT) CAPS capsule Take 1 capsule (50,000 Units total) by mouth every 7 (seven) days. Patient taking differently: Take 50,000 Units by mouth every Wednesday.  07/17/18  Yes Hulan Saas M, DO  amLODipine (NORVASC) 5 MG tablet TAKE 1 TABLET(5 MG) BY MOUTH DAILY Patient not taking: Reported on 10/27/2018 08/07/18   Miquel Dunn, NP    Physical Exam: Vitals:   10/27/18 1630 10/27/18 1730 10/27/18 1835 10/27/18 1853  BP: (!) 144/60 (!) 122/55 (!) 122/50 (!) 145/63  Pulse: 66 70 65 69  Resp: 18 16 (!) 24 20  Temp:   98.5 F (36.9 C) 98.3 F (36.8 C)  TempSrc:   Oral Oral  SpO2: 98% 97% 97% 98%  Weight:      Height:        Physical Exam   Constitutional: She is oriented to person, place, and time. She appears well-developed and well-nourished. No distress.  HENT:  Head: Normocephalic.  Mouth/Throat: Oropharynx is clear and moist.  Eyes: Right eye exhibits no discharge. Left eye exhibits no discharge.  Neck: Neck supple.  Cardiovascular: Normal rate, regular rhythm and intact distal pulses.  Pulmonary/Chest: Effort normal and breath sounds normal. No respiratory distress. She has no wheezes. She has no rales.  Abdominal: Soft. Bowel sounds are normal. She exhibits no distension. There is no abdominal tenderness. There is no rebound and no guarding.  Musculoskeletal:        General: No edema.  Neurological: She is alert and oriented to person, place, and time.  Skin: Skin is warm and dry. She is not diaphoretic.     Labs on Admission: I have personally reviewed following labs and imaging studies  CBC: Recent Labs  Lab 10/27/18 1325  WBC 7.4  HGB 6.8*  HCT 20.3*  MCV 96.7  PLT 734   Basic Metabolic Panel: Recent Labs  Lab 10/27/18 1325  NA 139  K 4.3  CL 108  CO2 23  GLUCOSE 116*  BUN 18  CREATININE 0.86  CALCIUM 8.7*   GFR: Estimated Creatinine Clearance: 67.8 mL/min (by C-G formula based on SCr of 0.86 mg/dL). Liver Function Tests: No results for input(s): AST, ALT, ALKPHOS, BILITOT, PROT, ALBUMIN in the last 168 hours. No results for input(s): LIPASE, AMYLASE in the last 168 hours. No results for input(s): AMMONIA in the last 168 hours. Coagulation Profile: Recent Labs  Lab 10/27/18 1613  INR 1.3*   Cardiac Enzymes: No results for input(s): CKTOTAL, CKMB, CKMBINDEX, TROPONINI in the last 168 hours. BNP (last 3 results) No results for input(s): PROBNP in the last 8760 hours. HbA1C: No results for input(s): HGBA1C in the last 72 hours. CBG: Recent Labs  Lab 10/27/18 1425  GLUCAP 115*   Lipid Profile: No results for input(s): CHOL, HDL, LDLCALC, TRIG, CHOLHDL, LDLDIRECT in the last 72  hours. Thyroid Function Tests: No results for input(s): TSH, T4TOTAL, FREET4, T3FREE, THYROIDAB in  the last 72 hours. Anemia Panel: No results for input(s): VITAMINB12, FOLATE, FERRITIN, TIBC, IRON, RETICCTPCT in the last 72 hours. Urine analysis:    Component Value Date/Time   COLORURINE YELLOW 08/01/2018 1431   APPEARANCEUR CLEAR 08/01/2018 1431   LABSPEC <=1.005 (A) 08/01/2018 1431   PHURINE 7.0 08/01/2018 1431   GLUCOSEU NEGATIVE 08/01/2018 1431   [place] 08/01/2018 1431   BILIRUBINUR NEGATIVE 08/01/2018 1431   KETONESUR NEGATIVE 08/01/2018 1431   UROBILINOGEN 0.2 08/01/2018 1431   NITRITE NEGATIVE 08/01/2018 1431   LEUKOCYTESUR NEGATIVE 08/01/2018 1431    Radiological Exams on Admission: No results found.  EKG: Independently reviewed.  Sinus rhythm, nonspecific T wave abnormality.  Assessment/Plan Principal Problem:   Symptomatic anemia Active Problems:   Paroxysmal atrial fibrillation (HCC)   HTN (hypertension)   Coronary artery disease involving native coronary artery   Upper GI bleed   Symptomatic acute blood loss anemia, suspect secondary to upper GI bleed Differentials include gastritis, esophagitis, duodenitis, and peptic ulcer disease.  Currently on aspirin, Plavix, and Xarelto at home.  Hemodynamically stable.  Hemoglobin 6.8, was normal 2 months ago.  FOBT positive.  No episodes of melena in the hospital. -Type and screen -2 large-bore IVs -IV fluid hydration -PPI bolus and infusion -1 unit PRBCs ordered in the ED.  Will order an additional unit.  Keep hemoglobin >8.  -Hold home aspirin, Plavix, Xarelto -Posttransfusion H&H -GI has been consulted, planning on doing EGD in a.m.  Keep n.p.o. at midnight.  CAD status post PCI Patient reports recent anginal symptoms, likely related to acute blood loss anemia.  Currently chest pain-free and hemodynamically stable.  EKG not suggestive of ACS. -Cardiac monitoring -Check high-sensitivity troponin level  -Hold home aspirin and Plavix  Paroxysmal atrial fibrillation Currently in sinus rhythm. -Hold home Xarelto  Hypertension -Hold home antihypertensives in the setting of acute GI bleed  Hypothyroidism -Continue home Synthroid  Hyperlipidemia -Continue Lipitor  Depression -Continue Celexa  Asthma Stable.  No bronchospasm. -Continue home inhalers  Chronic pain -Continue home gabapentin -Tylenol PRN  DVT prophylaxis: SCDs Code Status: Full code.  Discussed with the patient. Family Communication: No family available. Disposition Plan: Anticipate discharge in 1 to 2 days. Consults called: GI (Dr. Silverio Decamp) Admission status: It is my clinical opinion that referral for OBSERVATION is reasonable and necessary in this patient based on the above information provided. The aforementioned taken together are felt to place the patient at high risk for further clinical deterioration. However it is anticipated that the patient may be medically stable for discharge from the hospital within 24 to 48 hours.  The medical decision making on this patient was of high complexity and the patient is at high risk for clinical deterioration, therefore this is a level 3 visit.  Shela Leff MD [facility] Pager [phone]  If 7PM-7AM, please contact night-coverage [email or web address] Password [facility]  10/27/2018, 9:02 PM

## 2018-10-27 NOTE — ED Notes (Signed)
ED TO INPATIENT HANDOFF REPORT  ED Nurse Name and Phone #: Tray Martinique, [phone]  S Name/Age/Gender Maryln Gottron 68 y.o. female Room/Bed: 039C/039C  Code Status   Code Status: Full Code  Home/SNF/Other Home Patient oriented to: self, place, time and situation Is this baseline? Yes   Triage Complete: Triage complete  Chief Complaint sob  Triage Note Pt. Stated, on Thursday I started having SOB and feeling a little whoozy, and my stool is black. All the symptoms seem to be getting worse.  Hgb. 6.8   Allergies Allergies  Allergen Reactions  . Sulfa Antibiotics Other (See Comments)    STEVENS JOHNSON  . Sulfamethoxazole Other (See Comments)    STEVENS JOHNSON  . Quinolones Swelling and Other (See Comments)    Facial swelling   . Codeine Nausea And Vomiting    Level of Care/Admitting Diagnosis ED Disposition    ED Disposition Condition Comment   Admit  Hospital Area: [facility] [[id number]]  Level of Care: Telemetry Medical [104]  I expect the patient will be discharged within 24 hours: Yes  LOW acuity---Tx typically complete <24 hrs---ACUTE conditions typically can be evaluated <24 hours---LABS likely to return to acceptable levels <24 hours---IS near functional baseline---EXPECTED to return to current living arrangement---NOT newly hypoxic: Meets criteria for 5C-Observation unit  Covid Evaluation: Confirmed COVID Negative  Diagnosis: Symptomatic anemia [[id number]]  Admitting Physician: Shela Leff [[id number]]  Attending Physician: Shela Leff [[id number]]  PT Class (Do Not Modify): Observation [104]  PT Acc Code (Do Not Modify): Observation [10022]       B Medical/Surgery History Past Medical History:  Diagnosis Date  . Alcohol abuse    Sober 54yrs   . Anemia    pernicious anemia  . Arthritis   . Asthma    seasonal, advair inhaler  . Complication of anesthesia    slow to wake  . Depression   . Dysrhythmia    palpitations,  PVCs, SVT  . Eating disorder   . Heart murmur   . Hypertension   . Hypothyroidism   . Migraines   . Thyroid disease    Past Surgical History:  Procedure Laterality Date  . BREAST SURGERY Right 2016   lumpectomy - begin  . CORONARY STENT INTERVENTION N/A 08/21/2018   Procedure: CORONARY STENT INTERVENTION;  Surgeon: Nigel Mormon, MD;  Location: [facility];  Service: Cardiovascular;  Laterality: N/A;  . EYE SURGERY     cataracts  . LEFT HEART CATH AND CORONARY ANGIOGRAPHY N/A 08/21/2018   Procedure: LEFT HEART CATH AND CORONARY ANGIOGRAPHY;  Surgeon: Nigel Mormon, MD;  Location: [facility];  Service: Cardiovascular;  Laterality: N/A;  . leg surgeries     fell leg got caught in a step stool and the toilet in the bathroom and crushed leg  . NECK SURGERY     lypoma removed from left side up behind ear (size of hand) beign  . REVERSE SHOULDER ARTHROPLASTY Left 11/09/2017   Procedure: REVERSE LEFT SHOULDER ARTHROPLASTY;  Surgeon: Justice Britain, MD;  Location: [facility];  Service: Orthopedics;  Laterality: Left;  . SHOULDER SURGERY Left    rotator cuff, ball broke   . TONSILLECTOMY  1958     A IV Location/Drains/Wounds Patient Lines/Drains/Airways Status   Active Line/Drains/Airways    Name:   Placement date:   Placement time:   Site:   Days:   Peripheral IV 10/27/18 Right;Anterior Forearm   10/27/18    1546  Forearm   less than 1   Peripheral IV 10/27/18 Left Antecubital   10/27/18    1938    Antecubital   less than 1   Incision (Closed) 11/09/17 Shoulder Left   11/09/17    0837     352          Intake/Output Last 24 hours No intake or output data in the 24 hours ending 10/27/18 2227  Labs/Imaging Results for orders placed or performed during the hospital encounter of 10/27/18 (from the past 48 hour(s))  Urinalysis, Routine w reflex microscopic     Status: Abnormal   Collection Time: 10/27/18  1:17 PM  Result Value Ref Range   Color, Urine YELLOW  YELLOW   APPearance CLEAR CLEAR   Specific Gravity, Urine 1.010 1.005 - 1.030   pH 5.0 5.0 - 8.0   Glucose, UA NEGATIVE NEGATIVE mg/dL   Hgb urine dipstick NEGATIVE NEGATIVE   Bilirubin Urine NEGATIVE NEGATIVE   Ketones, ur NEGATIVE NEGATIVE mg/dL   Protein, ur NEGATIVE NEGATIVE mg/dL   Nitrite POSITIVE (A) NEGATIVE   Leukocytes,Ua SMALL (A) NEGATIVE   RBC / HPF 0-5 0 - 5 RBC/hpf   WBC, UA 6-10 0 - 5 WBC/hpf   Bacteria, UA RARE (A) NONE SEEN   Squamous Epithelial / LPF 0-5 0 - 5    Comment: Performed at [facility], [address], [place], [place] [address]  Basic metabolic panel     Status: Abnormal   Collection Time: 10/27/18  1:25 PM  Result Value Ref Range   Sodium 139 135 - 145 mmol/L   Potassium 4.3 3.5 - 5.1 mmol/L   Chloride 108 98 - 111 mmol/L   CO2 23 22 - 32 mmol/L   Glucose, Bld 116 (H) 70 - 99 mg/dL   BUN 18 8 - 23 mg/dL   Creatinine, Ser 0.86 0.44 - 1.00 mg/dL   Calcium 8.7 (L) 8.9 - 10.3 mg/dL   GFR calc non Af Amer >60 >60 mL/min   GFR calc Af Amer >60 >60 mL/min   Anion gap 8 5 - 15    Comment: Performed at [facility], [place] [address] [place] [address]  CBC     Status: Abnormal   Collection Time: 10/27/18  1:25 PM  Result Value Ref Range   WBC 7.4 4.0 - 10.5 K/uL   RBC 2.10 (L) 3.87 - 5.11 MIL/uL   Hemoglobin 6.8 (LL) 12.0 - 15.0 g/dL    Comment: REPEATED TO VERIFY THIS CRITICAL RESULT HAS VERIFIED AND BEEN CALLED TO SCARLETT COOKE, RN BY KAY WOOLLEN ON 08 15 2020 AT 1359, AND HAS BEEN READ BACK.     HCT 20.3 (L) 36.0 - 46.0 %   MCV 96.7 80.0 - 100.0 fL   MCH 32.4 26.0 - 34.0 pg   MCHC 33.5 30.0 - 36.0 g/dL   RDW 13.8 11.5 - 15.5 %   Platelets 235 150 - 400 K/uL   nRBC 0.9 (H) 0.0 - 0.2 %    Comment: Performed at [facility] [address] [place], [place] [address]  CBG monitoring, ED     Status: Abnormal   Collection Time: 10/27/18  2:25 PM  Result Value Ref Range   Glucose-Capillary 115 (H) 70 - 99 mg/dL   Prepare RBC     Status: None   Collection Time: 10/27/18  2:45 PM  Result Value Ref Range   Order Confirmation  ORDER PROCESSED BY BLOOD BANK Performed at [facility], [place] [address], [place], [place] [address]   POC occult blood, ED Provider will collect     Status: Abnormal   Collection Time: 10/27/18  3:04 PM  Result Value Ref Range   Fecal Occult Bld POSITIVE (A) NEGATIVE  Protime-INR     Status: Abnormal   Collection Time: 10/27/18  4:13 PM  Result Value Ref Range   Prothrombin Time 16.4 (H) 11.4 - 15.2 seconds   INR 1.3 (H) 0.8 - 1.2    Comment: (NOTE) INR goal varies based on device and disease states. Performed at [facility], [place] [address] [place], [place] [address]   Type and screen [place]     Status: None (Preliminary result)   Collection Time: 10/27/18  4:13 PM  Result Value Ref Range   ABO/RH(D) A POS    Antibody Screen NEG    Sample Expiration 10/30/2018,2359    Unit Number [id number]    Blood Component Type RED CELLS,LR    Unit division 00    Status of Unit ALLOCATED    Transfusion Status OK TO TRANSFUSE    Crossmatch Result Compatible    Unit Number [id number]    Blood Component Type RED CELLS,LR    Unit division 00    Status of Unit ISSUED    Transfusion Status OK TO TRANSFUSE    Crossmatch Result      Compatible Performed at [facility], [place] [address], [place], [place] [address]   ABO/Rh     Status: None   Collection Time: 10/27/18  4:13 PM  Result Value Ref Range   ABO/RH(D)      A POS Performed at [facility] [address], [place], [place] [address]   SARS Coronavirus 2 [facility] order, Performed in [facility] hospital lab) Nasopharyngeal Nasopharyngeal Swab     Status: None   Collection Time: 10/27/18  4:18 PM   Specimen: Nasopharyngeal Swab  Result Value Ref Range   SARS Coronavirus 2 NEGATIVE NEGATIVE    Comment: (NOTE) If result is NEGATIVE SARS-CoV-2 target nucleic  acids are NOT DETECTED. The SARS-CoV-2 RNA is generally detectable in upper and lower  respiratory specimens during the acute phase of infection. The lowest  concentration of SARS-CoV-2 viral copies this assay can detect is 250  copies / mL. A negative result does not preclude SARS-CoV-2 infection  and should not be used as the sole basis for treatment or other  patient management decisions.  A negative result may occur with  improper specimen collection / handling, submission of specimen other  than nasopharyngeal swab, presence of viral mutation(s) within the  areas targeted by this assay, and inadequate number of viral copies  (<250 copies / mL). A negative result must be combined with clinical  observations, patient history, and epidemiological information. If result is POSITIVE SARS-CoV-2 target nucleic acids are DETECTED. The SARS-CoV-2 RNA is generally detectable in upper and lower  respiratory specimens dur ing the acute phase of infection.  Positive  results are indicative of active infection with SARS-CoV-2.  Clinical  correlation with patient history and other diagnostic information is  necessary to determine patient infection status.  Positive results do  not rule out bacterial infection or co-infection with other viruses. If result is PRESUMPTIVE POSTIVE SARS-CoV-2 nucleic acids MAY BE PRESENT.   A presumptive positive result was obtained on the submitted specimen  and confirmed on  repeat testing.  While 2019 novel coronavirus  (SARS-CoV-2) nucleic acids may be present in the submitted sample  additional confirmatory testing may be necessary for epidemiological  and / or clinical management purposes  to differentiate between  SARS-CoV-2 and other Sarbecovirus currently known to infect humans.  If clinically indicated additional testing with an alternate test  methodology [phone]) is advised. The SARS-CoV-2 RNA is generally  detectable in upper and lower respiratory  sp ecimens during the acute  phase of infection. The expected result is Negative. Fact Sheet for Patients:  StrictlyIdeas.no Fact Sheet for Healthcare Providers: BankingDealers.co.za This test is not yet approved or cleared by the [place] FDA and has been authorized for detection and/or diagnosis of SARS-CoV-2 by FDA under an Emergency Use Authorization (EUA).  This EUA will remain in effect (meaning this test can be used) for the duration of the COVID-19 declaration under Section 564(b)(1) of the Act, 21 U.S.C. section 360bbb-3(b)(1), unless the authorization is terminated or revoked sooner. Performed at [facility], [place] [address] [place], [place] [address]   Prepare RBC     Status: None   Collection Time: 10/27/18 10:00 PM  Result Value Ref Range   Order Confirmation      BB SAMPLE OR UNITS ALREADY AVAILABLE Performed at [facility], [address], [place], [place] [address]    No results found.  Pending Labs Unresulted Labs (From admission, onward)    Start     Ordered   10/28/18 0500  CBC  Tomorrow morning,   R    Question:  Specimen collection method  Answer:  IV Team=IV Team collect   10/27/18 2029   10/27/18 2102  Hemoglobin and hematocrit, blood  Once,   STAT    Comments: Post transfusion, after 2 unit PRBCs   Question:  Specimen collection method  Answer:  IV Team=IV Team collect   10/27/18 2102   10/27/18 2027  HIV antibody (Routine Testing)  Once,   STAT    Question:  Specimen collection method  Answer:  IV Team=IV Team collect   10/27/18 2029   Signed and Held  Novel Coronavirus, NAA (hospital order; send-out to ref lab)  Once,   R    Comments: No isolation needed for this testing (if isolation ordered for another indication, maintain current isolation).   Question Answer Comment  Is this test for diagnosis or screening Screening   Symptomatic for COVID-19 as defined by CDC No    Hospitalized for COVID-19 No   Admitted to ICU for COVID-19 No   Previously tested for COVID-19 Yes   Resident in a congregate (group) care setting No   Employed in healthcare setting No   Pregnant No   Pre-procedural testing Yes      Signed and Held          Vitals/Pain Today's Vitals   10/27/18 1630 10/27/18 1730 10/27/18 1835 10/27/18 1853  BP: (!) 144/60 (!) 122/55 (!) 122/50 (!) 145/63  Pulse: 66 70 65 69  Resp: 18 16 (!) 24 20  Temp:   98.5 F (36.9 C) 98.3 F (36.8 C)  TempSrc:   Oral Oral  SpO2: 98% 97% 97% 98%  Weight:      Height:      PainSc:        Isolation Precautions No active isolations  Medications Medications  sodium chloride flush (NS) 0.9 % injection 3 mL (3 mLs Intravenous Not Given 10/27/18 1747)  pantoprazole (PROTONIX)  80 mg in sodium chloride 0.9 % 250 mL (0.32 mg/mL) infusion (8 mg/hr Intravenous New Bag/Given 10/27/18 1951)  0.9 %  sodium chloride infusion (has no administration in time range)  atorvastatin (LIPITOR) tablet 10 mg (has no administration in time range)  citalopram (CELEXA) tablet 40 mg (has no administration in time range)  levothyroxine (SYNTHROID) tablet 75 mcg (has no administration in time range)  gabapentin (NEURONTIN) capsule 600 mg (has no administration in time range)  multivitamin with minerals tablet 1 tablet (has no administration in time range)  albuterol (PROVENTIL) (2.5 MG/3ML) 0.083% nebulizer solution 2.5 mg (has no administration in time range)  fluticasone (FLONASE) 50 MCG/ACT nasal spray 2 spray (has no administration in time range)  mometasone-formoterol (DULERA) 200-5 MCG/ACT inhaler 2 puff (has no administration in time range)  acetaminophen (TYLENOL) tablet 650 mg (has no administration in time range)    Or  acetaminophen (TYLENOL) suppository 650 mg (has no administration in time range)  0.9 %  sodium chloride infusion (has no administration in time range)  0.9 %  sodium chloride infusion (Manually  program via Guardrails IV Fluids) (has no administration in time range)  ondansetron (ZOFRAN) injection 4 mg (4 mg Intravenous Given 10/27/18 1752)  pantoprazole (PROTONIX) 80 mg in sodium chloride 0.9 % 100 mL IVPB (0 mg Intravenous Stopped 10/27/18 1645)  thiamine (B-1) injection 100 mg (100 mg Intravenous Given 10/27/18 1752)    Mobility walks Low fall risk   Focused Assessments Neuro Assessment Handoff:  Swallow screen pass? N/A         Neuro Assessment: Within Defined Limits Neuro Checks:      Last Documented NIHSS Modified Score:   Has TPA been given? No If patient is a Neuro Trauma and patient is going to OR before floor call report to [facility] nurse: [phone] or [phone]     R Recommendations: See Admitting Provider Note  Report given to:   Additional Notes:

## 2018-10-27 NOTE — ED Provider Notes (Signed)
Medical screening examination/treatment/procedure(s) were conducted as a shared visit with non-physician practitioner(s) and myself.  I personally evaluated the patient during the encounter.    68 year old female presents with dark stools.  Patient's hemoglobin is 6.8.  Will be admitted for work-up of GI bleed   Lacretia Leigh, MD 10/27/18 1539

## 2018-10-27 NOTE — ED Notes (Signed)
Charge nurse notified Hgb. 6.8

## 2018-10-27 NOTE — ED Triage Notes (Signed)
Hgb 6.8**

## 2018-10-28 ENCOUNTER — Other Ambulatory Visit: Payer: Self-pay

## 2018-10-28 ENCOUNTER — Encounter (HOSPITAL_COMMUNITY)
Admission: EM | Disposition: A | Discharge status: Home / Self Care | Payer: Self-pay | Source: Home / Self Care | Attending: Internal Medicine

## 2018-10-28 ENCOUNTER — Observation Stay (HOSPITAL_COMMUNITY): Payer: Medicare Other | Admitting: Certified Registered Nurse Anesthetist

## 2018-10-28 ENCOUNTER — Encounter (HOSPITAL_COMMUNITY): Payer: Self-pay | Admitting: General Practice

## 2018-10-28 DIAGNOSIS — F329 Major depressive disorder, single episode, unspecified: Secondary | ICD-10-CM | POA: Diagnosis present

## 2018-10-28 DIAGNOSIS — K921 Melena: Secondary | ICD-10-CM | POA: Diagnosis not present

## 2018-10-28 DIAGNOSIS — E785 Hyperlipidemia, unspecified: Secondary | ICD-10-CM | POA: Diagnosis present

## 2018-10-28 DIAGNOSIS — G43909 Migraine, unspecified, not intractable, without status migrainosus: Secondary | ICD-10-CM | POA: Diagnosis present

## 2018-10-28 DIAGNOSIS — E039 Hypothyroidism, unspecified: Secondary | ICD-10-CM | POA: Diagnosis present

## 2018-10-28 DIAGNOSIS — D62 Acute posthemorrhagic anemia: Secondary | ICD-10-CM | POA: Diagnosis not present

## 2018-10-28 DIAGNOSIS — Z7982 Long term (current) use of aspirin: Secondary | ICD-10-CM | POA: Diagnosis not present

## 2018-10-28 DIAGNOSIS — M1990 Unspecified osteoarthritis, unspecified site: Secondary | ICD-10-CM | POA: Diagnosis present

## 2018-10-28 DIAGNOSIS — D649 Anemia, unspecified: Secondary | ICD-10-CM | POA: Diagnosis not present

## 2018-10-28 DIAGNOSIS — J45909 Unspecified asthma, uncomplicated: Secondary | ICD-10-CM | POA: Diagnosis present

## 2018-10-28 DIAGNOSIS — I10 Essential (primary) hypertension: Secondary | ICD-10-CM | POA: Diagnosis not present

## 2018-10-28 DIAGNOSIS — Z6841 Body Mass Index (BMI) 40.0 and over, adult: Secondary | ICD-10-CM | POA: Diagnosis not present

## 2018-10-28 DIAGNOSIS — G8929 Other chronic pain: Secondary | ICD-10-CM | POA: Diagnosis present

## 2018-10-28 DIAGNOSIS — K254 Chronic or unspecified gastric ulcer with hemorrhage: Secondary | ICD-10-CM | POA: Diagnosis not present

## 2018-10-28 DIAGNOSIS — Z20828 Contact with and (suspected) exposure to other viral communicable diseases: Secondary | ICD-10-CM | POA: Diagnosis present

## 2018-10-28 DIAGNOSIS — I480 Paroxysmal atrial fibrillation: Secondary | ICD-10-CM

## 2018-10-28 DIAGNOSIS — Z7902 Long term (current) use of antithrombotics/antiplatelets: Secondary | ICD-10-CM | POA: Diagnosis not present

## 2018-10-28 DIAGNOSIS — Z9884 Bariatric surgery status: Secondary | ICD-10-CM | POA: Diagnosis not present

## 2018-10-28 DIAGNOSIS — K253 Acute gastric ulcer without hemorrhage or perforation: Secondary | ICD-10-CM | POA: Diagnosis not present

## 2018-10-28 DIAGNOSIS — F1011 Alcohol abuse, in remission: Secondary | ICD-10-CM | POA: Diagnosis present

## 2018-10-28 DIAGNOSIS — I2510 Atherosclerotic heart disease of native coronary artery without angina pectoris: Secondary | ICD-10-CM | POA: Diagnosis not present

## 2018-10-28 DIAGNOSIS — E079 Disorder of thyroid, unspecified: Secondary | ICD-10-CM | POA: Diagnosis present

## 2018-10-28 DIAGNOSIS — Z7901 Long term (current) use of anticoagulants: Secondary | ICD-10-CM | POA: Diagnosis not present

## 2018-10-28 DIAGNOSIS — K259 Gastric ulcer, unspecified as acute or chronic, without hemorrhage or perforation: Secondary | ICD-10-CM

## 2018-10-28 DIAGNOSIS — K922 Gastrointestinal hemorrhage, unspecified: Secondary | ICD-10-CM

## 2018-10-28 DIAGNOSIS — E669 Obesity, unspecified: Secondary | ICD-10-CM | POA: Diagnosis present

## 2018-10-28 DIAGNOSIS — Z8659 Personal history of other mental and behavioral disorders: Secondary | ICD-10-CM | POA: Diagnosis not present

## 2018-10-28 DIAGNOSIS — M25519 Pain in unspecified shoulder: Secondary | ICD-10-CM | POA: Diagnosis present

## 2018-10-28 DIAGNOSIS — Z955 Presence of coronary angioplasty implant and graft: Secondary | ICD-10-CM | POA: Diagnosis not present

## 2018-10-28 HISTORY — PX: ESOPHAGOGASTRODUODENOSCOPY (EGD) WITH PROPOFOL: SHX5813

## 2018-10-28 HISTORY — PX: BIOPSY: SHX5522

## 2018-10-28 LAB — HIV ANTIBODY (ROUTINE TESTING W REFLEX): HIV Screen 4th Generation wRfx: NONREACTIVE

## 2018-10-28 LAB — TROPONIN I (HIGH SENSITIVITY): Troponin I (High Sensitivity): 5 ng/L (ref <18)

## 2018-10-28 LAB — CBC
HCT: 29.4 % — ABNORMAL LOW (ref 36.0–46.0)
Hemoglobin: 9.8 g/dL — ABNORMAL LOW (ref 12.0–15.0)
MCH: 30.6 pg (ref 26.0–34.0)
MCHC: 33.3 g/dL (ref 30.0–36.0)
MCV: 91.9 fL (ref 80.0–100.0)
Platelets: 202 10*3/uL (ref 150–400)
RBC: 3.2 MIL/uL — ABNORMAL LOW (ref 3.87–5.11)
RDW: 16.5 % — ABNORMAL HIGH (ref 11.5–15.5)
WBC: 5.9 10*3/uL (ref 4.0–10.5)
nRBC: 0.7 % — ABNORMAL HIGH (ref 0.0–0.2)

## 2018-10-28 SURGERY — ESOPHAGOGASTRODUODENOSCOPY (EGD) WITH PROPOFOL
Anesthesia: Monitor Anesthesia Care

## 2018-10-28 MED ORDER — PROPOFOL 500 MG/50ML IV EMUL
INTRAVENOUS | Status: DC | PRN
  Administered 2018-10-28: 75 ug/kg/min via INTRAVENOUS

## 2018-10-28 MED ORDER — SUCRALFATE 1 GM/10ML PO SUSP
1.0000 g | Freq: Three times a day (TID) | ORAL | Status: DC
  Administered 2018-10-28 – 2018-10-29 (×2): 1 g via ORAL
  Filled 2018-10-28 (×2): qty 10

## 2018-10-28 MED ORDER — PROPOFOL 10 MG/ML IV BOLUS
INTRAVENOUS | Status: DC | PRN
  Administered 2018-10-28: 50 mg via INTRAVENOUS
  Administered 2018-10-28: 40 mg via INTRAVENOUS

## 2018-10-28 MED ORDER — LIDOCAINE HCL (CARDIAC) PF 100 MG/5ML IV SOSY
PREFILLED_SYRINGE | INTRAVENOUS | Status: DC | PRN
  Administered 2018-10-28: 40 mg via INTRAVENOUS

## 2018-10-28 MED ORDER — CLOPIDOGREL BISULFATE 75 MG PO TABS
75.0000 mg | ORAL_TABLET | Freq: Every day | ORAL | Status: DC
  Administered 2018-10-28 – 2018-10-29 (×2): 75 mg via ORAL
  Filled 2018-10-28 (×2): qty 1

## 2018-10-28 MED ORDER — ASPIRIN EC 81 MG PO TBEC
81.0000 mg | DELAYED_RELEASE_TABLET | Freq: Every day | ORAL | Status: DC
  Administered 2018-10-28 – 2018-10-29 (×2): 81 mg via ORAL
  Filled 2018-10-28 (×2): qty 1

## 2018-10-28 MED ORDER — ALUM & MAG HYDROXIDE-SIMETH 200-200-20 MG/5ML PO SUSP
30.0000 mL | Freq: Four times a day (QID) | ORAL | Status: DC | PRN
  Administered 2018-10-28: 30 mL via ORAL
  Filled 2018-10-28: qty 30

## 2018-10-28 SURGICAL SUPPLY — 14 items

## 2018-10-28 NOTE — Progress Notes (Addendum)
PROGRESS NOTE  Heather Christensen:[id number] DOB: 07-08-1950 DOA: 10/27/2018 PCP: [name], DO  HPI/Recap of past 81 hours: 68 year old female with past medical history of paroxysmal atrial fibrillation on Xarelto, CAD status post PCI on aspirin and Plavix as well as hypertension and hypothyroidism presented to the hospital on 8/15 after having fatigue and dyspnea on exertion for several weeks as well as dark stools for the past few days.  In the emergency room, found to have a hemoglobin of 6.8.  Received PPI bolus and started on infusion and transfused 1 unit packed red blood cells.  Patient made n.p.o.  This morning, hemoglobin up to 9.8.  Patient underwent upper endoscopy which noted several gastric ulcers that were nonbleeding.  Patient seen by GI who recommended continuing full liquids throughout the day monitoring hemoglobin and if hemoglobin remains stable, potential discharge tomorrow on PPI.  Assessment/Plan: Principal Problem: Upper GI bleed causing symptomatic anemia: Gastric ulcer without hemorrhage seen on EGD, likely source.  Patient may have been taking some NSAIDs over the past few weeks, advised to no longer do so.  She says she will not.  Continue IV PPI.  Continue full liquids and recheck hemoglobin in the morning.  As long as she does not have a large drop, likely okay to discharge home on PPI Active Problems:   Paroxysmal atrial fibrillation ([place]): Stable   HTN (hypertension)   Coronary artery disease involving native coronary artery: Given stability and no further bleeding.  She had a drug eluding stent placed, so we will restart Plavix now and Eliquis upon discharge Hypothyroidism: Continue Synthroid   Code Status: Full code  Family Communication: Left message for family  Disposition Plan: Discharge home tomorrow if no further bleeding   Consultants:  GI  Procedures:  Status post blood transfusion 8/15  Status post EGD done 8/16  Antimicrobials:   None  DVT prophylaxis: SCDs   Objective: Vitals:   10/28/18 0930 10/28/18 1006  BP: (!) 152/53 (!) 145/59  Pulse: 66 60  Resp: 16 18  Temp:  98.4 F (36.9 C)  SpO2: 98% 97%    Intake/Output Summary (Last 24 hours) at 10/28/2018 1519 Last data filed at 10/28/2018 1513 Gross per 24 hour  Intake 2530.06 ml  Output 1200 ml  Net 1330.06 ml   Filed Weights   10/27/18 1313 10/27/18 2326 10/28/18 0829  Weight: 97.5 kg 101.6 kg 101 kg   Body mass index is 42.07 kg/m.  Exam:   General: Alert and oriented x3, no acute distress  Cardiovascular: Irregular rhythm, rate controlled  Respiratory: Clear to auscultation bilaterally  Abdomen: Soft and nontender, nondistended, positive bowel sounds  Musculoskeletal: No clubbing or cyanosis or edema  Skin: No skin breaks, tears or lesions  Psychiatry: Appropriate, no evidence of psychoses   Data Reviewed: CBC: Recent Labs  Lab 10/27/18 1325 10/28/18 0610  WBC 7.4 5.9  HGB 6.8* 9.8*  HCT 20.3* 29.4*  MCV 96.7 91.9  PLT 235 811   Basic Metabolic Panel: Recent Labs  Lab 10/27/18 1325  NA 139  K 4.3  CL 108  CO2 23  GLUCOSE 116*  BUN 18  CREATININE 0.86  CALCIUM 8.7*   GFR: Estimated Creatinine Clearance: 69.2 mL/min (by C-G formula based on SCr of 0.86 mg/dL). Liver Function Tests: No results for input(s): AST, ALT, ALKPHOS, BILITOT, PROT, ALBUMIN in the last 168 hours. No results for input(s): LIPASE, AMYLASE in the last 168 hours. No results for input(s): AMMONIA in the  last 168 hours. Coagulation Profile: Recent Labs  Lab 10/27/18 1613  INR 1.3*   Cardiac Enzymes: No results for input(s): CKTOTAL, CKMB, CKMBINDEX, TROPONINI in the last 168 hours. BNP (last 3 results) No results for input(s): PROBNP in the last 8760 hours. HbA1C: No results for input(s): HGBA1C in the last 72 hours. CBG: Recent Labs  Lab 10/27/18 1425  GLUCAP 115*   Lipid Profile: No results for input(s): CHOL, HDL,  LDLCALC, TRIG, CHOLHDL, LDLDIRECT in the last 72 hours. Thyroid Function Tests: No results for input(s): TSH, T4TOTAL, FREET4, T3FREE, THYROIDAB in the last 72 hours. Anemia Panel: No results for input(s): VITAMINB12, FOLATE, FERRITIN, TIBC, IRON, RETICCTPCT in the last 72 hours. Urine analysis:    Component Value Date/Time   COLORURINE YELLOW 10/27/2018 [place] 10/27/2018 1317   LABSPEC 1.010 10/27/2018 1317   PHURINE 5.0 10/27/2018 1317   GLUCOSEU NEGATIVE 10/27/2018 [place] 08/01/2018 1431   HGBUR NEGATIVE 10/27/2018 1317   [place] 10/27/2018 1317   KETONESUR NEGATIVE 10/27/2018 1317   PROTEINUR NEGATIVE 10/27/2018 1317   UROBILINOGEN 0.2 08/01/2018 1431   NITRITE POSITIVE (A) 10/27/2018 1317   LEUKOCYTESUR SMALL (A) 10/27/2018 1317   Sepsis Labs: @LABRCNTIP (procalcitonin:4,lacticidven:4)  ) Recent Results (from the past 240 hour(s))  SARS Coronavirus 2 [facility] order, Performed in [facility] hospital lab) Nasopharyngeal Nasopharyngeal Swab     Status: None   Collection Time: 10/27/18  4:18 PM   Specimen: Nasopharyngeal Swab  Result Value Ref Range Status   SARS Coronavirus 2 NEGATIVE NEGATIVE Final    Comment: (NOTE) If result is NEGATIVE SARS-CoV-2 target nucleic acids are NOT DETECTED. The SARS-CoV-2 RNA is generally detectable in upper and lower  respiratory specimens during the acute phase of infection. The lowest  concentration of SARS-CoV-2 viral copies this assay can detect is 250  copies / mL. A negative result does not preclude SARS-CoV-2 infection  and should not be used as the sole basis for treatment or other  patient management decisions.  A negative result may occur with  improper specimen collection / handling, submission of specimen other  than nasopharyngeal swab, presence of viral mutation(s) within the  areas targeted by this assay, and inadequate number of viral copies  (<250 copies / mL). A negative  result must be combined with clinical  observations, patient history, and epidemiological information. If result is POSITIVE SARS-CoV-2 target nucleic acids are DETECTED. The SARS-CoV-2 RNA is generally detectable in upper and lower  respiratory specimens dur ing the acute phase of infection.  Positive  results are indicative of active infection with SARS-CoV-2.  Clinical  correlation with patient history and other diagnostic information is  necessary to determine patient infection status.  Positive results do  not rule out bacterial infection or co-infection with other viruses. If result is PRESUMPTIVE POSTIVE SARS-CoV-2 nucleic acids MAY BE PRESENT.   A presumptive positive result was obtained on the submitted specimen  and confirmed on repeat testing.  While 2019 novel coronavirus  (SARS-CoV-2) nucleic acids may be present in the submitted sample  additional confirmatory testing may be necessary for epidemiological  and / or clinical management purposes  to differentiate between  SARS-CoV-2 and other Sarbecovirus currently known to infect humans.  If clinically indicated additional testing with an alternate test  methodology [phone]) is advised. The SARS-CoV-2 RNA is generally  detectable in upper and lower respiratory sp ecimens during the acute  phase of infection. The expected result is Negative.  Fact Sheet for Patients:  StrictlyIdeas.no Fact Sheet for Healthcare Providers: BankingDealers.co.za This test is not yet approved or cleared by the [place] FDA and has been authorized for detection and/or diagnosis of SARS-CoV-2 by FDA under an Emergency Use Authorization (EUA).  This EUA will remain in effect (meaning this test can be used) for the duration of the COVID-19 declaration under Section 564(b)(1) of the Act, 21 U.S.C. section 360bbb-3(b)(1), unless the authorization is terminated or revoked sooner. Performed at [facility], [place] [address] [place], [place] [address]       Studies: No results found.  Scheduled Meds: . aspirin EC  81 mg Oral Daily  . atorvastatin  10 mg Oral Daily  . citalopram  40 mg Oral QHS  . clopidogrel  75 mg Oral Daily  . gabapentin  600 mg Oral QHS  . levothyroxine  75 mcg Oral QAC breakfast  . mometasone-formoterol  2 puff Inhalation BID  . multivitamin with minerals  1 tablet Oral Daily  . sodium chloride flush  3 mL Intravenous Once    Continuous Infusions: . sodium chloride 150 mL/hr (10/28/18 0845)  . pantoprozole (PROTONIX) infusion 8 mg/hr (10/28/18 1512)     LOS: 0 days     Annita Brod, MD [facility]  To reach me or the doctor on call, go to: [email or web address] Password [facility]  10/28/2018, 3:19 PM

## 2018-10-28 NOTE — Anesthesia Preprocedure Evaluation (Signed)
Anesthesia Evaluation  Patient identified by MRN, date of birth, ID band Patient awake    Reviewed: Allergy & Precautions, NPO status , Patient's Chart, lab work & pertinent test results  Airway Mallampati: I  TM Distance: >3 FB Neck ROM: Full    Dental   Pulmonary asthma , former smoker,    Pulmonary exam normal        Cardiovascular hypertension, Pt. on medications + Cardiac Stents  Normal cardiovascular exam     Neuro/Psych Depression    GI/Hepatic   Endo/Other    Renal/GU      Musculoskeletal   Abdominal   Peds  Hematology   Anesthesia Other Findings   Reproductive/Obstetrics                             Anesthesia Physical Anesthesia Plan  ASA: III  Anesthesia Plan: MAC   Post-op Pain Management:    Induction: Intravenous  PONV Risk Score and Plan: 2 and Treatment may vary due to age or medical condition  Airway Management Planned: Simple Face Mask  Additional Equipment:   Intra-op Plan:   Post-operative Plan:   Informed Consent: I have reviewed the patients History and Physical, chart, labs and discussed the procedure including the risks, benefits and alternatives for the proposed anesthesia with the patient or authorized representative who has indicated his/her understanding and acceptance.       Plan Discussed with: CRNA and Surgeon  Anesthesia Plan Comments:         Anesthesia Quick Evaluation

## 2018-10-28 NOTE — Progress Notes (Signed)
Patient complaining of sudden onset of chest pain 5/10 after eating her supper. VSS. MD notified. Patient states that pain has gone away after 10 minutes. MD placed orders for heartburn, Will administer medication per order and re-assess pain.

## 2018-10-28 NOTE — Anesthesia Postprocedure Evaluation (Signed)
Anesthesia Post Note  Patient: Heather Christensen  Procedure(s) Performed: ESOPHAGOGASTRODUODENOSCOPY (EGD) WITH PROPOFOL (N/A ) BIOPSY     Patient location during evaluation: PACU Anesthesia Type: MAC Level of consciousness: awake and alert Pain management: pain level controlled Vital Signs Assessment: post-procedure vital signs reviewed and stable Respiratory status: spontaneous breathing, nonlabored ventilation, respiratory function stable and patient connected to nasal cannula oxygen Cardiovascular status: stable and blood pressure returned to baseline Postop Assessment: no apparent nausea or vomiting Anesthetic complications: no    Last Vitals:  Vitals:   10/28/18 0930 10/28/18 1006  BP: (!) 152/53 (!) 145/59  Pulse: 66 60  Resp: 16 18  Temp:  36.9 C  SpO2: 98% 97%    Last Pain:  Vitals:   10/28/18 1130  TempSrc:   PainSc: 0-No pain                 Heather Christensen

## 2018-10-28 NOTE — Transfer of Care (Signed)
Immediate Anesthesia Transfer of Care Note  Patient: Heather Christensen  Procedure(s) Performed: ESOPHAGOGASTRODUODENOSCOPY (EGD) WITH PROPOFOL (N/A ) BIOPSY  Patient Location: Endoscopy Unit  Anesthesia Type:MAC  Level of Consciousness: drowsy  Airway & Oxygen Therapy: Patient Spontanous Breathing  Post-op Assessment: Report given to RN and Post -op Vital signs reviewed and stable spo2 98% room air  Post vital signs: Reviewed and stable  Last Vitals:  Vitals Value Taken Time  BP    Temp    Pulse 63 10/28/18 0915  Resp 15 10/28/18 0915  SpO2 98 % 10/28/18 0915  Vitals shown include unvalidated device data.  Last Pain:  Vitals:   10/28/18 0829  TempSrc: Temporal  PainSc: 0-No pain         Complications: No apparent anesthesia complications

## 2018-10-28 NOTE — Op Note (Signed)
[facility] Patient Name: Heather Christensen Procedure Date : 10/28/2018 MRN: [id number] Attending MD: Mauri Pole , MD Date of Birth: 08/22/1950 CSN: [id number] Age: 68 Admit Type: Inpatient Procedure:                Upper GI endoscopy Indications:              Active gastrointestinal bleeding, Suspected upper                            gastrointestinal bleeding with melena Providers:                Mauri Pole, MD, Glori Bickers, RN, Laverda Sorenson, Technician, Tawni Carnes, CRNA Referring MD:              Medicines:                Monitored Anesthesia Care Complications:            No immediate complications. Estimated Blood Loss:     Estimated blood loss was minimal. Procedure:                Pre-Anesthesia Assessment:                           - Prior to the procedure, a History and Physical                            was performed, and patient medications and                            allergies were reviewed. The patient's tolerance of                            previous anesthesia was also reviewed. The risks                            and benefits of the procedure and the sedation                            options and risks were discussed with the patient.                            All questions were answered, and informed consent                            was obtained. Prior Anticoagulants: The patient has                            taken no previous anticoagulant or antiplatelet                            agents. ASA Grade Assessment: III - A patient with  severe systemic disease. After reviewing the risks                            and benefits, the patient was deemed in                            satisfactory condition to undergo the procedure.                           After obtaining informed consent, the endoscope was                            passed under direct vision. Throughout the                            procedure, the patient's blood pressure, pulse, and                            oxygen saturations were monitored continuously. The                            GIF-H190 ([id number]) Olympus gastroscope was                            introduced through the mouth, and advanced to the                            second part of duodenum. The upper GI endoscopy was                            accomplished without difficulty. The patient                            tolerated the procedure well. Scope In: Scope Out: Findings:      No gross lesions were noted in the entire esophagus.      The Z-line was regular and was found 36 cm from the incisors.      Evidence of a gastric bypass was found in the gastric body. This was       characterized by a medium size gastric pouch with intact staple line,a       visible staple at gastrojejunostomy. ?disrupted staple with large       fistula vs ?modified gastric bypass, wide open passage into remnant       stomach and duodenum.      Many non-obstructing non-bleeding cratered gastric ulcers with a clean       ulcer base (Forrest Class III) were found at the incisura, in the       gastric antrum and in the prepyloric region of the stomach. The largest       lesion was 10 mm in largest dimension. Biopsies were taken with a cold       forceps for Helicobacter pylori testing.      The examined duodenum was normal.      The examined jejunum was normal. Impression:               - No  gross lesions in esophagus.                           - Z-line regular, 36 cm from the incisors.                           - A gastric bypass was found, characterized by an                            intact staple line.                           - Non-obstructing non-bleeding gastric ulcers with                            a clean ulcer base (Forrest Class III). Biopsied.                           - Normal examined duodenum.                           - Normal  examined jejunum. Recommendation:           - Patient has a contact number available for                            emergencies. The signs and symptoms of potential                            delayed complications were discussed with the                            patient. Return to normal activities tomorrow.                            Written discharge instructions were provided to the                            patient.                           - Full liquid diet today and advance as tolerated                            tomorrow.                           - Use Protonix (pantoprazole) 40 mg IV BID while                            inpatient, switch to PO protonix on discharge.                           - Use sucralfate tablets 1 gram PO QID for 1 month.                           -  No ibuprofen, naproxen, or other non-steroidal                            anti-inflammatory drugs.                           - Ok to continue low dose Aspirin and Plavix given                            h/o recent PCI with DES                           - Hold Eliquis for 5 days                           - Await pathology results, f/u H.pylori and trear                            if positive.                           - Follow up with Cardiology                           - Return to GI office at the next available                            appointment after discharge.                           - Ok to discharge home tomorrow if Hgb remains                            stable. Procedure Code(s):        --- Professional ---                           [phone], Esophagogastroduodenoscopy, flexible,                            transoral; with biopsy, single or multiple Diagnosis Code(s):        --- Professional ---                           Z98.84, Bariatric surgery status                           K25.9, Gastric ulcer, unspecified as acute or                            chronic, without hemorrhage or perforation                            K92.2, Gastrointestinal hemorrhage, unspecified CPT copyright 2019 American Medical [employer]. All rights reserved. The codes documented in this report are preliminary and upon coder review may  be revised to meet current compliance requirements. Karleen Hampshire  Bary Richard, MD 10/28/2018 9:32:59 AM This report has been signed electronically. Number of Addenda: 0

## 2018-10-28 NOTE — Consult Note (Signed)
Consultation  Referring Provider:    [facility] Primary Care Physician:  [name], DO Primary Gastroenterologist:        Althia Forts Reason for Consultation:     Melena            HPI:   Heather Christensen is a 68 y.o. female with history of proximal A. fib, CAD status post PCI with drug-eluting stent placement August 21, 2018 on aspirin, Plavix and Eliquis admitted with worsening dyspnea on exertion, chest pain and fatigue.  She also complaining of dark melenic stool for 2 days.  Denies taking any NSAIDs, takes Tylenol and tumor rec daily.  No history of alcohol. Status post gastric bypass surgery. Last colonoscopy about 10 years ago in [place], report not available during this hospitalization. No family history of GI malignancy or IBD.  Hemoglobin 6.8 on admission in ER from baseline 12.  INR 1.3 Hemodynamically stable Received 3 unit packed RBC with improvement of hemoglobin to 9.8.  Past Medical History:  Diagnosis Date  . Alcohol abuse    Sober 74yrs   . Anemia    pernicious anemia  . Arthritis   . Asthma    seasonal, advair inhaler  . Complication of anesthesia    slow to wake  . Depression   . Dysrhythmia    palpitations, PVCs, SVT  . Eating disorder   . Heart murmur   . Hypertension   . Hypothyroidism   . Migraines   . Thyroid disease     Past Surgical History:  Procedure Laterality Date  . BREAST SURGERY Right 2016   lumpectomy - begin  . CORONARY STENT INTERVENTION N/A 08/21/2018   Procedure: CORONARY STENT INTERVENTION;  Surgeon: Nigel Mormon, MD;  Location: [facility];  Service: Cardiovascular;  Laterality: N/A;  . EYE SURGERY     cataracts  . LEFT HEART CATH AND CORONARY ANGIOGRAPHY N/A 08/21/2018   Procedure: LEFT HEART CATH AND CORONARY ANGIOGRAPHY;  Surgeon: Nigel Mormon, MD;  Location: [facility];  Service: Cardiovascular;  Laterality: N/A;  . leg surgeries     fell leg got caught in a step stool and the toilet in the  bathroom and crushed leg  . NECK SURGERY     lypoma removed from left side up behind ear (size of hand) beign  . REVERSE SHOULDER ARTHROPLASTY Left 11/09/2017   Procedure: REVERSE LEFT SHOULDER ARTHROPLASTY;  Surgeon: Justice Britain, MD;  Location: [facility];  Service: Orthopedics;  Laterality: Left;  . SHOULDER SURGERY Left    rotator cuff, ball broke   . TONSILLECTOMY  1958    Family History  Problem Relation Age of Onset  . Arthritis Mother   . Depression Mother   . Heart disease Mother   . Hypertension Mother   . Stroke Mother   . Cervical cancer Mother   . Alcohol abuse Father   . Depression Father   . Drug abuse Father   . Heart disease Father   . Hypertension Father   . Lung cancer Father   . Parkinson's disease Sister   . COPD Sister   . Depression Sister   . Heart disease Sister   . Hypertension Sister   . Lung cancer Sister   . Heart disease Brother   . Hypertension Brother   . Brain cancer Maternal Grandmother   . Depression Maternal Grandmother   . Heart disease Maternal Grandfather   . Alcohol abuse Paternal Grandmother   .  Depression Paternal Grandmother      Social History   Tobacco Use  . Smoking status: Former Smoker    Packs/day: 1.00    Years: 10.00    Pack years: 10.00    Types: Cigarettes    Quit date: 07/05/1988    Years since quitting: 30.3  . Smokeless tobacco: Never Used  Substance Use Topics  . Alcohol use: Never    Frequency: Never  . Drug use: Never    Comment: recovery for 30 years    Prior to Admission medications   Medication Sig Start Date End Date Taking? Authorizing Provider  albuterol (PROVENTIL HFA;VENTOLIN HFA) 108 (90 Base) MCG/ACT inhaler Inhale 1-2 puffs into the lungs every 6 (six) hours as needed for wheezing or shortness of breath. 06/07/18  Yes Bast, Traci A, NP  amLODipine (NORVASC) 10 MG tablet Take 10 mg by mouth daily after supper. 10/04/18  Yes [provider]  aspirin EC 81 MG tablet Take 81 mg by mouth  daily.   Yes [provider]  atorvastatin (LIPITOR) 10 MG tablet TAKE 1 TABLET BY MOUTH EVERY DAY Patient taking differently: Take 10 mg by mouth daily.  06/19/18  Yes Adrian Prows, MD  citalopram (CELEXA) 40 MG tablet Take 1 tablet (40 mg total) by mouth at bedtime. 06/13/18  Yes [name], DO  clopidogrel (PLAVIX) 75 MG tablet Take 1 tablet (75 mg total) by mouth daily with breakfast. 08/22/18  Yes Patwardhan, Manish J, MD  cyanocobalamin (,VITAMIN B-12,) 1000 MCG/ML injection 1000 mcg (1 mg) injection once per month. Patient taking differently: Inject 1,000 mcg into the muscle every Wednesday.  06/13/18  Yes [name], DO  ELIQUIS 5 MG TABS tablet TAKE 1 TABLET BY MOUTH  TWICE A DAY Patient taking differently: Take 5 mg by mouth 2 (two) times daily.  08/07/18  Yes Miquel Dunn, NP  fluticasone [facility]) 50 MCG/ACT nasal spray Place 2 sprays into both nostrils daily. Patient taking differently: Place 2 sprays into both nostrils daily as needed (seasonal allergies).  06/13/18  Yes [name], DO  Fluticasone-Salmeterol (ADVAIR) 250-50 MCG/DOSE AEPB Inhale 1 puff into the lungs 2 (two) times daily as needed (shortness of breath/seasonal allergies).    Yes [provider]  gabapentin (NEURONTIN) 600 MG tablet Take 600 mg by mouth at bedtime.   Yes [provider]  isosorbide mononitrate (IMDUR) 30 MG 24 hr tablet TAKE 1 TABLET(30 MG) BY MOUTH DAILY Patient taking differently: Take 60 mg by mouth daily.  07/09/18  Yes Miquel Dunn, NP  levothyroxine (SYNTHROID, LEVOTHROID) 75 MCG tablet Take 1 tablet (75 mcg total) by mouth daily before breakfast. 06/13/18  Yes [name], DO  MELATONIN PO Take 1 tablet by mouth at bedtime as needed (sleep).   Yes [provider]  Menthol, Topical Analgesic, (BIOFREEZE EX) Apply 1 application topically at bedtime. For arm pain   Yes [provider]  metoprolol succinate (TOPROL-XL) 50 MG 24 hr tablet  TAKE 1 TABLET BY MOUTH  DAILY IN THE EVENING Patient taking differently: Take 50 mg by mouth daily after supper.  08/07/18  Yes Miquel Dunn, NP  Multiple Vitamin (MULTIVITAMIN WITH MINERALS) TABS tablet Take 1 tablet by mouth daily.   Yes [provider]  nitroGLYCERIN (NITROSTAT) 0.4 MG SL tablet Place 1 tablet (0.4 mg total) under the tongue every 5 (five) minutes as needed for chest pain. 09/26/18 11/12/18 Yes Miquel Dunn, NP  spironolactone (ALDACTONE) 25 MG tablet Take  1 tablet (25 mg total) by mouth daily. 08/31/18  Yes Miquel Dunn, NP  TURMERIC PO Take 1 capsule by mouth daily.   Yes [provider]  Vitamin D, Ergocalciferol, (DRISDOL) 1.25 MG (50000 UT) CAPS capsule Take 1 capsule (50,000 Units total) by mouth every 7 (seven) days. Patient taking differently: Take 50,000 Units by mouth every Wednesday.  07/17/18  Yes Hulan Saas M, DO  amLODipine (NORVASC) 5 MG tablet TAKE 1 TABLET(5 MG) BY MOUTH DAILY Patient not taking: Reported on 10/27/2018 08/07/18   Miquel Dunn, NP    Current Facility-Administered Medications  Medication Dose Route Frequency Provider Last Rate Last Dose  . 0.9 %  sodium chloride infusion  10 mL/hr Intravenous Once Shela Leff, MD      . 0.9 %  sodium chloride infusion   Intravenous Continuous Courtnay Petrilla, Venia Minks, MD      . acetaminophen (TYLENOL) tablet 650 mg  650 mg Oral Q6H PRN Shela Leff, MD       Or  . acetaminophen (TYLENOL) suppository 650 mg  650 mg Rectal Q6H PRN Shela Leff, MD      . albuterol (PROVENTIL) (2.5 MG/3ML) 0.083% nebulizer solution 2.5 mg  2.5 mg Inhalation Q6H PRN Shela Leff, MD      . atorvastatin (LIPITOR) tablet 10 mg  10 mg Oral Daily Shela Leff, MD      . citalopram (CELEXA) tablet 40 mg  40 mg Oral QHS Shela Leff, MD      . fluticasone (FLONASE) 50 MCG/ACT nasal spray 2 spray  2 spray Each Nare Daily PRN Shela Leff, MD      .  gabapentin (NEURONTIN) capsule 600 mg  600 mg Oral QHS Shela Leff, MD      . levothyroxine (SYNTHROID) tablet 75 mcg  75 mcg Oral QAC breakfast Shela Leff, MD      . mometasone-formoterol (DULERA) 200-5 MCG/ACT inhaler 2 puff  2 puff Inhalation BID Shela Leff, MD      . multivitamin with minerals tablet 1 tablet  1 tablet Oral Daily Shela Leff, MD      . pantoprazole (PROTONIX) 80 mg in sodium chloride 0.9 % 250 mL (0.32 mg/mL) infusion  8 mg/hr Intravenous Continuous Shela Leff, MD 25 mL/hr at 10/27/18 1951 8 mg/hr at 10/27/18 1950  . sodium chloride flush (NS) 0.9 % injection 3 mL  3 mL Intravenous Once Shela Leff, MD        Allergies as of 10/27/2018 - Review Complete 10/27/2018  Allergen Reaction Noted  . Sulfa antibiotics Other (See Comments) 02/27/2015  . Sulfamethoxazole Other (See Comments) 04/16/2012  . Quinolones Swelling and Other (See Comments) 04/16/2012  . Codeine Nausea And Vomiting 09/05/2017     Review of Systems:    This is positive for those things mentioned in the HPI, All other review of systems are negative.       Physical Exam:  Vital signs in last 24 hours: Temp:  [97.7 F (36.5 C)-98.7 F (37.1 C)] 98.2 F (36.8 C) (08/16 0829) Pulse Rate:  [64-75] 73 (08/16 0829) Resp:  [15-24] 19 (08/16 0829) BP: (122-167)/(48-74) 167/54 (08/16 0829) SpO2:  [96 %-99 %] 97 % (08/16 0829) Weight:  [97.5 kg-101.6 kg] 101 kg (08/16 0829) Last BM Date: 10/27/18  General:  Well-developed, well-nourished and in no acute distress Eyes:  anicteric. ENT:   Mouth and posterior pharynx free of lesions.  Lungs: Clear to auscultation bilaterally. Heart:  S1S2 Abdomen:  soft, non-tender, no distention  Extremities:   no edema Skin   no rash. Neuro:  A&O x 3.  Psych:  appropriate mood and  Affect.   Data Reviewed:   LAB RESULTS: Recent Labs    10/27/18 1325 10/28/18 0610  WBC 7.4 5.9  HGB 6.8* 9.8*  HCT  20.3* 29.4*  PLT 235 202   BMET Recent Labs    10/27/18 1325  NA 139  K 4.3  CL 108  CO2 23  GLUCOSE 116*  BUN 18  CREATININE 0.86  CALCIUM 8.7*   LFT No results for input(s): PROT, ALBUMIN, AST, ALT, ALKPHOS, BILITOT, BILIDIR, IBILI in the last 72 hours. PT/INR Recent Labs    10/27/18 1613  LABPROT 16.4*  INR 1.3*    STUDIES: No results found.   PREVIOUS ENDOSCOPIES:            As per HPI    Impression / Plan:   68 year old female status post gastric bypass surgery, obesity, paroxysmal A. fib, hypertension, CAD status post PCI with drug-eluting stent placement August 21, 2018 on aspirin Plavix and Eliquis admitted with anemia likely secondary to acute GI blood loss. History of melena concerning for acute upper GI bleed.  Currently hemodynamically stable.  Hemoglobin improved to 9.8 status post transfusion  We will proceed with EGD to evaluate.  To help diagnose if she has any high risk lesions.  Endoscopic therapeutic options may be limited given she is on dual antiplatelet and antithrombotic therapy. She will likely need to restart antiplatelet therapy soon as had a drug-eluting stent placement recently in the past 2 months Further recommendations based on EGD findings  Continue PPI gtt.  Monitor hemoglobin and transfuse as needed  The risks and benefits as well as alternatives of endoscopic procedure(s) have been discussed and reviewed. All questions answered. The patient agrees to proceed.       Damaris Hippo , MD [phone]

## 2018-10-29 ENCOUNTER — Encounter: Payer: Self-pay | Admitting: Gastroenterology

## 2018-10-29 ENCOUNTER — Telehealth: Payer: Self-pay

## 2018-10-29 LAB — CBC
HCT: 31.7 % — ABNORMAL LOW (ref 36.0–46.0)
Hemoglobin: 10.5 g/dL — ABNORMAL LOW (ref 12.0–15.0)
MCH: 31 pg (ref 26.0–34.0)
MCHC: 33.1 g/dL (ref 30.0–36.0)
MCV: 93.5 fL (ref 80.0–100.0)
Platelets: 229 10*3/uL (ref 150–400)
RBC: 3.39 MIL/uL — ABNORMAL LOW (ref 3.87–5.11)
RDW: 17.1 % — ABNORMAL HIGH (ref 11.5–15.5)
WBC: 4.8 10*3/uL (ref 4.0–10.5)
nRBC: 0 % (ref 0.0–0.2)

## 2018-10-29 LAB — BPAM RBC
Blood Product Expiration Date: 202009122359
Blood Product Expiration Date: 202009122359
ISSUE DATE / TIME: 202008151828
ISSUE DATE / TIME: 202008160015
Unit Type and Rh: 6200
Unit Type and Rh: 6200

## 2018-10-29 LAB — TYPE AND SCREEN
ABO/RH(D): A POS
Antibody Screen: NEGATIVE
Unit division: 0
Unit division: 0

## 2018-10-29 MED ORDER — PANTOPRAZOLE SODIUM 40 MG PO TBEC: 40.0000 mg | DELAYED_RELEASE_TABLET | Freq: Two times a day (BID) | ORAL | 0 refills | Status: DC

## 2018-10-29 MED ORDER — SUCRALFATE 1 GM/10ML PO SUSP: 1.0000 g | Freq: Three times a day (TID) | ORAL | 0 refills | Status: DC

## 2018-10-29 MED ORDER — PANTOPRAZOLE SODIUM 40 MG PO TBEC: 40.0000 mg | DELAYED_RELEASE_TABLET | Freq: Two times a day (BID) | ORAL | Status: DC

## 2018-10-29 MED ORDER — APIXABAN 5 MG PO TABS: 5.0000 mg | ORAL_TABLET | Freq: Two times a day (BID) | ORAL | Status: DC

## 2018-10-29 NOTE — Telephone Encounter (Signed)
LM for patient to return call for TCM. 

## 2018-10-29 NOTE — Progress Notes (Addendum)
Daily Rounding Note  10/29/2018, 9:43 AM  LOS: 1 day   SUBJECTIVE:   Chief complaint: Blood loss anemia.  Gastric ulcers. Weakness, shortness of breath resolved.  Had some GI distress/upset with full liquids yesterday but tolerating regular diet much better without incident today.  No bowel movements since before she arrived.  OBJECTIVE:         Vital signs in last 24 hours:    Temp:  [97.7 F (36.5 C)-98.5 F (36.9 C)] 98.5 F (36.9 C) (08/17 0850) Pulse Rate:  [60-85] 75 (08/17 0850) Resp:  [16-18] 18 (08/17 0850) BP: (130-148)/(49-64) 132/52 (08/17 0850) SpO2:  [94 %-98 %] 97 % (08/17 0850) Last BM Date: 10/27/18 Filed Weights   10/27/18 1313 10/27/18 2326 10/28/18 0829  Weight: 97.5 kg 101.6 kg 101 kg   General: Looks well.  NAD. Heart: RRR. Chest: Clear bilaterally.  No labored breathing or cough. Abdomen: Soft.  Not tender.  Not distended.  Active bowel sounds. Extremities: No CCE. Neuro/Psych: Alert.  Oriented x3.  No limb weakness or tremors.  Calm.  Intake/Output from previous day: 08/16 0701 - 08/17 0700 In: 2739.6 [P.O.:1080; I.V.:1659.6] Out: 500 [Urine:500]  Intake/Output this shift: No intake/output data recorded.  Lab Results: Recent Labs    10/27/18 1325 10/28/18 0610 10/29/18 0843  WBC 7.4 5.9 4.8  HGB 6.8* 9.8* 10.5*  HCT 20.3* 29.4* 31.7*  PLT 235 202 229   BMET Recent Labs    10/27/18 1325  NA 139  K 4.3  CL 108  CO2 23  GLUCOSE 116*  BUN 18  CREATININE 0.86  CALCIUM 8.7*   LFT No results for input(s): PROT, ALBUMIN, AST, ALT, ALKPHOS, BILITOT, BILIDIR, IBILI in the last 72 hours. PT/INR Recent Labs    10/27/18 1613  LABPROT 16.4*  INR 1.3*   Hepatitis Panel No results for input(s): HEPBSAG, HCVAB, HEPAIGM, HEPBIGM in the last 72 hours.  Studies/Results: No results found.  Scheduled Meds: . aspirin EC  81 mg Oral Daily  . atorvastatin  10 mg Oral Daily  .  citalopram  40 mg Oral QHS  . clopidogrel  75 mg Oral Daily  . gabapentin  600 mg Oral QHS  . levothyroxine  75 mcg Oral QAC breakfast  . mometasone-formoterol  2 puff Inhalation BID  . multivitamin with minerals  1 tablet Oral Daily  . sodium chloride flush  3 mL Intravenous Once  . sucralfate  1 g Oral TID WC & HS   Continuous Infusions: . sodium chloride 150 mL/hr (10/28/18 0845)  . pantoprozole (PROTONIX) infusion 8 mg/hr (10/28/18 1512)   PRN Meds:.acetaminophen **OR** acetaminophen, albuterol, alum & mag hydroxide-simeth, fluticasone  ASSESMENT:   *   GIB, melena.  Hgb 6.8  >> 3 PRBCs >> 10.5.  10/28/2018 EGD: Previous gastric bypass with intact staple line.  Nonobstructing, nonbleeding clean-based, gastric ulcers, biopsies obtained.  Normal duodenum and jejunum. No PPI etc at home.  Not using NSAIDs just the low-dose aspirin at home.  Currently on Protonix drip, 4 times daily Carafate (4 weeks).  *   A. fib, CAD.  DES placed 08/21/2018.  Aspirin, Plavix, Eliquis since then. INR 1.3  PLAN   *    Switch to oral Protonix twice daily once current Protonix drip finishes. Ok to discharge home today Protonix 40 twice daily and Carafate 1 g 4 times daily.  Continue Protonix until seen back by Dr. Silverio Decamp.  Carafate finishes at 1  month. ROV Dr Silverio Decamp 9/24 at 9:30 AM. Showed have her CBC obtained within the next 7 to 10 days.    *   Await pndg gastric bx path, if H pylori is + will need abx added.      Azucena Freed  10/29/2018, 9:43 AM Phone [phone]  GI ATTENDING  Interval history and data reviewed.  Patient's clinical case and endoscopy findings reviewed with Dr. Silverio Decamp.  The patient is stable for discharge on the above GI medical therapies with follow-up as arranged.  Docia Chuck. Geri Seminole., M.D. [facility] Division of Gastroenterology

## 2018-10-29 NOTE — Discharge Instructions (Signed)
Peptic Ulcer ° °A peptic ulcer is a painful sore in the lining of your stomach or the first part of your small intestine. °What are the causes? °Common causes of this condition include: °· An infection. °· Using certain pain medicines too often or too much. °What increases the risk? °You are more likely to get this condition if you: °· Smoke. °· Have a family history of ulcer disease. °· Drink alcohol. °· Have been hospitalized in an intensive care unit (ICU). °What are the signs or symptoms? °Symptoms include: °· Burning pain in the area between the chest and the belly button. The pain may: °? Not go away (be persistent). °? Be worse when your stomach is empty. °? Be worse at night. °· Heartburn. °· Feeling sick to your stomach (nauseous) and throwing up (vomiting). °· Bloating. °If the ulcer results in bleeding, it can cause you to: °· Have poop (stool) that is black and looks like tar. °· Throw up bright red blood. °· Throw up material that looks like coffee grounds. °How is this treated? °Treatment for this condition may include: °· Stopping things that can cause the ulcer, such as: °? Smoking. °? Using pain medicines. °· Medicines to reduce stomach acid. °· Antibiotic medicines if the ulcer is caused by an infection. °· A procedure that is done using a small, flexible tube that has a camera at the end (upper endoscopy). This may be done if you have a bleeding ulcer. °· Surgery. This may be needed if: °? You have a lot of bleeding. °? The ulcer caused a hole somewhere in the digestive system. °Follow these instructions at home: °· Do not drink alcohol if your doctor tells you not to drink. °· Limit how much caffeine you take in. °· Do not use any products that contain nicotine or tobacco, such as cigarettes, e-cigarettes, and chewing tobacco. If you need help quitting, ask your doctor. °· Take over-the-counter and prescription medicines only as told by your doctor. °? Do not stop or change your medicines unless  you talk with your doctor about it first. °? Do not take aspirin, ibuprofen, or other NSAIDs unless your doctor told you to do so. °· Keep all follow-up visits as told by your doctor. This is important. °Contact a doctor if: °· You do not get better in 7 days after you start treatment. °· You keep having an upset stomach (indigestion) or heartburn. °Get help right away if: °· You have sudden, sharp pain in your belly (abdomen). °· You have belly pain that does not go away. °· You have bloody poop (stool) or black, tarry poop. °· You throw up blood. It may look like coffee grounds. °· You feel light-headed or feel like you may pass out (faint). °· You get weak. °· You get sweaty or feel sticky and cold to the touch (clammy). °Summary °· Symptoms of a peptic ulcer include burning pain in the area between the chest and the belly button. °· Take medicines only as told by your doctor. °· Limit how much alcohol and caffeine you have. °· Keep all follow-up visits as told by your doctor. °This information is not intended to replace advice given to you by your health care provider. Make sure you discuss any questions you have with your health care provider. °Document Released: 05/25/2009 Document Revised: 09/05/2017 Document Reviewed: 09/05/2017 °[employer] Patient Education © 2020 [employer] ° °

## 2018-10-29 NOTE — Discharge Summary (Signed)
Physician Discharge Summary  Heather Christensen JTT:[id number] DOB: 03-May-1950 DOA: 10/27/2018  PCP: [name], DO  Admit date: 10/27/2018 Discharge date: 10/29/2018  Admitted From: Home Disposition: Home  Recommendations for Outpatient Follow-up:  1. Follow up with PCP in 1 week 2. Follow-up with gastroenterology scheduled on 12/06/2018 at 09:30 3. Please obtain BMP/CBC in one week 4. Restart Eliquis on 11/03/2018 per gastroenterology 5. Started on Protonix 40 mg p.o. twice daily 6. Started on sulcralfate 1g PO QID for one month 7. Please follow up on the following pending results: H. pylori biopsy; if positive will need antibiotics started  Home Health: No Equipment/Devices: None  Discharge Condition: Stable CODE STATUS: Full code Diet recommendation: Regular diet  History of present illness:  Heather Christensen is a 68 y.o. female with medical history significant of paroxysmal atrial fibrillation on Xarelto, CAD status post PCI on aspirin and Plavix, anemia, arthritis, asthma, depression, dysrhythmia, hypertension, hypothyroidism presenting to the hospital for evaluation of melena.  Patient reports having fatigue, dyspnea on exertion, and chest pain with exertion for the past few weeks.  Chest pain is central and squeezing.  Both chest pain and dyspnea resolve with rest.  Last episode of chest pain was yesterday.  States her stools have been dark/black-colored for the past 2 days.  She takes ibuprofen a few times a week for chronic shoulder pain.  States she stopped drinking alcohol 30 years ago.  Denies history of prior GI bleed.  Denies abdominal pain or GERD symptoms.  ED Course: Hemodynamically stable.  Hemoglobin 6.8, was normal 2 months ago.  INR 1.3.  FOBT positive.  COVID-19 rapid test negative. Received PPI bolus and started on infusion.  1 units PRBCs. ED provider spoke to Dr. Silverio Decamp from GI, planning on EGD.  Hospital course:  Acute blood loss anemia Upper GI bleed  secondary to gastric ulcer Patient presenting with fatigue, shortness of breath and chest pain with exertion that is been progressing over the past few weeks.  Currently on aspirin, Plavix and Eliquis.  Reports dark/tarry stools past 2 days.  Denies NSAID/EtOH abuse.  Hemoglobin on admission 6.8, FOBT positive.  Transfused 2 units PRBCs and started on a PPI drip..  Gastroenterology was consulted and patient underwent EGD 10/28/2018 with findings of a nonobstructing nonbleeding gastric ulcer, biopsies were taken.  Hemoglobin remained stable and was 10.5 at time of discharge.  Patient has resumed her low-dose aspirin and Plavix.  She will continue to hold her Eliquis for 5 days with plan to restart on 11/03/2018.  Patient will continue Protonix 40 mg p.o. twice daily and sulcralfate 1g QID.  Follow-up with gastroenterology as scheduled.  Will need follow-up of H. pylori testing that was pending at time of discharge.  Discharge Diagnoses:  Active Problems:   Paroxysmal atrial fibrillation (HCC)   HTN (hypertension)   Coronary artery disease involving native coronary artery    Discharge Instructions  Discharge Instructions    Call MD for:  difficulty breathing, headache or visual disturbances   Complete by: As directed    Call MD for:  extreme fatigue   Complete by: As directed    Call MD for:  persistant dizziness or light-headedness   Complete by: As directed    Call MD for:  persistant nausea and vomiting   Complete by: As directed    Call MD for:  severe uncontrolled pain   Complete by: As directed    Call MD for:  temperature >100.4   Complete by:  As directed    Diet - low sodium heart healthy   Complete by: As directed    Increase activity slowly   Complete by: As directed      Allergies as of 10/29/2018      Reactions   Sulfa Antibiotics Other (See Comments)   STEVENS JOHNSON   Sulfamethoxazole Other (See Comments)   STEVENS JOHNSON   Quinolones Swelling, Other (See Comments)    Facial swelling   Codeine Nausea And Vomiting      Medication List    TAKE these medications   albuterol 108 (90 Base) MCG/ACT inhaler Commonly known as: VENTOLIN HFA Inhale 1-2 puffs into the lungs every 6 (six) hours as needed for wheezing or shortness of breath.   amLODipine 10 MG tablet Commonly known as: NORVASC Take 10 mg by mouth daily after supper. What changed: Another medication with the same name was removed. Continue taking this medication, and follow the directions you see here.   apixaban 5 MG Tabs tablet Commonly known as: Eliquis Take 1 tablet (5 mg total) by mouth 2 (two) times daily. Restart on 8/22 Start taking on: November 03, 2018 What changed:   how much to take  additional instructions  These instructions start on November 03, 2018. If you are unsure what to do until then, ask your doctor or other care provider.   aspirin EC 81 MG tablet Take 81 mg by mouth daily.   atorvastatin 10 MG tablet Commonly known as: LIPITOR TAKE 1 TABLET BY MOUTH EVERY DAY   BIOFREEZE EX Apply 1 application topically at bedtime. For arm pain   citalopram 40 MG tablet Commonly known as: CELEXA Take 1 tablet (40 mg total) by mouth at bedtime.   clopidogrel 75 MG tablet Commonly known as: PLAVIX Take 1 tablet (75 mg total) by mouth daily with breakfast.   cyanocobalamin 1000 MCG/ML injection Commonly known as: (VITAMIN B-12) 1000 mcg (1 mg) injection once per month. What changed:   how much to take  how to take this  when to take this  additional instructions   fluticasone 50 MCG/ACT nasal spray Commonly known as: FLONASE Place 2 sprays into both nostrils daily. What changed:   when to take this  reasons to take this   Fluticasone-Salmeterol 250-50 MCG/DOSE Aepb Commonly known as: ADVAIR Inhale 1 puff into the lungs 2 (two) times daily as needed (shortness of breath/seasonal allergies).   gabapentin 600 MG tablet Commonly known as: NEURONTIN Take 600  mg by mouth at bedtime.   isosorbide mononitrate 30 MG 24 hr tablet Commonly known as: IMDUR TAKE 1 TABLET(30 MG) BY MOUTH DAILY What changed: See the new instructions.   levothyroxine 75 MCG tablet Commonly known as: SYNTHROID Take 1 tablet (75 mcg total) by mouth daily before breakfast.   MELATONIN PO Take 1 tablet by mouth at bedtime as needed (sleep).   metoprolol succinate 50 MG 24 hr tablet Commonly known as: TOPROL-XL TAKE 1 TABLET BY MOUTH  DAILY IN THE EVENING What changed: when to take this   multivitamin with minerals Tabs tablet Take 1 tablet by mouth daily.   nitroGLYCERIN 0.4 MG SL tablet Commonly known as: NITROSTAT Place 1 tablet (0.4 mg total) under the tongue every 5 (five) minutes as needed for chest pain.   pantoprazole 40 MG tablet Commonly known as: Protonix Take 1 tablet (40 mg total) by mouth 2 (two) times daily.   spironolactone 25 MG tablet Commonly known as: ALDACTONE Take 1 tablet (25  mg total) by mouth daily.   sucralfate 1 GM/10ML suspension Commonly known as: CARAFATE Take 10 mLs (1 g total) by mouth 4 (four) times daily -  with meals and at bedtime.   TURMERIC PO Take 1 capsule by mouth daily.   Vitamin D (Ergocalciferol) 1.25 MG (50000 UT) Caps capsule Commonly known as: DRISDOL Take 1 capsule (50,000 Units total) by mouth every 7 (seven) days. What changed: when to take this      Follow-up Information    [name], DO. Schedule an appointment as soon as possible for a visit in 1 week(s).   Specialty: Family Medicine Contact information: [place] [address] [phone]          Allergies  Allergen Reactions  . Sulfa Antibiotics Other (See Comments)    STEVENS JOHNSON  . Sulfamethoxazole Other (See Comments)    STEVENS JOHNSON  . Quinolones Swelling and Other (See Comments)    Facial swelling   . Codeine Nausea And Vomiting    Consultations:  Gastroenterology - Dr.  Silverio Decamp   Procedures/Studies:  No results found.  EGD 27-Nov-2018: Impression: - No gross lesions in esophagus. - Z-line regular, 36 cm from the incisors. - A gastric bypass was found, characterized by an intact staple line. - Non-obstructing non-bleeding gastric ulcers with a clean ulcer base (Forrest Class III). Biopsied. - Normal examined duodenum. - Normal examined jejunum. Recommendations: - Patient has a contact number available for emergencies. The signs and symptoms of potential delayed complications were discussed with the patient. Return to normal activities tomorrow. Written discharge instructions were provided to the patient. - Full liquid diet today and advance as tolerated tomorrow. - Use Protonix (pantoprazole) 40 mg IV BID while inpatient, switch to PO protonix on discharge. - Use sucralfate tablets 1 gram PO QID for 1 month. - No ibuprofen, naproxen, or other non-steroidal anti-inflammatory drugs. - Ok to continue low dose Aspirin and Plavix given h/o recent PCI with DES - Hold Eliquis for 5 days - Await pathology results, f/u H.pylori and trear if positive. - Follow up with Cardiology - Return to GI office at the next available appointment after discharge. - Ok to discharge home tomorrow if Hgb remains stable.   Subjective: Patient seen and examined at bedside, resting comfortably.  Feels back to her normal baseline.  Hemoglobin stable, up to 10.5 this morning.  Ready for discharge home.  No other concerns/complaints at this time.  Denies headache, no fever/chills/night sweats, no chest pain, no palpitations, no dizziness, no nauseous or vomit/diarrhea, no abdominal pain, no shortness of breath, no weakness, no paresthesias.  No acute events overnight per nurse staff.   Discharge Exam: Vitals:   10/29/18 0810 10/29/18 0850  BP:  (!) 132/52  Pulse:  75  Resp:  18  Temp:  98.5 F (36.9 C)  SpO2: 98% 97%   Vitals:   2018/11/27 2044 10/29/18 0417 10/29/18  0810 10/29/18 0850  BP:  (!) 130/53  (!) 132/52  Pulse:  65  75  Resp:  18  18  Temp:  97.7 F (36.5 C)  98.5 F (36.9 C)  TempSrc:  Oral  Oral  SpO2: 95% 97% 98% 97%  Weight:      Height:        General: Pt is alert, awake, not in acute distress Cardiovascular: RRR, S1/S2 +, no rubs, no gallops Respiratory: CTA bilaterally, no wheezing, no rhonchi Abdominal: Soft, NT, ND, bowel sounds + Extremities: no edema, no  cyanosis    The results of significant diagnostics from this hospitalization (including imaging, microbiology, ancillary and laboratory) are listed below for reference.     Microbiology: Recent Results (from the past 240 hour(s))  SARS Coronavirus 2 [facility] order, Performed in [facility] hospital lab) Nasopharyngeal Nasopharyngeal Swab     Status: None   Collection Time: 10/27/18  4:18 PM   Specimen: Nasopharyngeal Swab  Result Value Ref Range Status   SARS Coronavirus 2 NEGATIVE NEGATIVE Final    Comment: (NOTE) If result is NEGATIVE SARS-CoV-2 target nucleic acids are NOT DETECTED. The SARS-CoV-2 RNA is generally detectable in upper and lower  respiratory specimens during the acute phase of infection. The lowest  concentration of SARS-CoV-2 viral copies this assay can detect is 250  copies / mL. A negative result does not preclude SARS-CoV-2 infection  and should not be used as the sole basis for treatment or other  patient management decisions.  A negative result may occur with  improper specimen collection / handling, submission of specimen other  than nasopharyngeal swab, presence of viral mutation(s) within the  areas targeted by this assay, and inadequate number of viral copies  (<250 copies / mL). A negative result must be combined with clinical  observations, patient history, and epidemiological information. If result is POSITIVE SARS-CoV-2 target nucleic acids are DETECTED. The SARS-CoV-2 RNA is generally detectable in upper and lower  respiratory  specimens dur ing the acute phase of infection.  Positive  results are indicative of active infection with SARS-CoV-2.  Clinical  correlation with patient history and other diagnostic information is  necessary to determine patient infection status.  Positive results do  not rule out bacterial infection or co-infection with other viruses. If result is PRESUMPTIVE POSTIVE SARS-CoV-2 nucleic acids MAY BE PRESENT.   A presumptive positive result was obtained on the submitted specimen  and confirmed on repeat testing.  While 2019 novel coronavirus  (SARS-CoV-2) nucleic acids may be present in the submitted sample  additional confirmatory testing may be necessary for epidemiological  and / or clinical management purposes  to differentiate between  SARS-CoV-2 and other Sarbecovirus currently known to infect humans.  If clinically indicated additional testing with an alternate test  methodology [phone]) is advised. The SARS-CoV-2 RNA is generally  detectable in upper and lower respiratory sp ecimens during the acute  phase of infection. The expected result is Negative. Fact Sheet for Patients:  StrictlyIdeas.no Fact Sheet for Healthcare Providers: BankingDealers.co.za This test is not yet approved or cleared by the [place] FDA and has been authorized for detection and/or diagnosis of SARS-CoV-2 by FDA under an Emergency Use Authorization (EUA).  This EUA will remain in effect (meaning this test can be used) for the duration of the COVID-19 declaration under Section 564(b)(1) of the Act, 21 U.S.C. section 360bbb-3(b)(1), unless the authorization is terminated or revoked sooner. Performed at [facility], [place] [address], [place], [place] [address]      Labs: BNP (last 3 results) No results for input(s): BNP in the last 8760 hours. Basic Metabolic Panel: Recent Labs  Lab 10/27/18 1325  NA 139  K 4.3  CL 108  CO2 23  GLUCOSE  116*  BUN 18  CREATININE 0.86  CALCIUM 8.7*   Liver Function Tests: No results for input(s): AST, ALT, ALKPHOS, BILITOT, PROT, ALBUMIN in the last 168 hours. No results for input(s): LIPASE, AMYLASE in the last 168 hours. No results for input(s): AMMONIA in the last  168 hours. CBC: Recent Labs  Lab 10/27/18 1325 10/28/18 0610 10/29/18 0843  WBC 7.4 5.9 4.8  HGB 6.8* 9.8* 10.5*  HCT 20.3* 29.4* 31.7*  MCV 96.7 91.9 93.5  PLT 235 202 229   Cardiac Enzymes: No results for input(s): CKTOTAL, CKMB, CKMBINDEX, TROPONINI in the last 168 hours. BNP: Invalid input(s): POCBNP CBG: Recent Labs  Lab 10/27/18 1425  GLUCAP 115*   D-Dimer No results for input(s): DDIMER in the last 72 hours. Hgb A1c No results for input(s): HGBA1C in the last 72 hours. Lipid Profile No results for input(s): CHOL, HDL, LDLCALC, TRIG, CHOLHDL, LDLDIRECT in the last 72 hours. Thyroid function studies No results for input(s): TSH, T4TOTAL, T3FREE, THYROIDAB in the last 72 hours.  Invalid input(s): FREET3 Anemia work up No results for input(s): VITAMINB12, FOLATE, FERRITIN, TIBC, IRON, RETICCTPCT in the last 72 hours. Urinalysis    Component Value Date/Time   COLORURINE YELLOW 10/27/2018 [place] 10/27/2018 1317   LABSPEC 1.010 10/27/2018 1317   PHURINE 5.0 10/27/2018 1317   GLUCOSEU NEGATIVE 10/27/2018 1317   GLUCOSEU NEGATIVE 08/01/2018 1431   HGBUR NEGATIVE 10/27/2018 1317   [place] 10/27/2018 1317   KETONESUR NEGATIVE 10/27/2018 1317   PROTEINUR NEGATIVE 10/27/2018 1317   UROBILINOGEN 0.2 08/01/2018 1431   NITRITE POSITIVE (A) 10/27/2018 1317   LEUKOCYTESUR SMALL (A) 10/27/2018 1317   Sepsis Labs Invalid input(s): PROCALCITONIN,  WBC,  LACTICIDVEN Microbiology Recent Results (from the past 240 hour(s))  SARS Coronavirus 2 [facility] order, Performed in [facility] hospital lab) Nasopharyngeal Nasopharyngeal Swab     Status: None   Collection Time: 10/27/18   4:18 PM   Specimen: Nasopharyngeal Swab  Result Value Ref Range Status   SARS Coronavirus 2 NEGATIVE NEGATIVE Final    Comment: (NOTE) If result is NEGATIVE SARS-CoV-2 target nucleic acids are NOT DETECTED. The SARS-CoV-2 RNA is generally detectable in upper and lower  respiratory specimens during the acute phase of infection. The lowest  concentration of SARS-CoV-2 viral copies this assay can detect is 250  copies / mL. A negative result does not preclude SARS-CoV-2 infection  and should not be used as the sole basis for treatment or other  patient management decisions.  A negative result may occur with  improper specimen collection / handling, submission of specimen other  than nasopharyngeal swab, presence of viral mutation(s) within the  areas targeted by this assay, and inadequate number of viral copies  (<250 copies / mL). A negative result must be combined with clinical  observations, patient history, and epidemiological information. If result is POSITIVE SARS-CoV-2 target nucleic acids are DETECTED. The SARS-CoV-2 RNA is generally detectable in upper and lower  respiratory specimens dur ing the acute phase of infection.  Positive  results are indicative of active infection with SARS-CoV-2.  Clinical  correlation with patient history and other diagnostic information is  necessary to determine patient infection status.  Positive results do  not rule out bacterial infection or co-infection with other viruses. If result is PRESUMPTIVE POSTIVE SARS-CoV-2 nucleic acids MAY BE PRESENT.   A presumptive positive result was obtained on the submitted specimen  and confirmed on repeat testing.  While 2019 novel coronavirus  (SARS-CoV-2) nucleic acids may be present in the submitted sample  additional confirmatory testing may be necessary for epidemiological  and / or clinical management purposes  to differentiate between  SARS-CoV-2 and other Sarbecovirus currently known to infect  humans.  If clinically indicated additional testing with  an alternate test  methodology [phone]) is advised. The SARS-CoV-2 RNA is generally  detectable in upper and lower respiratory sp ecimens during the acute  phase of infection. The expected result is Negative. Fact Sheet for Patients:  StrictlyIdeas.no Fact Sheet for Healthcare Providers: BankingDealers.co.za This test is not yet approved or cleared by the [place] FDA and has been authorized for detection and/or diagnosis of SARS-CoV-2 by FDA under an Emergency Use Authorization (EUA).  This EUA will remain in effect (meaning this test can be used) for the duration of the COVID-19 declaration under Section 564(b)(1) of the Act, 21 U.S.C. section 360bbb-3(b)(1), unless the authorization is terminated or revoked sooner. Performed at [facility], [place] [address], [place], [place] [address]      Time coordinating discharge: Over 30 minutes  SIGNED:   Eric J [place] DO  [facility] 10/29/2018, 9:51 AM

## 2018-10-30 ENCOUNTER — Encounter (HOSPITAL_COMMUNITY): Payer: Self-pay | Admitting: Gastroenterology

## 2018-10-30 ENCOUNTER — Telehealth: Payer: Self-pay

## 2018-10-30 NOTE — Telephone Encounter (Signed)
LM for patient to return call for TCM. 

## 2018-10-31 DIAGNOSIS — I10 Essential (primary) hypertension: Secondary | ICD-10-CM | POA: Diagnosis not present

## 2018-11-02 ENCOUNTER — Ambulatory Visit: Payer: Medicare Other | Admitting: Cardiology

## 2018-11-02 NOTE — Telephone Encounter (Signed)
Please read

## 2018-11-08 ENCOUNTER — Encounter: Payer: Self-pay | Admitting: Cardiology

## 2018-11-08 ENCOUNTER — Other Ambulatory Visit: Payer: Self-pay

## 2018-11-08 ENCOUNTER — Ambulatory Visit (INDEPENDENT_AMBULATORY_CARE_PROVIDER_SITE_OTHER): Payer: Medicare Other | Admitting: Cardiology

## 2018-11-08 VITALS — BP 133/70 | HR 66 | Temp 98.6°F | Ht 60.0 in | Wt 210.0 lb

## 2018-11-08 DIAGNOSIS — Z6841 Body Mass Index (BMI) 40.0 and over, adult: Secondary | ICD-10-CM

## 2018-11-08 DIAGNOSIS — Z8719 Personal history of other diseases of the digestive system: Secondary | ICD-10-CM

## 2018-11-08 DIAGNOSIS — I10 Essential (primary) hypertension: Secondary | ICD-10-CM

## 2018-11-08 DIAGNOSIS — I2510 Atherosclerotic heart disease of native coronary artery without angina pectoris: Secondary | ICD-10-CM

## 2018-11-08 DIAGNOSIS — I480 Paroxysmal atrial fibrillation: Secondary | ICD-10-CM | POA: Diagnosis not present

## 2018-11-08 NOTE — Patient Instructions (Addendum)
Continue Eliquis  Stop Aspirin today and indefinitely  Stop Plavix on 11/22/2018 and then will be on Eliquis only  High risk for bleeding; therefore, benefits do not out weight the risk for dual antiplatelet therapy. Also do not have other significant CAD except for stent.

## 2018-11-08 NOTE — Progress Notes (Signed)
Primary Physician:  [name], DO   Patient ID: Heather Christensen, female    DOB: 04/17/1950, 68 y.o.   MRN: [id number]  Subjective:    Chief Complaint  Patient presents with  . Coronary Artery Disease  . Hypertension  . Follow-up    2 months    HPI: Heather Christensen  is a 68 y.o. female  with hypertension, hyperlipidemia, hypothyroidism, paroxysmal atrial fibrillation by zio patch in Jan 2019 now on Eliquis, diastolic dysfunction, and CAD s/p PCI on 08/21/18 to proximal D1.   Patient was admitted to the hospital on 10/28/2018 for melena for 2 days found to have anemia with acute blood loss secondary to upper GI ulcer by EGD on 10/28/2018. She was given 2 PRBC and started on PPI and sulfrate. ASA and plavix were resumed, Eliquis was held for 5 days. She was previously advised to stop ASA and be on Plavix and Eliquis alone; however, was still taking all 3. She now presents for hospital follow up.  Prior to being seen in the hospital, she was having exertional chest pain, shortness of breath, and fatigue. She is scheduled to go back to see GI in Sept. she has had complete resolution in chest discomfort and shortness of breath and weakness is slowly improving.  She is overall feeling much better.  Has not had any known recurrence of melena or bleeding.   Past Medical History:  Diagnosis Date  . Alcohol abuse    Sober 80yrs   . Anemia    pernicious anemia  . Arthritis   . Asthma    seasonal, advair inhaler  . Complication of anesthesia    slow to wake  . Depression   . Dysrhythmia    palpitations, PVCs, SVT  . Eating disorder   . Heart murmur   . Hypertension   . Hypothyroidism   . Migraines   . Thyroid disease     Past Surgical History:  Procedure Laterality Date  . BIOPSY  10/28/2018   Procedure: BIOPSY;  Surgeon: Mauri Pole, MD;  Location: [facility];  Service: Endoscopy;;  . BREAST SURGERY Right 2016   lumpectomy - begin  . CORONARY STENT  INTERVENTION N/A 08/21/2018   Procedure: CORONARY STENT INTERVENTION;  Surgeon: Nigel Mormon, MD;  Location: [facility];  Service: Cardiovascular;  Laterality: N/A;  . ESOPHAGOGASTRODUODENOSCOPY (EGD) WITH PROPOFOL N/A 10/28/2018   Procedure: ESOPHAGOGASTRODUODENOSCOPY (EGD) WITH PROPOFOL;  Surgeon: Mauri Pole, MD;  Location: [facility];  Service: Endoscopy;  Laterality: N/A;  . EYE SURGERY     cataracts  . LEFT HEART CATH AND CORONARY ANGIOGRAPHY N/A 08/21/2018   Procedure: LEFT HEART CATH AND CORONARY ANGIOGRAPHY;  Surgeon: Nigel Mormon, MD;  Location: [facility];  Service: Cardiovascular;  Laterality: N/A;  . leg surgeries     fell leg got caught in a step stool and the toilet in the bathroom and crushed leg  . NECK SURGERY     lypoma removed from left side up behind ear (size of hand) beign  . REVERSE SHOULDER ARTHROPLASTY Left 11/09/2017   Procedure: REVERSE LEFT SHOULDER ARTHROPLASTY;  Surgeon: Justice Britain, MD;  Location: [facility];  Service: Orthopedics;  Laterality: Left;  . SHOULDER SURGERY Left    rotator cuff, ball broke   . TONSILLECTOMY  1958    Social History   Socioeconomic History  . Marital status: Soil scientist    Spouse name: Not on file  . Number  of children: 1  . Years of education: Not on file  . Highest education level: Not on file  Occupational History    Employer: [employer]  Social Needs  . Financial resource strain: Not on file  . Food insecurity    Worry: Not on file    Inability: Not on file  . Transportation needs    Medical: Not on file    Non-medical: Not on file  Tobacco Use  . Smoking status: Former Smoker    Packs/day: 1.00    Years: 10.00    Pack years: 10.00    Types: Cigarettes    Quit date: 07/05/1988    Years since quitting: 30.3  . Smokeless tobacco: Never Used  Substance and Sexual Activity  . Alcohol use: Never    Frequency: Never  . Drug use: Never    Comment: recovery for 30 years   . Sexual activity: Yes    Partners: Male  Lifestyle  . Physical activity    Days per week: Not on file    Minutes per session: Not on file  . Stress: Not on file  Relationships  . Social Herbalist on phone: Not on file    Gets together: Not on file    Attends religious service: Not on file    Active member of club or organization: Not on file    Attends meetings of clubs or organizations: Not on file    Relationship status: Not on file  . Intimate partner violence    Fear of current or ex partner: Not on file    Emotionally abused: Not on file    Physically abused: Not on file    Forced sexual activity: Not on file  Other Topics Concern  . 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 She has history of alcohol abuse but has been sober for over 30 years.       Review of Systems  Constitution: Negative for decreased appetite, malaise/fatigue, weight gain and weight loss.  Eyes: Negative for visual disturbance.  Cardiovascular: Positive for chest pain, dyspnea on exertion, leg swelling (chronic left leg since prior surgery) and palpitations. Negative for claudication, orthopnea and syncope.  Respiratory: Negative for hemoptysis and wheezing.   Endocrine: Negative for cold intolerance and heat intolerance.  Hematologic/Lymphatic: Negative for bleeding problem. Bruises/bleeds easily (some bruising since being on Eliquis).  Skin: Negative for nail changes.  Musculoskeletal: Positive for joint pain (left knee from previous injury). Negative for muscle weakness and myalgias.  Gastrointestinal: Negative for abdominal pain, change in bowel habit, nausea and vomiting.  Neurological: Negative for difficulty with concentration, dizziness, focal weakness and headaches.  Psychiatric/Behavioral: Negative for altered mental status and suicidal ideas.  All other systems reviewed and are negative.     Objective:   Blood pressure 133/70, pulse 66, temperature 98.6 F (37 C), height 5' (1.524 m), weight 210 lb (95.3 kg), SpO2 97 %. Body mass index is 41.01 kg/m.    Physical Exam  Constitutional: She is oriented to person, place, and time. Vital signs are normal.  Moderately built and morbidly obese  HENT:  Head: Normocephalic and atraumatic.  Neck: Normal range of motion.  Cardiovascular: Normal rate, regular rhythm, intact distal pulses and normal pulses.  Murmur heard.  Early systolic murmur is present with a grade of 2/6 at the upper right sternal border. Pulses:      Radial  pulses are 2+ on the right side and 2+ on the left side.  Left leg 2+ pitting ankle edema.   Pulmonary/Chest: Effort normal and breath sounds normal. No accessory muscle usage. No respiratory distress.  Abdominal: Soft. Bowel sounds are normal.  Musculoskeletal: Normal range of motion.  Neurological: She is alert and oriented to person, place, and time.  Skin: Skin is warm and dry.  Vitals reviewed.  Radiology: No results found.  Laboratory examination:    CMP Latest Ref Rng & Units 10/27/2018 08/21/2018 08/01/2018  Glucose 70 - 99 mg/dL 116(H) 105(H) 98  BUN 8 - 23 mg/dL 18 20 17   Creatinine 0.44 - 1.00 mg/dL 0.86 1.04(H) 0.91  Sodium 135 - 145 mmol/L 139 143 141  Potassium 3.5 - 5.1 mmol/L 4.3 5.0 4.4  Chloride 98 - 111 mmol/L 108 110 103  CO2 22 - 32 mmol/L 23 24 28   Calcium 8.9 - 10.3 mg/dL 8.7(L) 9.1 9.3  Total Protein 6.0 - 8.3 g/dL - - -  Total Bilirubin 0.2 - 1.2 mg/dL - - -  Alkaline Phos 39 - 117 U/L - - -  AST 0 - 37 U/L - - -  ALT 0 - 35 U/L - - -   CBC Latest Ref Rng & Units 10/29/2018 10/28/2018 10/27/2018  WBC 4.0 - 10.5 K/uL 4.8 5.9 7.4  Hemoglobin 12.0 - 15.0 g/dL 10.5(L) 9.8(L) 6.8(LL)  Hematocrit 36.0 - 46.0 % 31.7(L) 29.4(L) 20.3(L)  Platelets 150 - 400 K/uL 229 202 235   Lipid Panel     Component Value Date/Time   CHOL 145 08/01/2018 1431   TRIG 82.0 08/01/2018 1431   HDL 54.70  08/01/2018 1431   CHOLHDL 3 08/01/2018 1431   VLDL 16.4 08/01/2018 1431   LDLCALC 74 08/01/2018 1431   HEMOGLOBIN A1C No results found for: HGBA1C, MPG TSH Recent Labs    04/27/18 09/26/18 1458  TSH 2.38 0.80    PRN Meds:. Medications Discontinued During This Encounter  Medication Reason  . TURMERIC PO Error  . Menthol, Topical Analgesic, (BIOFREEZE EX) Error  . aspirin EC 81 MG tablet Discontinued by provider   Current Meds  Medication Sig  . albuterol (PROVENTIL HFA;VENTOLIN HFA) 108 (90 Base) MCG/ACT inhaler Inhale 1-2 puffs into the lungs every 6 (six) hours as needed for wheezing or shortness of breath.  Marland Kitchen amLODipine (NORVASC) 10 MG tablet Take 10 mg by mouth daily after supper.  Marland Kitchen apixaban (ELIQUIS) 5 MG TABS tablet Take 1 tablet (5 mg total) by mouth 2 (two) times daily. Restart on 8/22  . atorvastatin (LIPITOR) 10 MG tablet TAKE 1 TABLET BY MOUTH EVERY DAY (Patient taking differently: Take 10 mg by mouth daily. )  . citalopram (CELEXA) 40 MG tablet Take 1 tablet (40 mg total) by mouth at bedtime.  . clopidogrel (PLAVIX) 75 MG tablet Take 1 tablet (75 mg total) by mouth daily with breakfast.  . cyanocobalamin (,VITAMIN B-12,) 1000 MCG/ML injection 1000 mcg (1 mg) injection once per month. (Patient taking differently: Inject 1,000 mcg into the muscle every Wednesday. )  . fluticasone (FLONASE) 50 MCG/ACT nasal spray Place 2 sprays into both nostrils daily. (Patient taking differently: Place 2 sprays into both nostrils daily as needed (seasonal allergies). )  . Fluticasone-Salmeterol (ADVAIR) 250-50 MCG/DOSE AEPB Inhale 1 puff into the lungs 2 (two) times daily as needed (shortness of breath/seasonal allergies).   . gabapentin (NEURONTIN) 600 MG tablet Take 600 mg by mouth at bedtime.  . isosorbide mononitrate (IMDUR) 30  MG 24 hr tablet TAKE 1 TABLET(30 MG) BY MOUTH DAILY (Patient taking differently: Take 60 mg by mouth daily. )  . levothyroxine (SYNTHROID, LEVOTHROID) 75 MCG  tablet Take 1 tablet (75 mcg total) by mouth daily before breakfast.  . MELATONIN PO Take 1 tablet by mouth at bedtime as needed (sleep).  . metoprolol succinate (TOPROL-XL) 50 MG 24 hr tablet TAKE 1 TABLET BY MOUTH  DAILY IN THE EVENING (Patient taking differently: Take 50 mg by mouth daily after supper. )  . Multiple Vitamin (MULTIVITAMIN WITH MINERALS) TABS tablet Take 1 tablet by mouth daily.  . nitroGLYCERIN (NITROSTAT) 0.4 MG SL tablet Place 1 tablet (0.4 mg total) under the tongue every 5 (five) minutes as needed for chest pain.  . pantoprazole (PROTONIX) 40 MG tablet Take 1 tablet (40 mg total) by mouth 2 (two) times daily.  Marland Kitchen spironolactone (ALDACTONE) 25 MG tablet Take 1 tablet (25 mg total) by mouth daily.  . sucralfate (CARAFATE) 1 GM/10ML suspension Take 10 mLs (1 g total) by mouth 4 (four) times daily -  with meals and at bedtime.  . Vitamin D, Ergocalciferol, (DRISDOL) 1.25 MG (50000 UT) CAPS capsule Take 1 capsule (50,000 Units total) by mouth every 7 (seven) days. (Patient taking differently: Take 50,000 Units by mouth every Wednesday. )  . [DISCONTINUED] aspirin EC 81 MG tablet Take 81 mg by mouth daily.    Cardiac Studies:   Cath 08/21/2018: LM: Short vessel. Mild disease LAD: Mild to moderate proximal and mid disease. Medium sized D1 with proximal 80% stenosis LCx: Dominant vessel. No significant disease RCA: Small, nondominant vessel. No significant disease Elevated LVEDP.  Successful percutaneous coronary intervention prox D1 PTCA and stent placement Resolute Onyx 2.0 X 15 mm drug-eluting stent  Coronary CTA with Calcium score 07/05/2018:  1. Coronary calcium score of 276. This was 89th percentile for age and sex matched control. 2. Normal coronary origin with left dominance. 3. There is severe (>70%) obstruction in proximal D1. 4. There is mild to moderate plaque in the mid LAD. 5. There is non-obstructive plaque in LM. 6. Recommend cardiac catheterization. 7.  Recommend aggressive risk factor reduction and high potency statin.  Echocardiogram  03/16/2018: Left ventricle cavity is normal in size. Mild concentric hypertrophy of the left ventricle. Normal global wall motion. Doppler evidence of grade II (pseudonormal) diastolic dysfunction, elevated LAP. Calculated EF 55%. Left atrial cavity is mildly dilated. at 4.0 cm. Trileaflet aortic valve with mild (Grade I) regurgitation. Structurally normal mitral valve with trace regurgitation. Mild tricuspid regurgitation. No evidence of pulmonary hypertension.  14 day event monitor 02/23/2018-03/08/2018: Dominant rhythm normal sinus. 18 patient triggered events occurred with reported symptoms of chest pain, shortness of breath, rapid/fast heart rate, tiredness/fatigue, that correlated with normal sinus rhythm with occasional PAC. No A. fib or atrial flutter was noted.   10 day event monitor 03/31/2017-04/10/2017:1% atrial fibrillation with a 34 minute episode. Multiple short runs of SVT. Occasional PVCs.    Assessment:     ICD-10-CM   1. Atherosclerosis of native coronary artery of native heart without angina pectoris  I25.10   2. Paroxysmal atrial fibrillation (HCC)  I48.0   3. History of GI bleed  Z87.19   4. Essential hypertension  I10   5. Class 3 severe obesity due to excess calories with serious comorbidity and body mass index (BMI) of 40.0 to 44.9 in adult [facility])  E66.01    Z68.41    EKG 10/17/2018: Normal sinus rhythm at the rate  of 60 bpm, normal axis, nonspecific T abnormality.  Normal QT interval. No significant change from  EKG 08/31/2018.  CHA2DS2-VASCScore: Risk Score  4,  Yearly risk of stroke  4.0%. Recommendation: Anticoagulation OAC Has Bled: Score 3.  Estimated risk of major bleeding at 1 year with [place] 4.9-19.6%   Recommendations:   Patient is doing much better since being out of the hospital for upper GI bleed related to gastric ulcers.  I have reviewed endoscopy report, had  both bleeding and nonbleeding ulcerations.  She is scheduled to see GI later in September.  Feel that her bleeding risk is high.  She underwent PCI in June 2 D1, no other significant CAD was noted on coronary angiogram.  As her bleeding risk is high, feel that she can safely be taken off of Plavix 3 months post PCI.  She was given instructions to stop aspirin today indefinitely as this may also exacerbate her nonbleeding ulcers.  She will stop Plavix on 9/10, and continue with Eliquis alone given her history of paroxysmal A. fib and CHADSVASC score.  She will contact me for any recurrence of bleeding.  No further symptoms of angina.  Blood pressure is well controlled.  Otherwise she is on appropriate medical therapy and will continue the same.  I will see her back in 6 months or sooner if needed.  Urged her to continue with efforts towards weight loss.  Miquel Dunn, MD, [facility] 11/08/2018, 12:22 PM [facility]. [place] Pager: [phone] Office: [phone] If no answer Cell [phone]

## 2018-11-09 ENCOUNTER — Other Ambulatory Visit: Payer: Self-pay

## 2018-11-09 ENCOUNTER — Encounter: Payer: Self-pay | Admitting: Family Medicine

## 2018-11-09 ENCOUNTER — Ambulatory Visit (INDEPENDENT_AMBULATORY_CARE_PROVIDER_SITE_OTHER): Payer: Medicare Other | Admitting: Family Medicine

## 2018-11-09 VITALS — Ht 60.0 in | Wt 210.0 lb

## 2018-11-09 DIAGNOSIS — I480 Paroxysmal atrial fibrillation: Secondary | ICD-10-CM

## 2018-11-09 DIAGNOSIS — D510 Vitamin B12 deficiency anemia due to intrinsic factor deficiency: Secondary | ICD-10-CM

## 2018-11-09 DIAGNOSIS — D500 Iron deficiency anemia secondary to blood loss (chronic): Secondary | ICD-10-CM | POA: Diagnosis not present

## 2018-11-09 DIAGNOSIS — Z87891 Personal history of nicotine dependence: Secondary | ICD-10-CM

## 2018-11-09 DIAGNOSIS — K250 Acute gastric ulcer with hemorrhage: Secondary | ICD-10-CM

## 2018-11-09 DIAGNOSIS — I2510 Atherosclerotic heart disease of native coronary artery without angina pectoris: Secondary | ICD-10-CM

## 2018-11-09 NOTE — Patient Instructions (Signed)
You have an appointment scheduled for: []   2D Mammogram  [x]   3D Mammogram  []   Bone Density      Your appointment will at the following location  [x]   [facility]      [place], [place]            Make sure to wear two peace clothing  No lotions powders or deodorants the day of the appointment Make sure to bring picture ID and insurance card.  Bring list of medications you are currently taking including any supplements.

## 2018-11-09 NOTE — Progress Notes (Signed)
Virtual Visit via Video   Due to the COVID-19 pandemic, this visit was completed with telemedicine (audio/video) technology to reduce patient and provider exposure as well as to preserve personal protective equipment.   I connected with Maryln Gottron by a video enabled telemedicine application and verified that I am speaking with the correct person using two identifiers. Location patient: Home Location provider: [facility], Office Persons participating in the virtual visit: Shakti EZELLE MCELHONE, [name], DO Lonell Grandchild, CMA acting as scribe for Dr. [name].   I discussed the limitations of evaluation and management by telemedicine and the availability of in person appointments. The patient expressed understanding and agreed to proceed.  Care Team   Patient Care Team: [name], DO as PCP - General (Family Medicine) Cardiovascular, [place] as Consulting Physician Justice Britain, MD as Consulting Physician (Orthopedic Surgery)  Subjective:   HPI:  Patient was admitted to the hospital on 10/28/2018 for melena for 2 days found to have anemia with acute blood loss secondary to upper GI ulcer by EGD on 10/28/2018. She was given 2 PRBC and started on PPI and sulfrate. ASA and plavix were resumed, Eliquis was held for 5 days. She was previously advised to stop ASA and be on Plavix and Eliquis alone; however, was still taking all 3. She now presents for hospital follow up.  Feels much better. No new concerns.  Review of Systems  Constitutional: Negative for chills and fever.  HENT: Negative for hearing loss and tinnitus.   Eyes: Negative for blurred vision and double vision.  Respiratory: Negative for cough and wheezing.   Cardiovascular: Negative for chest pain and palpitations.  Gastrointestinal: Negative for nausea and vomiting.  Genitourinary: Negative for dysuria and urgency.  Neurological: Negative for tingling and headaches.  Psychiatric/Behavioral:  Negative for depression and suicidal ideas.    Patient Active Problem List   Diagnosis Date Noted  . Iron deficiency anemia due to chronic blood loss 11/12/2018  . Obesity, morbid, BMI 40.0-49.9 ([place]) 11/09/2018  . Stable angina ([place]) 08/21/2018  . Coronary artery disease involving native coronary artery 08/13/2018  . Calcific bursitis of shoulder 07/17/2018  . Paresthesias 06/17/2018  . HTN (hypertension) 06/17/2018  . Moderate persistent asthma with acute exacerbation 06/17/2018  . Dysthymia 06/17/2018  . Seasonal allergies 06/17/2018  . Former smoker 06/17/2018  . Pernicious anemia 06/17/2018  . Paroxysmal atrial fibrillation ([place]) 05/11/2018  . Palpitations 05/11/2018  . Hyperlipidemia 05/11/2018  . Systolic murmur [id number]  . Hypothyroid 05/11/2018  . S/p reverse total shoulder arthroplasty 11/09/2017    Social History   Tobacco Use  . Smoking status: Former Smoker    Packs/day: 1.00    Years: 10.00    Pack years: 10.00    Types: Cigarettes    Quit date: 07/05/1988    Years since quitting: 30.3  . Smokeless tobacco: Never Used  Substance Use Topics  . Alcohol use: Never    Frequency: Never    Current Outpatient Medications:  .  albuterol (PROVENTIL HFA;VENTOLIN HFA) 108 (90 Base) MCG/ACT inhaler, Inhale 1-2 puffs into the lungs every 6 (six) hours as needed for wheezing or shortness of breath., Disp: 1 Inhaler, Rfl: 0 .  amLODipine (NORVASC) 10 MG tablet, Take 10 mg by mouth daily after supper., Disp: , Rfl:  .  apixaban (ELIQUIS) 5 MG TABS tablet, Take 1 tablet (5 mg total) by mouth 2 (two) times daily. Restart on 8/22, Disp: , Rfl:  .  atorvastatin (LIPITOR) 10 MG  tablet, TAKE 1 TABLET BY MOUTH EVERY DAY (Patient taking differently: Take 10 mg by mouth daily. ), Disp: 90 tablet, Rfl: 1 .  citalopram (CELEXA) 40 MG tablet, Take 1 tablet (40 mg total) by mouth at bedtime., Disp: 90 tablet, Rfl: 1 .  clopidogrel (PLAVIX) 75 MG tablet, Take 1 tablet (75 mg total) by  mouth daily with breakfast., Disp: 90 tablet, Rfl: 2 .  cyanocobalamin (,VITAMIN B-12,) 1000 MCG/ML injection, 1000 mcg (1 mg) injection once per month. (Patient taking differently: Inject 1,000 mcg into the muscle every Wednesday. ), Disp: 6 mL, Rfl: 0 .  fluticasone (FLONASE) 50 MCG/ACT nasal spray, Place 2 sprays into both nostrils daily. (Patient taking differently: Place 2 sprays into both nostrils daily as needed (seasonal allergies). ), Disp: 16 g, Rfl: 6 .  Fluticasone-Salmeterol (ADVAIR) 250-50 MCG/DOSE AEPB, Inhale 1 puff into the lungs 2 (two) times daily as needed (shortness of breath/seasonal allergies). , Disp: , Rfl:  .  gabapentin (NEURONTIN) 600 MG tablet, Take 600 mg by mouth at bedtime., Disp: , Rfl:  .  isosorbide mononitrate (IMDUR) 30 MG 24 hr tablet, TAKE 1 TABLET(30 MG) BY MOUTH DAILY (Patient taking differently: Take 60 mg by mouth daily. ), Disp: 30 tablet, Rfl: 1 .  levothyroxine (SYNTHROID, LEVOTHROID) 75 MCG tablet, Take 1 tablet (75 mcg total) by mouth daily before breakfast., Disp: 90 tablet, Rfl: 1 .  MELATONIN PO, Take 1 tablet by mouth at bedtime as needed (sleep)., Disp: , Rfl:  .  metoprolol succinate (TOPROL-XL) 50 MG 24 hr tablet, TAKE 1 TABLET BY MOUTH  DAILY IN THE EVENING (Patient taking differently: Take 50 mg by mouth daily after supper. ), Disp: 90 tablet, Rfl: 3 .  Multiple Vitamin (MULTIVITAMIN WITH MINERALS) TABS tablet, Take 1 tablet by mouth daily., Disp: , Rfl:  .  nitroGLYCERIN (NITROSTAT) 0.4 MG SL tablet, Place 1 tablet (0.4 mg total) under the tongue every 5 (five) minutes as needed for chest pain., Disp: 25 tablet, Rfl: 2 .  pantoprazole (PROTONIX) 40 MG tablet, Take 1 tablet (40 mg total) by mouth 2 (two) times daily., Disp: 180 tablet, Rfl: 0 .  spironolactone (ALDACTONE) 25 MG tablet, Take 1 tablet (25 mg total) by mouth daily., Disp: 30 tablet, Rfl: 2 .  sucralfate (CARAFATE) 1 GM/10ML suspension, Take 10 mLs (1 g total) by mouth 4 (four) times  daily -  with meals and at bedtime., Disp: 1200 mL, Rfl: 0 .  Vitamin D, Ergocalciferol, (DRISDOL) 1.25 MG (50000 UT) CAPS capsule, Take 1 capsule (50,000 Units total) by mouth every 7 (seven) days. (Patient taking differently: Take 50,000 Units by mouth every Wednesday. ), Disp: 12 capsule, Rfl: 0  Allergies  Allergen Reactions  . Sulfa Antibiotics Other (See Comments)    STEVENS JOHNSON  . Sulfamethoxazole Other (See Comments)    STEVENS JOHNSON  . Quinolones Swelling and Other (See Comments)    Facial swelling   . Codeine Nausea And Vomiting   Objective:   VITALS: Per patient if applicable, see vitals. GENERAL: Alert, appears well and in no acute distress. HEENT: Atraumatic, conjunctiva clear, no obvious abnormalities on inspection of external nose and ears. NECK: Normal movements of the head and neck. CARDIOPULMONARY: No increased WOB. Speaking in clear sentences. I:E ratio WNL.  MS: Moves all visible extremities without noticeable abnormality. PSYCH: Pleasant and cooperative, well-groomed. Speech normal rate and rhythm. Affect is appropriate. Insight and judgement are appropriate. Attention is focused, linear, and appropriate.  NEURO: CN grossly  intact. Oriented as arrived to appointment on time with no prompting. Moves both UE equally.  SKIN: No obvious lesions, wounds, erythema, or cyanosis noted on face or hands.  Depression screen [facility] 2/9 11/09/2018 06/13/2018  Decreased Interest 0 0  Down, Depressed, Hopeless 0 0  PHQ - 2 Score 0 0  Altered sleeping 0 2  Tired, decreased energy 0 3  Change in appetite 0 2  Feeling bad or failure about yourself  0 0  Trouble concentrating 0 1  Moving slowly or fidgety/restless 0 0  Suicidal thoughts 0 0  PHQ-9 Score 0 8  Difficult doing work/chores Not difficult at all Not difficult at all   Assessment and Plan:   Shemika was seen today for follow-up.  Diagnoses and all orders for this visit:  Iron deficiency anemia due to chronic  blood loss Comments: Due for labs. Orders: -     CBC with Differential/Platelet; Future -     Comprehensive metabolic panel; Future -     Iron, TIBC and Ferritin Panel; Future  Paroxysmal atrial fibrillation (H[facility]) Comments: On Eliquis. Will continue. Reviewed signs of blood loss and plan.  Obesity, morbid, BMI 40.0-49.9 ([place]) Comments: Discussed healthy food choices and exercise (to start slowly and only as tolerated).  Former smoker  Pernicious anemia Comments: Due for labs. Orders: -     Vitamin B12; Future  Coronary artery disease involving native coronary artery of native heart without angina pectoris Comments: Stopped ASA. Will stop Plavix early September.   Acute gastric ulcer with hemorrhage Comments: Tolerating medications. No new symptoms. Will f/u with GI.    Marland Kitchen COVID-19 Education: The signs and symptoms of COVID-19 were discussed with the patient and how to seek care for testing if needed. The importance of social distancing was discussed today. . Reviewed expectations re: course of current medical issues. . Discussed self-management of symptoms. . Outlined signs and symptoms indicating need for more acute intervention. . Patient verbalized understanding and all questions were answered. Marland Kitchen Health Maintenance issues including appropriate healthy diet, exercise, and smoking avoidance were discussed with patient. . See orders for this visit as documented in the electronic medical record.  [name], DO  Records requested if needed. Time spent: 25 minutes, of which >50% was spent in obtaining information about her symptoms, reviewing her previous labs, evaluations, and treatments, counseling her about her condition (please see the discussed topics above), and developing a plan to further investigate it; she had a number of questions which I addressed.

## 2018-11-12 ENCOUNTER — Encounter: Payer: Self-pay | Admitting: Family Medicine

## 2018-11-12 DIAGNOSIS — D500 Iron deficiency anemia secondary to blood loss (chronic): Secondary | ICD-10-CM | POA: Insufficient documentation

## 2018-11-23 ENCOUNTER — Other Ambulatory Visit (INDEPENDENT_AMBULATORY_CARE_PROVIDER_SITE_OTHER): Payer: Medicare Other

## 2018-11-23 DIAGNOSIS — D500 Iron deficiency anemia secondary to blood loss (chronic): Secondary | ICD-10-CM

## 2018-11-23 DIAGNOSIS — D510 Vitamin B12 deficiency anemia due to intrinsic factor deficiency: Secondary | ICD-10-CM | POA: Diagnosis not present

## 2018-11-23 LAB — COMPREHENSIVE METABOLIC PANEL
ALT: 19 U/L (ref 0–35)
AST: 23 U/L (ref 0–37)
Albumin: 4.4 g/dL (ref 3.5–5.2)
Alkaline Phosphatase: 68 U/L (ref 39–117)
BUN: 22 mg/dL (ref 6–23)
CO2: 25 mEq/L (ref 19–32)
Calcium: 9.6 mg/dL (ref 8.4–10.5)
Chloride: 105 mEq/L (ref 96–112)
Creatinine, Ser: 0.99 mg/dL (ref 0.40–1.20)
GFR: 55.85 mL/min — ABNORMAL LOW (ref >60.00)
Glucose, Bld: 108 mg/dL — ABNORMAL HIGH (ref 70–99)
Potassium: 3.7 mEq/L (ref 3.5–5.1)
Sodium: 142 mEq/L (ref 135–145)
Total Bilirubin: 0.5 mg/dL (ref 0.2–1.2)
Total Protein: 6.7 g/dL (ref 6.0–8.3)

## 2018-11-23 LAB — VITAMIN B12: Vitamin B-12: 679 pg/mL (ref 211–911)

## 2018-11-23 LAB — CBC WITH DIFFERENTIAL/PLATELET
Basophils Absolute: 0 10*3/uL (ref 0.0–0.1)
Basophils Relative: 0.7 % (ref 0.0–3.0)
Eosinophils Absolute: 0.1 10*3/uL (ref 0.0–0.7)
Eosinophils Relative: 1.3 % (ref 0.0–5.0)
HCT: 36.8 % (ref 36.0–46.0)
Hemoglobin: 12.1 g/dL (ref 12.0–15.0)
Lymphocytes Relative: 28.2 % (ref 12.0–46.0)
Lymphs Abs: 2 10*3/uL (ref 0.7–4.0)
MCHC: 32.9 g/dL (ref 30.0–36.0)
MCV: 87.7 fl (ref 78.0–100.0)
Monocytes Absolute: 0.5 10*3/uL (ref 0.1–1.0)
Monocytes Relative: 7.6 % (ref 3.0–12.0)
Neutro Abs: 4.3 10*3/uL (ref 1.4–7.7)
Neutrophils Relative %: 62.2 % (ref 43.0–77.0)
Platelets: 322 10*3/uL (ref 150.0–400.0)
RBC: 4.2 Mil/uL (ref 3.87–5.11)
RDW: 16.1 % — ABNORMAL HIGH (ref 11.5–15.5)
WBC: 6.9 10*3/uL (ref 4.0–10.5)

## 2018-11-24 LAB — IRON,TIBC AND FERRITIN PANEL
%SAT: 10 % (calc) — ABNORMAL LOW (ref 16–45)
Ferritin: 14 ng/mL — ABNORMAL LOW (ref 16–288)
Iron: 51 ug/dL (ref 45–160)
TIBC: 494 mcg/dL (calc) — ABNORMAL HIGH (ref 250–450)

## 2018-11-26 ENCOUNTER — Other Ambulatory Visit: Payer: Self-pay

## 2018-11-26 ENCOUNTER — Encounter: Payer: Self-pay | Admitting: Family Medicine

## 2018-11-26 DIAGNOSIS — D510 Vitamin B12 deficiency anemia due to intrinsic factor deficiency: Secondary | ICD-10-CM

## 2018-11-30 ENCOUNTER — Other Ambulatory Visit: Payer: Self-pay

## 2018-11-30 ENCOUNTER — Ambulatory Visit (HOSPITAL_COMMUNITY)
Admission: RE | Admit: 2018-11-30 | Discharge: 2018-11-30 | Disposition: A | Discharge status: Home / Self Care | Payer: Medicare Other | Source: Ambulatory Visit | Attending: Internal Medicine | Admitting: Internal Medicine

## 2018-11-30 DIAGNOSIS — D510 Vitamin B12 deficiency anemia due to intrinsic factor deficiency: Secondary | ICD-10-CM | POA: Diagnosis not present

## 2018-11-30 DIAGNOSIS — I480 Paroxysmal atrial fibrillation: Secondary | ICD-10-CM

## 2018-11-30 MED ORDER — ISOSORBIDE MONONITRATE ER 30 MG PO TB24: ORAL_TABLET | ORAL | 1 refills | Status: DC

## 2018-11-30 MED ORDER — SODIUM CHLORIDE 0.9 % IV SOLN
INTRAVENOUS | Status: DC | PRN
  Administered 2018-11-30: 250 mL via INTRAVENOUS

## 2018-11-30 MED ORDER — SPIRONOLACTONE 25 MG PO TABS: 25.0000 mg | ORAL_TABLET | Freq: Every day | ORAL | 2 refills | Status: AC

## 2018-11-30 MED ORDER — SODIUM CHLORIDE 0.9 % IV SOLN
510.0000 mg | Freq: Once | INTRAVENOUS | Status: AC
  Administered 2018-11-30: 510 mg via INTRAVENOUS
  Filled 2018-11-30: qty 17

## 2018-11-30 NOTE — Discharge Instructions (Signed)

## 2018-11-30 NOTE — Progress Notes (Signed)
PATIENT CARE CENTER NOTE  Diagnosis: Pernicious anemia (D51.0)   Provider: [name], DO   Procedure: IV Feraheme   Note: Patient received Feraheme infusion. Tolerated well with no adverse reaction. Observed patient for 30 minutes post-infusion. Vital signs stable. Discharge instructions given. Patient to come back next week for second infusion. Alert, oriented and ambulatory at discharge.

## 2018-12-06 ENCOUNTER — Encounter: Payer: Self-pay | Admitting: Gastroenterology

## 2018-12-06 ENCOUNTER — Other Ambulatory Visit: Payer: Self-pay

## 2018-12-06 ENCOUNTER — Ambulatory Visit (INDEPENDENT_AMBULATORY_CARE_PROVIDER_SITE_OTHER): Payer: Medicare Other | Admitting: Gastroenterology

## 2018-12-06 VITALS — BP 140/66 | HR 76 | Temp 97.6°F | Ht 60.0 in | Wt 224.4 lb

## 2018-12-06 DIAGNOSIS — D500 Iron deficiency anemia secondary to blood loss (chronic): Secondary | ICD-10-CM

## 2018-12-06 DIAGNOSIS — E538 Deficiency of other specified B group vitamins: Secondary | ICD-10-CM | POA: Diagnosis not present

## 2018-12-06 DIAGNOSIS — K259 Gastric ulcer, unspecified as acute or chronic, without hemorrhage or perforation: Secondary | ICD-10-CM

## 2018-12-06 DIAGNOSIS — Z9884 Bariatric surgery status: Secondary | ICD-10-CM | POA: Diagnosis not present

## 2018-12-06 NOTE — Patient Instructions (Signed)
We will refer you to Dr Redmond Pulling at [facility] for revision for Gastric Bypass   Follow up labs every 3 months with PMD (CBC and Iron panel)  Follow up in 6 months  I appreciate the  opportunity to care for you  Thank You   Harl Bowie , MD

## 2018-12-06 NOTE — Progress Notes (Addendum)
Heather Christensen    [id number]    [birth year]  Primary Care Physician:Wallace, Danae Chen, DO  Referring Physician: [name], [address],  [place] [address]   Chief complaint: Fatigue  HPI:  68 year old female status post gastric bypass with iron deficiency anemia. She continues to have severe fatigue.  Dark stool with oral iron Denies any NSAID use No family history of GI malignancy.  EGD October 28, 2018: Showed a large opening into the excluded stomach with multiple gastric antral ulcers  Colonoscopy about 10 years ago in [place].  History of A. fib, CAD status post PCI with drug-eluting stent August 21, 2018 on aspirin, Plavix and Eliquis.  CBC Latest Ref Rng & Units 11/23/2018 10/29/2018 10/28/2018  WBC 4.0 - 10.5 K/uL 6.9 4.8 5.9  Hemoglobin 12.0 - 15.0 g/dL 12.1 10.5(L) 9.8(L)  Hematocrit 36.0 - 46.0 % 36.8 31.7(L) 29.4(L)  Platelets 150.0 - 400.0 K/uL 322.0 229 202   Iron/TIBC/Ferritin/ %Sat    Component Value Date/Time   IRON 51 11/23/2018 1358   TIBC 494 (H) 11/23/2018 1358   FERRITIN 14 (L) 11/23/2018 1358   IRONPCTSAT 10 (L) 11/23/2018 1358    Outpatient Encounter Medications as of 12/06/2018  Medication Sig   albuterol (PROVENTIL HFA;VENTOLIN HFA) 108 (90 Base) MCG/ACT inhaler Inhale 1-2 puffs into the lungs every 6 (six) hours as needed for wheezing or shortness of breath.   amLODipine (NORVASC) 10 MG tablet Take 10 mg by mouth daily after supper.   atorvastatin (LIPITOR) 10 MG tablet TAKE 1 TABLET BY MOUTH EVERY DAY (Patient taking differently: Take 10 mg by mouth daily. )   citalopram (CELEXA) 40 MG tablet Take 1 tablet (40 mg total) by mouth at bedtime.   cyanocobalamin (,VITAMIN B-12,) 1000 MCG/ML injection 1000 mcg (1 mg) injection once per month. (Patient taking differently: Inject 1,000 mcg into the muscle every Wednesday. )   fluticasone (FLONASE) 50 MCG/ACT nasal spray Place 2 sprays into both nostrils daily. (Patient  taking differently: Place 2 sprays into both nostrils daily as needed (seasonal allergies). )   Fluticasone-Salmeterol (ADVAIR) 250-50 MCG/DOSE AEPB Inhale 1 puff into the lungs 2 (two) times daily as needed (shortness of breath/seasonal allergies).    gabapentin (NEURONTIN) 600 MG tablet Take 600 mg by mouth at bedtime.   isosorbide mononitrate (IMDUR) 30 MG 24 hr tablet TAKE 1 TABLET(30 MG) BY MOUTH DAILY   levothyroxine (SYNTHROID, LEVOTHROID) 75 MCG tablet Take 1 tablet (75 mcg total) by mouth daily before breakfast.   MELATONIN PO Take 1 tablet by mouth at bedtime as needed (sleep).   metoprolol succinate (TOPROL-XL) 50 MG 24 hr tablet TAKE 1 TABLET BY MOUTH  DAILY IN THE EVENING (Patient taking differently: Take 50 mg by mouth daily after supper. )   Multiple Vitamin (MULTIVITAMIN WITH MINERALS) TABS tablet Take 1 tablet by mouth daily.   pantoprazole (PROTONIX) 40 MG tablet Take 1 tablet (40 mg total) by mouth 2 (two) times daily.   spironolactone (ALDACTONE) 25 MG tablet Take 1 tablet (25 mg total) by mouth daily.   apixaban (ELIQUIS) 5 MG TABS tablet Take 1 tablet (5 mg total) by mouth 2 (two) times daily. Restart on 8/22   Cholecalciferol (VITAMIN D) 50 MCG (2000 UT) CAPS Take 1 capsule by mouth daily.   nitroGLYCERIN (NITROSTAT) 0.4 MG SL tablet Place 1 tablet (0.4 mg total) under the tongue every 5 (five) minutes as needed for chest pain.  sucralfate (CARAFATE) 1 GM/10ML suspension Take 10 mLs (1 g total) by mouth 4 (four) times daily -  with meals and at bedtime.   [DISCONTINUED] clopidogrel (PLAVIX) 75 MG tablet Take 1 tablet (75 mg total) by mouth daily with breakfast.   [DISCONTINUED] Vitamin D, Ergocalciferol, (DRISDOL) 1.25 MG (50000 UT) CAPS capsule Take 1 capsule (50,000 Units total) by mouth every 7 (seven) days. (Patient taking differently: Take 50,000 Units by mouth every Wednesday. )   No facility-administered encounter medications on file as of 12/06/2018.      Allergies as of 12/06/2018 - Review Complete 12/06/2018  Allergen Reaction Noted   Sulfa antibiotics Other (See Comments) 02/27/2015   Sulfamethoxazole Other (See Comments) 04/16/2012   Quinolones Swelling and Other (See Comments) 04/16/2012   Codeine Nausea And Vomiting 09/05/2017    Past Medical History:  Diagnosis Date   Alcohol abuse    Sober 71yrs    Anemia    pernicious anemia   Arthritis    Asthma    seasonal, advair inhaler   Complication of anesthesia    slow to wake   Depression    Dysrhythmia    palpitations, PVCs, SVT   Eating disorder    GI bleed    Heart murmur    Hypertension    Hypothyroidism    Migraines    Thyroid disease     Past Surgical History:  Procedure Laterality Date   BIOPSY  10/28/2018   Procedure: BIOPSY;  Surgeon: Mauri Pole, MD;  Location: [facility];  Service: Endoscopy;;   BREAST SURGERY Right 2016   lumpectomy - begin   CORONARY STENT INTERVENTION N/A 08/21/2018   Procedure: CORONARY STENT INTERVENTION;  Surgeon: Nigel Mormon, MD;  Location: [facility];  Service: Cardiovascular;  Laterality: N/A;   ESOPHAGOGASTRODUODENOSCOPY (EGD) WITH PROPOFOL N/A 10/28/2018   Procedure: ESOPHAGOGASTRODUODENOSCOPY (EGD) WITH PROPOFOL;  Surgeon: Mauri Pole, MD;  Location: [facility];  Service: Endoscopy;  Laterality: N/A;   EYE SURGERY     cataracts   LEFT HEART CATH AND CORONARY ANGIOGRAPHY N/A 08/21/2018   Procedure: LEFT HEART CATH AND CORONARY ANGIOGRAPHY;  Surgeon: Nigel Mormon, MD;  Location: [facility];  Service: Cardiovascular;  Laterality: N/A;   LEG SURGERY     multiple; fell leg got caught in a step stool and the toilet in the bathroom and crushed leg   NECK SURGERY     lypoma removed from left side up behind ear (size of hand) beign   REVERSE SHOULDER ARTHROPLASTY Left 11/09/2017   Procedure: REVERSE LEFT SHOULDER ARTHROPLASTY;  Surgeon: Justice Britain, MD;   Location: [facility];  Service: Orthopedics;  Laterality: Left;   SHOULDER SURGERY Left    rotator cuff, ball broke    TONSILLECTOMY  1958    Family History  Problem Relation Age of Onset   Arthritis Mother    Depression Mother    Heart disease Mother    Hypertension Mother    Stroke Mother    Cervical cancer Mother    Alcohol abuse Father    Depression Father    Drug abuse Father    Heart disease Father    Hypertension Father    Lung cancer Father    Parkinson's disease Sister    COPD Sister    Depression Sister    Heart disease Sister    Hypertension Sister    Lung cancer Sister    Heart disease Brother    Hypertension Brother  Brain cancer Maternal Grandmother    Depression Maternal Grandmother    Heart disease Maternal Grandfather    Alcohol abuse Paternal Grandmother    Depression Paternal Grandmother    GI Bleed Paternal Grandmother        bleeding ulcer   Esophageal cancer Neg Hx    Colon cancer Neg Hx    Stomach cancer Neg Hx    Pancreatic cancer Neg Hx     Social History   Socioeconomic History   Marital status: Soil scientist    Spouse name: Not on file   Number of children: 1   Years of education: Not on file   Highest education level: Not on file  Occupational History    Employer: Engineer, mining  Social Needs   Financial resource strain: Not on file   Food insecurity    Worry: Not on file    Inability: Not on file   Transportation needs    Medical: Not on file    Non-medical: Not on file  Tobacco Use   Smoking status: Former Smoker    Packs/day: 1.00    Years: 10.00    Pack years: 10.00    Types: Cigarettes    Quit date: 07/05/1988    Years since quitting: 30.4   Smokeless tobacco: Never Used  Substance and Sexual Activity   Alcohol use: Not Currently    Frequency: Never    Comment: recovery x 31 years as of 12/06/18   Drug use: Never    Comment:     Sexual activity: Yes    Partners: Male    Lifestyle   Physical activity    Days per week: Not on file    Minutes per session: Not on file   Stress: Not on file  Relationships   Social connections    Talks on phone: Not on file    Gets together: Not on file    Attends religious service: Not on file    Active member of club or organization: Not on file    Attends meetings of clubs or organizations: Not on file    Relationship status: Not on file   Intimate partner violence    Fear of current or ex partner: Not on file    Emotionally abused: Not on file    Physically abused: Not on file    Forced sexual activity: Not on file  Other Topics Concern   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 She has history of alcohol abuse but has been sober for over 30 years.         Review of systems: Review of Systems  Constitutional: Negative for fever and chills. Positive for lack of energy. HENT: Positive for ringing in ears Eyes: Negative for blurred vision.  Respiratory: Negative for cough, shortness of breath and wheezing.   Cardiovascular: Negative for chest pain and positive for palpitations.  Gastrointestinal: as per HPI Genitourinary: Negative for dysuria, urgency, frequency and hematuria.  Musculoskeletal: Positive for myalgias, back pain and joint pain.  Skin: Negative for itching and rash.  Neurological: Negative for dizziness, tremors, focal weakness, seizures and loss of consciousness.  Endo/Heme/Allergies: Positive for seasonal allergies.  Psychiatric/Behavioral: Negative for depression, suicidal ideas and hallucinations.  Positive for anxiety All other systems reviewed and are negative.   Physical Exam: Vitals:   12/06/18 0927  BP: 140/66  Pulse: 76  Temp: 97.6 F (36.4 C)  Body mass index is 43.83 kg/m. Gen:      No acute distress HEENT:  EOMI, sclera anicteric Neck:     No masses; no thyromegaly Lungs:    Clear to  auscultation bilaterally; normal respiratory effort CV:         Regular rate and rhythm; no murmurs Abd:      + bowel sounds; soft, non-tender; no palpable masses, no distension Ext:    No edema; adequate peripheral perfusion Skin:      Warm and dry; no rash Neuro: alert and oriented x 3 Psych: normal mood and affect  Data Reviewed:  Reviewed labs, radiology imaging, old records and pertinent past GI work up   Assessment and Plan/Recommendations:  68 year old female with history of paroxysmal A. fib on Eliquis, CAD status post PCI with drug-eluting stent placement August 21, 2018 on aspirin and Plavix. Recently hospitalized August 2020 with acute GI bleed, worsening iron deficiency anemia.  EGD multiple gastric antral and prepyloric ulcers with large fistulous opening into the excluded stomach  Gastric biopsies negative for H. Pylori  Avoid NSAIDs  Continue Protonix twice daily  Recheck CBC, CMP and iron panel  She is receiving IV iron infusion through Dr. Juleen China  Will need routine monitoring of CBC and iron panel every 3 months  Advised patient to start taking bariatric dose multivitamin with iron  Will refer to Dr. Redmond Pulling for evaluation regarding revision of the gastric bypass   Due for colorectal cancer screening, will plan to schedule it in 3 months once its safe for patient to hold anticoagulation if cleared by cardiology.  Given recent PCI with drug-eluting stent in June 2020, she will need at least 6 months of uninterrupted antiplatelet therapy.  25 minutes was spent face-to-face with the patient. Greater than 50% of the time used for counseling as well as treatment plan and follow-up. She had multiple questions which were answered to her satisfaction  K. Denzil Magnuson , MD    CC: [name], DO

## 2018-12-07 ENCOUNTER — Ambulatory Visit (HOSPITAL_COMMUNITY)
Admission: RE | Admit: 2018-12-07 | Discharge: 2018-12-07 | Disposition: A | Discharge status: Home / Self Care | Payer: Medicare Other | Source: Ambulatory Visit | Attending: Internal Medicine | Admitting: Internal Medicine

## 2018-12-07 DIAGNOSIS — D510 Vitamin B12 deficiency anemia due to intrinsic factor deficiency: Secondary | ICD-10-CM | POA: Diagnosis not present

## 2018-12-07 MED ORDER — SODIUM CHLORIDE 0.9 % IV SOLN
INTRAVENOUS | Status: DC | PRN
  Administered 2018-12-07: 08:00:00 250 mL via INTRAVENOUS

## 2018-12-07 MED ORDER — SODIUM CHLORIDE 0.9 % IV SOLN
510.0000 mg | Freq: Once | INTRAVENOUS | Status: AC
  Administered 2018-12-07: 09:00:00 510 mg via INTRAVENOUS
  Filled 2018-12-07: qty 17

## 2018-12-07 NOTE — Discharge Instructions (Signed)

## 2018-12-07 NOTE — Progress Notes (Signed)
Patient received Feraheme via PIV. Observed for at least 30 minutes post infusion.Tolerated well, vitals stable, discharge instructions given, verbalized understanding. Patient alert, oriented and ambulatory at the time of discharge.  

## 2018-12-10 ENCOUNTER — Other Ambulatory Visit: Payer: Self-pay

## 2018-12-10 DIAGNOSIS — E039 Hypothyroidism, unspecified: Secondary | ICD-10-CM

## 2018-12-10 DIAGNOSIS — F341 Dysthymic disorder: Secondary | ICD-10-CM

## 2018-12-10 MED ORDER — LEVOTHYROXINE SODIUM 75 MCG PO TABS: 75.0000 ug | ORAL_TABLET | Freq: Every day | ORAL | 1 refills | Status: AC

## 2018-12-10 MED ORDER — CITALOPRAM HYDROBROMIDE 40 MG PO TABS: 40.0000 mg | ORAL_TABLET | Freq: Every day | ORAL | 3 refills | Status: AC

## 2018-12-12 ENCOUNTER — Encounter: Payer: Self-pay | Admitting: Gastroenterology

## 2018-12-12 ENCOUNTER — Telehealth: Payer: Self-pay | Admitting: *Deleted

## 2018-12-12 NOTE — Telephone Encounter (Signed)
Faxed referral to Dr Redmond Pulling at [facility] today

## 2018-12-12 NOTE — Telephone Encounter (Signed)
Referral to Dr Redmond Pulling at [facility] for revision of Gastric Bypass surgery

## 2018-12-14 ENCOUNTER — Other Ambulatory Visit: Payer: Self-pay

## 2018-12-14 ENCOUNTER — Ambulatory Visit (INDEPENDENT_AMBULATORY_CARE_PROVIDER_SITE_OTHER): Payer: Medicare Other | Admitting: Family Medicine

## 2018-12-14 ENCOUNTER — Encounter: Payer: Self-pay | Admitting: Family Medicine

## 2018-12-14 ENCOUNTER — Ambulatory Visit: Payer: Self-pay

## 2018-12-14 VITALS — BP 118/72 | HR 69 | Ht 60.0 in

## 2018-12-14 DIAGNOSIS — M7530 Calcific tendinitis of unspecified shoulder: Secondary | ICD-10-CM | POA: Diagnosis not present

## 2018-12-14 DIAGNOSIS — M5030 Other cervical disc degeneration, unspecified cervical region: Secondary | ICD-10-CM | POA: Diagnosis not present

## 2018-12-14 DIAGNOSIS — G8929 Other chronic pain: Secondary | ICD-10-CM | POA: Diagnosis not present

## 2018-12-14 DIAGNOSIS — M25511 Pain in right shoulder: Secondary | ICD-10-CM

## 2018-12-14 MED ORDER — PREDNISONE 20 MG PO TABS: 40.0000 mg | ORAL_TABLET | Freq: Every day | ORAL | 0 refills | Status: DC

## 2018-12-14 NOTE — Assessment & Plan Note (Signed)
Patient did not respond very well to the injection.  At this point patient will be started on prednisone again, gabapentin, and will be referred to formal physical therapy but I think will be beneficial.  If patient fails this we will consider the possibility of MRI.  Follow-up at that time

## 2018-12-14 NOTE — Progress Notes (Signed)
[name] [address] [place], [place] [address] Phone: [phone] Subjective:   Heather Christensen, am serving as a scribe for Dr. Hulan Saas.    CC: shoulder pain   [id number]   07/17/2018 Patient given an injection and tolerated the procedure well.  Patient did have some resolution of pain immediately.  I am concerned that could be a possible brachial neuritis as well.  Taking gabapentin at night.  Encouraged her to potentially take the 600 again.  Discussed with patient about icing regimen, home exercise, x-rays pending.  Work with Product/process development scientist to learn home exercises in greater detail.  Follow-up again in 4 to 6 weeks  Update 12/14/2018 Heather Christensen is a 68 y.o. female coming in with complaint of right shoulder pain. Patient is having pain over the top of her shoulder. Radiates down to her right hand. Injection given in May only lasted a few days. Is not able to sleep. Is using gabapentin at night. Would like to try something other than injection today.    Past Medical History:  Diagnosis Date  . Alcohol abuse    Sober 62yrs   . Anemia    pernicious anemia  . Arthritis   . Asthma    seasonal, advair inhaler  . Complication of anesthesia    slow to wake  . Depression   . Dysrhythmia    palpitations, PVCs, SVT  . Eating disorder   . GI bleed   . Heart murmur   . Hypertension   . Hypothyroidism   . Migraines   . Thyroid disease    Past Surgical History:  Procedure Laterality Date  . BIOPSY  10/28/2018   Procedure: BIOPSY;  Surgeon: Mauri Pole, MD;  Location: [facility];  Service: Endoscopy;;  . BREAST SURGERY Right 2016   lumpectomy - begin  . CORONARY STENT INTERVENTION N/A 08/21/2018   Procedure: CORONARY STENT INTERVENTION;  Surgeon: Nigel Mormon, MD;  Location: [facility];  Service: Cardiovascular;  Laterality: N/A;  . ESOPHAGOGASTRODUODENOSCOPY (EGD) WITH PROPOFOL N/A 10/28/2018   Procedure:  ESOPHAGOGASTRODUODENOSCOPY (EGD) WITH PROPOFOL;  Surgeon: Mauri Pole, MD;  Location: [facility];  Service: Endoscopy;  Laterality: N/A;  . EYE SURGERY     cataracts  . LEFT HEART CATH AND CORONARY ANGIOGRAPHY N/A 08/21/2018   Procedure: LEFT HEART CATH AND CORONARY ANGIOGRAPHY;  Surgeon: Nigel Mormon, MD;  Location: [facility];  Service: Cardiovascular;  Laterality: N/A;  . LEG SURGERY     multiple; fell leg got caught in a step stool and the toilet in the bathroom and crushed leg  . NECK SURGERY     lypoma removed from left side up behind ear (size of hand) beign  . REVERSE SHOULDER ARTHROPLASTY Left 11/09/2017   Procedure: REVERSE LEFT SHOULDER ARTHROPLASTY;  Surgeon: Justice Britain, MD;  Location: [facility];  Service: Orthopedics;  Laterality: Left;  . SHOULDER SURGERY Left    rotator cuff, ball broke   . TONSILLECTOMY  1958   Social History   Socioeconomic History  . Marital status: Soil scientist    Spouse name: Not on file  . Number of children: 1  . Years of education: Not on file  . Highest education level: Not on file  Occupational History    Employer: [employer]  Social Needs  . Financial resource strain: Not on file  . Food insecurity    Worry: Not on file  Inability: Not on file  . Transportation needs    Medical: Not on file    Non-medical: Not on file  Tobacco Use  . Smoking status: Former Smoker    Packs/day: 1.00    Years: 10.00    Pack years: 10.00    Types: Cigarettes    Quit date: 07/05/1988    Years since quitting: 30.4  . Smokeless tobacco: Never Used  Substance and Sexual Activity  . Alcohol use: Not Currently    Frequency: Never    Comment: recovery x 31 years as of 12/06/18  . Drug use: Never    Comment:    . Sexual activity: Yes    Partners: Male  Lifestyle  . Physical activity    Days per week: Not on file    Minutes per session: Not on file  . Stress: Not on file  Relationships  . Social Herbalist  on phone: Not on file    Gets together: Not on file    Attends religious service: Not on file    Active member of club or organization: Not on file    Attends meetings of clubs or organizations: Not on file    Relationship status: Not on file  Other Topics Concern  . 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 She has history of alcohol abuse but has been sober for over 30 years.      Allergies  Allergen Reactions  . Sulfa Antibiotics Other (See Comments)    STEVENS JOHNSON  . Sulfamethoxazole Other (See Comments)    STEVENS JOHNSON  . Quinolones Swelling and Other (See Comments)    Facial swelling   . Codeine Nausea And Vomiting   Family History  Problem Relation Age of Onset  . Arthritis Mother   . Depression Mother   . Heart disease Mother   . Hypertension Mother   . Stroke Mother   . Cervical cancer Mother   . Alcohol abuse Father   . Depression Father   . Drug abuse Father   . Heart disease Father   . Hypertension Father   . Lung cancer Father   . Parkinson's disease Sister   . COPD Sister   . Depression Sister   . Heart disease Sister   . Hypertension Sister   . Lung cancer Sister   . Heart disease Brother   . Hypertension Brother   . Brain cancer Maternal Grandmother   . Depression Maternal Grandmother   . Heart disease Maternal Grandfather   . Alcohol abuse Paternal Grandmother   . Depression Paternal Grandmother   . GI Bleed Paternal Grandmother        bleeding ulcer  . Esophageal cancer Neg Hx   . Colon cancer Neg Hx   . Stomach cancer Neg Hx   . Pancreatic cancer Neg Hx     Current Outpatient Medications (Endocrine & Metabolic):  .  levothyroxine (SYNTHROID) 75 MCG tablet, Take 1 tablet (75 mcg total) by mouth daily before breakfast. .  predniSONE (DELTASONE) 20 MG tablet, Take 2 tablets (40 mg total) by mouth daily with breakfast.  Current Outpatient Medications  (Cardiovascular):  .  amLODipine (NORVASC) 10 MG tablet, Take 10 mg by mouth daily after supper. Marland Kitchen  atorvastatin (LIPITOR) 10 MG tablet, TAKE 1 TABLET BY MOUTH EVERY DAY (Patient taking differently: Take 10 mg by mouth daily. ) .  isosorbide mononitrate (IMDUR) 30  MG 24 hr tablet, TAKE 1 TABLET(30 MG) BY MOUTH DAILY .  metoprolol succinate (TOPROL-XL) 50 MG 24 hr tablet, TAKE 1 TABLET BY MOUTH  DAILY IN THE EVENING (Patient taking differently: Take 50 mg by mouth daily after supper. ) .  nitroGLYCERIN (NITROSTAT) 0.4 MG SL tablet, Place 1 tablet (0.4 mg total) under the tongue every 5 (five) minutes as needed for chest pain. Marland Kitchen  spironolactone (ALDACTONE) 25 MG tablet, Take 1 tablet (25 mg total) by mouth daily.  Current Outpatient Medications (Respiratory):  .  albuterol (PROVENTIL HFA;VENTOLIN HFA) 108 (90 Base) MCG/ACT inhaler, Inhale 1-2 puffs into the lungs every 6 (six) hours as needed for wheezing or shortness of breath. .  fluticasone (FLONASE) 50 MCG/ACT nasal spray, Place 2 sprays into both nostrils daily. (Patient taking differently: Place 2 sprays into both nostrils daily as needed (seasonal allergies). ) .  Fluticasone-Salmeterol (ADVAIR) 250-50 MCG/DOSE AEPB, Inhale 1 puff into the lungs 2 (two) times daily as needed (shortness of breath/seasonal allergies).    Current Outpatient Medications (Hematological):  .  apixaban (ELIQUIS) 5 MG TABS tablet, Take 1 tablet (5 mg total) by mouth 2 (two) times daily. Restart on 8/22 .  cyanocobalamin (,VITAMIN B-12,) 1000 MCG/ML injection, 1000 mcg (1 mg) injection once per month. (Patient taking differently: Inject 1,000 mcg into the muscle every Wednesday. )  Current Outpatient Medications (Other):  Marland Kitchen  Cholecalciferol (VITAMIN D) 50 MCG (2000 UT) CAPS, Take 1 capsule by mouth daily. .  citalopram (CELEXA) 40 MG tablet, Take 1 tablet (40 mg total) by mouth at bedtime. .  gabapentin (NEURONTIN) 600 MG tablet, Take 600 mg by mouth at bedtime. Marland Kitchen   MELATONIN PO, Take 1 tablet by mouth at bedtime as needed (sleep). .  Multiple Vitamin (MULTIVITAMIN WITH MINERALS) TABS tablet, Take 1 tablet by mouth daily. .  pantoprazole (PROTONIX) 40 MG tablet, Take 1 tablet (40 mg total) by mouth 2 (two) times daily. .  sucralfate (CARAFATE) 1 GM/10ML suspension, Take 10 mLs (1 g total) by mouth 4 (four) times daily -  with meals and at bedtime.    Past medical history, social, surgical and family history all reviewed in electronic medical record.  Christensen pertanent information unless stated regarding to the chief complaint.   Review of Systems:  Christensen headache, visual changes, nausea, vomiting, diarrhea, constipation, dizziness, abdominal pain, skin rash, fevers, chills, night sweats, weight loss, swollen lymph nodes, body aches, joint swelling,chest pain, shortness of breath, mood changes.  Positive muscle aches  Objective  Blood pressure 118/72, pulse 69, height 5' (1.524 m), SpO2 98 %.    General: Christensen apparent distress alert and oriented x3 mood and affect normal, dressed appropriately.  HEENT: Pupils equal, extraocular movements intact  Respiratory: Patient's speak in full sentences and does not appear short of breath  Cardiovascular: Christensen lower extremity edema, non tender, Christensen erythema  Skin: Warm dry intact with Christensen signs of infection or rash on extremities or on axial skeleton.  Abdomen: Soft nontender  Neuro: Cranial nerves II through XII are intact, neurovascularly intact in all extremities with 2+ DTRs and 2+ pulses.  Lymph: Christensen lymphadenopathy of posterior or anterior cervical chain or axillae bilaterally.  Gait normal with good balance and coordination.  MSK:  Non tender with full range of motion and good stability and symmetric strength and tone of  elbows, wrist, hip, knee and ankles bilaterally.  Right shoulder exam shows some mild improvement with range of motion but still has a positive  impingement.  Neck exam though has loss of lordosis.   Positive Spurling's noted with radicular symptoms on the right side.  Weakness in the C6 distribution on the right side.   Impression and Recommendations:     This case required medical decision making of moderate complexity. The above documentation has been reviewed and is accurate and complete Lyndal Pulley, DO       Note: This dictation was prepared with Dragon dictation along with smaller phrase technology. Any transcriptional errors that result from this process are unintentional.

## 2018-12-14 NOTE — Assessment & Plan Note (Signed)
Degenerative disc in the cervical spine.  Discussed with patient, this could be more of a radicular symptoms.  Patient is on a blood thinner but could not be potentially bridged if epidurals are necessary.  Patient has going to do prednisone, gabapentin and see how patient responds to home exercise.

## 2018-12-14 NOTE — Patient Instructions (Addendum)
Horizant at night  Prednisone next 5 days [facility] PT will call you See me again in 4 weeks/ if not better in 2 let us know to get MRI

## 2018-12-19 ENCOUNTER — Other Ambulatory Visit: Payer: Self-pay

## 2018-12-19 ENCOUNTER — Telehealth: Payer: Self-pay | Admitting: *Deleted

## 2018-12-19 MED ORDER — HORIZANT 300 MG PO TBCR: 300.0000 mg | EXTENDED_RELEASE_TABLET | Freq: Every day | ORAL | 3 refills | Status: AC

## 2018-12-19 NOTE — Telephone Encounter (Signed)
Pt stated that she received samples of Horizant at her last OV & she would like a prescription sent into the pharmacy.

## 2018-12-19 NOTE — Telephone Encounter (Signed)
Sent patient MyChart message.

## 2018-12-21 ENCOUNTER — Other Ambulatory Visit: Payer: Self-pay | Admitting: Cardiology

## 2018-12-28 ENCOUNTER — Other Ambulatory Visit: Payer: Self-pay

## 2018-12-28 ENCOUNTER — Encounter: Payer: Self-pay | Admitting: Internal Medicine

## 2018-12-28 ENCOUNTER — Ambulatory Visit (INDEPENDENT_AMBULATORY_CARE_PROVIDER_SITE_OTHER): Payer: Medicare Other | Admitting: Internal Medicine

## 2018-12-28 ENCOUNTER — Ambulatory Visit: Payer: Medicare Other | Admitting: Physical Therapy

## 2018-12-28 VITALS — BP 110/70 | HR 62 | Temp 98.3°F | Ht 61.0 in | Wt 223.8 lb

## 2018-12-28 DIAGNOSIS — K279 Peptic ulcer, site unspecified, unspecified as acute or chronic, without hemorrhage or perforation: Secondary | ICD-10-CM

## 2018-12-28 DIAGNOSIS — Z1211 Encounter for screening for malignant neoplasm of colon: Secondary | ICD-10-CM | POA: Diagnosis not present

## 2018-12-28 DIAGNOSIS — Z13820 Encounter for screening for osteoporosis: Secondary | ICD-10-CM

## 2018-12-28 DIAGNOSIS — I480 Paroxysmal atrial fibrillation: Secondary | ICD-10-CM

## 2018-12-28 DIAGNOSIS — D500 Iron deficiency anemia secondary to blood loss (chronic): Secondary | ICD-10-CM

## 2018-12-28 DIAGNOSIS — I2510 Atherosclerotic heart disease of native coronary artery without angina pectoris: Secondary | ICD-10-CM

## 2018-12-28 DIAGNOSIS — Z23 Encounter for immunization: Secondary | ICD-10-CM | POA: Diagnosis not present

## 2018-12-28 DIAGNOSIS — N3000 Acute cystitis without hematuria: Secondary | ICD-10-CM

## 2018-12-28 DIAGNOSIS — R300 Dysuria: Secondary | ICD-10-CM | POA: Diagnosis not present

## 2018-12-28 DIAGNOSIS — Z780 Asymptomatic menopausal state: Secondary | ICD-10-CM

## 2018-12-28 DIAGNOSIS — E039 Hypothyroidism, unspecified: Secondary | ICD-10-CM

## 2018-12-28 DIAGNOSIS — D510 Vitamin B12 deficiency anemia due to intrinsic factor deficiency: Secondary | ICD-10-CM

## 2018-12-28 DIAGNOSIS — Z1239 Encounter for other screening for malignant neoplasm of breast: Secondary | ICD-10-CM

## 2018-12-28 DIAGNOSIS — E782 Mixed hyperlipidemia: Secondary | ICD-10-CM

## 2018-12-28 DIAGNOSIS — I10 Essential (primary) hypertension: Secondary | ICD-10-CM

## 2018-12-28 LAB — POCT URINALYSIS DIPSTICK
Bilirubin, UA: NEGATIVE
Blood, UA: NEGATIVE
Glucose, UA: NEGATIVE
Ketones, UA: NEGATIVE
Nitrite, UA: POSITIVE
Protein, UA: NEGATIVE
Spec Grav, UA: 1.01 (ref 1.010–1.025)
Urobilinogen, UA: 0.2 E.U./dL
pH, UA: 6 (ref 5.0–8.0)

## 2018-12-28 LAB — CBC WITH DIFFERENTIAL/PLATELET
Basophils Absolute: 0 10*3/uL (ref 0.0–0.1)
Basophils Relative: 0.7 % (ref 0.0–3.0)
Eosinophils Absolute: 0.2 10*3/uL (ref 0.0–0.7)
Eosinophils Relative: 2.6 % (ref 0.0–5.0)
HCT: 40.8 % (ref 36.0–46.0)
Hemoglobin: 13.5 g/dL (ref 12.0–15.0)
Lymphocytes Relative: 23.3 % (ref 12.0–46.0)
Lymphs Abs: 1.4 10*3/uL (ref 0.7–4.0)
MCHC: 33 g/dL (ref 30.0–36.0)
MCV: 90.7 fl (ref 78.0–100.0)
Monocytes Absolute: 0.6 10*3/uL (ref 0.1–1.0)
Monocytes Relative: 10.2 % (ref 3.0–12.0)
Neutro Abs: 3.9 10*3/uL (ref 1.4–7.7)
Neutrophils Relative %: 63.2 % (ref 43.0–77.0)
Platelets: 255 10*3/uL (ref 150.0–400.0)
RBC: 4.5 Mil/uL (ref 3.87–5.11)
RDW: 18.7 % — ABNORMAL HIGH (ref 11.5–15.5)
WBC: 6.2 10*3/uL (ref 4.0–10.5)

## 2018-12-28 MED ORDER — NITROFURANTOIN MONOHYD MACRO 100 MG PO CAPS: 100.0000 mg | ORAL_CAPSULE | Freq: Two times a day (BID) | ORAL | 0 refills | Status: AC

## 2018-12-28 NOTE — Progress Notes (Signed)
Established Patient Office Visit     CC/Reason for Visit: Establish care, follow-up chronic conditions, discuss some acute concerns  HPI: Heather Christensen is a 68 y.o. female who is coming in today for the above mentioned reasons. Past Medical History is extensive but mostly significant for:  1.  Recent hospitalization for peptic ulcer disease with GI bleed.  She is followed by GI, she is now off aspirin and Plavix and on Eliquis only.  She is on daily iron supplementation.  She would like her iron levels and blood counts rechecked today.  2.  Coronary artery disease with stent placement earlier this year, followed by cardiology, no active anginal pain.  3.  History of pernicious anemia/B12 deficiency, she receives B12 IM supplementation on a monthly basis.  4.  Atrial fibrillation chronically anticoagulated on Coumadin.  5.  History of hypertension that has been well controlled.  6.  History of seasonal asthma, she feels well from this currently and is on no chronic suppressive medications.  7.  History of hypothyroidism with a TSH of 2.380 in February on 75 mcg of Synthroid.  8.  She is a former alcoholic who has been sober for about 30 years.  9.  Multiple orthopedic issues including a left shoulder replacement, calcific bursitis of the right shoulder, in 2008 she suffered a crush left leg injury that required prolonged hospitalization with subsequent staph bacteremia requiring removal of hardware.  She feels like she has a urinary tract infection, she has had burning when she urinates frequency and back pain.  No fevers   She has allergies to quinolones and sulfa drugs.  Will receive flu and PCV 13 vaccinations today.  Past Medical/Surgical History: Past Medical History:  Diagnosis Date  . Alcohol abuse    Sober 41yrs   . Anemia    pernicious anemia  . Arthritis   . Asthma    seasonal, advair inhaler  . Complication of anesthesia    slow to wake  . Depression    . Dysrhythmia    palpitations, PVCs, SVT  . Eating disorder   . GI bleed   . Heart murmur   . Hypertension   . Hypothyroidism   . Migraines   . Thyroid disease     Past Surgical History:  Procedure Laterality Date  . BIOPSY  10/28/2018   Procedure: BIOPSY;  Surgeon: Mauri Pole, MD;  Location: [facility];  Service: Endoscopy;;  . BREAST SURGERY Right 2016   lumpectomy - begin  . CORONARY STENT INTERVENTION N/A 08/21/2018   Procedure: CORONARY STENT INTERVENTION;  Surgeon: Nigel Mormon, MD;  Location: [facility];  Service: Cardiovascular;  Laterality: N/A;  . ESOPHAGOGASTRODUODENOSCOPY (EGD) WITH PROPOFOL N/A 10/28/2018   Procedure: ESOPHAGOGASTRODUODENOSCOPY (EGD) WITH PROPOFOL;  Surgeon: Mauri Pole, MD;  Location: [facility];  Service: Endoscopy;  Laterality: N/A;  . EYE SURGERY     cataracts  . LEFT HEART CATH AND CORONARY ANGIOGRAPHY N/A 08/21/2018   Procedure: LEFT HEART CATH AND CORONARY ANGIOGRAPHY;  Surgeon: Nigel Mormon, MD;  Location: [facility];  Service: Cardiovascular;  Laterality: N/A;  . LEG SURGERY     multiple; fell leg got caught in a step stool and the toilet in the bathroom and crushed leg  . NECK SURGERY     lypoma removed from left side up behind ear (size of hand) beign  . REVERSE SHOULDER ARTHROPLASTY Left 11/09/2017   Procedure: REVERSE LEFT SHOULDER ARTHROPLASTY;  Surgeon: Justice Britain, MD;  Location: [facility];  Service: Orthopedics;  Laterality: Left;  . SHOULDER SURGERY Left    rotator cuff, ball broke   . TONSILLECTOMY  1958    Social History:  reports that she quit smoking about 30 years ago. Her smoking use included cigarettes. She has a 10.00 pack-year smoking history. She has never used smokeless tobacco. She reports previous alcohol use. She reports that she does not use drugs.  Allergies: Allergies  Allergen Reactions  . Sulfa Antibiotics Other (See Comments)    STEVENS JOHNSON  .  Sulfamethoxazole Other (See Comments)    STEVENS JOHNSON  . Quinolones Swelling and Other (See Comments)    Facial swelling   . Codeine Nausea And Vomiting    Family History:  Family History  Problem Relation Age of Onset  . Arthritis Mother   . Depression Mother   . Heart disease Mother   . Hypertension Mother   . Stroke Mother   . Cervical cancer Mother   . Alcohol abuse Father   . Depression Father   . Drug abuse Father   . Heart disease Father   . Hypertension Father   . Lung cancer Father   . Parkinson's disease Sister   . COPD Sister   . Depression Sister   . Heart disease Sister   . Hypertension Sister   . Lung cancer Sister   . Heart disease Brother   . Hypertension Brother   . Brain cancer Maternal Grandmother   . Depression Maternal Grandmother   . Heart disease Maternal Grandfather   . Alcohol abuse Paternal Grandmother   . Depression Paternal Grandmother   . GI Bleed Paternal Grandmother        bleeding ulcer  . Esophageal cancer Neg Hx   . Colon cancer Neg Hx   . Stomach cancer Neg Hx   . Pancreatic cancer Neg Hx      Current Outpatient Medications:  .  albuterol (PROVENTIL HFA;VENTOLIN HFA) 108 (90 Base) MCG/ACT inhaler, Inhale 1-2 puffs into the lungs every 6 (six) hours as needed for wheezing or shortness of breath., Disp: 1 Inhaler, Rfl: 0 .  amLODipine (NORVASC) 10 MG tablet, Take 10 mg by mouth daily after supper., Disp: , Rfl:  .  apixaban (ELIQUIS) 5 MG TABS tablet, Take 1 tablet (5 mg total) by mouth 2 (two) times daily. Restart on 8/22, Disp: , Rfl:  .  atorvastatin (LIPITOR) 10 MG tablet, TAKE 1 TABLET BY MOUTH EVERY DAY, Disp: 90 tablet, Rfl: 1 .  Cholecalciferol (VITAMIN D) 50 MCG (2000 UT) CAPS, Take 1 capsule by mouth daily., Disp: , Rfl:  .  citalopram (CELEXA) 40 MG tablet, Take 1 tablet (40 mg total) by mouth at bedtime., Disp: 90 tablet, Rfl: 3 .  cyanocobalamin (,VITAMIN B-12,) 1000 MCG/ML injection, 1000 mcg (1 mg) injection once  per month. (Patient taking differently: Inject 1,000 mcg into the muscle every Wednesday. ), Disp: 6 mL, Rfl: 0 .  fluticasone (FLONASE) 50 MCG/ACT nasal spray, Place 2 sprays into both nostrils daily. (Patient taking differently: Place 2 sprays into both nostrils daily as needed (seasonal allergies). ), Disp: 16 g, Rfl: 6 .  Fluticasone-Salmeterol (ADVAIR) 250-50 MCG/DOSE AEPB, Inhale 1 puff into the lungs 2 (two) times daily as needed (shortness of breath/seasonal allergies). , Disp: , Rfl:  .  Gabapentin Enacarbil ER (HORIZANT) 300 MG TBCR, Take 300 mg by mouth at bedtime., Disp: 30 tablet, Rfl: 3 .  isosorbide mononitrate (IMDUR)  30 MG 24 hr tablet, TAKE 1 TABLET(30 MG) BY MOUTH DAILY, Disp: 90 tablet, Rfl: 1 .  levothyroxine (SYNTHROID) 75 MCG tablet, Take 1 tablet (75 mcg total) by mouth daily before breakfast., Disp: 90 tablet, Rfl: 1 .  MELATONIN PO, Take 1 tablet by mouth at bedtime as needed (sleep)., Disp: , Rfl:  .  metoprolol succinate (TOPROL-XL) 50 MG 24 hr tablet, TAKE 1 TABLET BY MOUTH  DAILY IN THE EVENING (Patient taking differently: Take 50 mg by mouth daily after supper. ), Disp: 90 tablet, Rfl: 3 .  Multiple Vitamin (MULTIVITAMIN WITH MINERALS) TABS tablet, Take 1 tablet by mouth daily., Disp: , Rfl:  .  nitroGLYCERIN (NITROSTAT) 0.4 MG SL tablet, Place 1 tablet (0.4 mg total) under the tongue every 5 (five) minutes as needed for chest pain., Disp: 25 tablet, Rfl: 2 .  pantoprazole (PROTONIX) 40 MG tablet, Take 1 tablet (40 mg total) by mouth 2 (two) times daily., Disp: 180 tablet, Rfl: 0 .  spironolactone (ALDACTONE) 25 MG tablet, Take 1 tablet (25 mg total) by mouth daily., Disp: 30 tablet, Rfl: 2 .  nitrofurantoin, macrocrystal-monohydrate, (MACROBID) 100 MG capsule, Take 1 capsule (100 mg total) by mouth 2 (two) times daily for 7 days., Disp: 14 capsule, Rfl: 0  Review of Systems:  Constitutional: Denies fever, chills, diaphoresis, appetite change and fatigue.  HEENT: Denies  photophobia, eye pain, redness, hearing loss, ear pain, congestion, sore throat, rhinorrhea, sneezing, mouth sores, trouble swallowing, neck pain, neck stiffness and tinnitus.   Respiratory: Denies SOB, DOE, cough, chest tightness,  and wheezing.   Cardiovascular: Denies chest pain, palpitations and leg swelling.  Gastrointestinal: Denies nausea, vomiting, abdominal pain, diarrhea, constipation, blood in stool and abdominal distention.  Genitourinary: Positive for dysuria, urgency, frequency, hematuria, flank pain and difficulty urinating.  Endocrine: Denies: hot or cold intolerance, sweats, changes in hair or nails, polyuria, polydipsia. Musculoskeletal: Denies myalgias, back pain, joint swelling, arthralgias and gait problem.  Skin: Denies pallor, rash and wound.  Neurological: Denies dizziness, seizures, syncope, weakness, light-headedness, numbness and headaches.  Hematological: Denies adenopathy. Easy bruising, personal or family bleeding history  Psychiatric/Behavioral: Denies suicidal ideation, mood changes, confusion, nervousness, sleep disturbance and agitation    Physical Exam: Vitals:   12/28/18 1036  BP: 110/70  Pulse: 62  Temp: 98.3 F (36.8 C)  TempSrc: Temporal  SpO2: 96%  Weight: 223 lb 12.8 oz (101.5 kg)  Height: 5\' 1"  (1.549 m)    Body mass index is 42.29 kg/m.   Constitutional: NAD, calm, comfortable, obese Eyes: PERRL, lids and conjunctivae normal ENMT: Mucous membranes are moist. Respiratory: clear to auscultation bilaterally, no wheezing, no crackles. Normal respiratory effort. No accessory muscle use.  Cardiovascular: Regular rate and rhythm, no murmurs / rubs / gallops. No extremity edema. 2+ pedal pulses.  Abdomen: no tenderness, no masses palpated. No hepatosplenomegaly. Bowel sounds positive.  Musculoskeletal: no clubbing / cyanosis. No joint deformity upper and lower extremities. Good ROM, no contractures. Normal muscle tone.  Skin: no rashes,  lesions, ulcers. No induration Neurologic: Grossly intact and nonfocal Psychiatric: Normal judgment and insight. Alert and oriented x 3. Normal mood.    Impression and Plan:  Acute cystitis without hematuria -Urine dipstick positive for leukocytes and nitrates. -Send for UA and culture. -Given her allergies to sulfa and quinolones, will treat with Macrobid 100 mg twice daily.  Encounter for screening for malignant neoplasm of breast, unspecified screening modality  - Plan: MM Digital Screening  Screening for malignant neoplasm of  colon  - Plan: Ambulatory referral to Gastroenterology she already sees [facility].  Screening for osteoporosis  - Plan: DG Bone Density   Paroxysmal atrial fibrillation ([place]) -Rate controlled, anticoagulated on Eliquis, followed by cardiology.  Mixed hyperlipidemia -Most recent LDL of 74 in May 2020.  Hypothyroidism, unspecified type -On levothyroxine, TSH of 2.380 in February.  Essential hypertension -Well-controlled on current regimen.  Pernicious anemia -On monthly B12 supplementation.  Iron deficiency anemia due to chronic blood loss  PUD (peptic ulcer disease) -On twice daily PPI therapy. -Check CBC and iron levels today per patient request.  Obesity, morbid, BMI 40.0-49.9 ([place]) -Discussed healthy lifestyle, including increased physical activity and better food choices to promote weight loss.  Coronary artery disease involving native coronary artery of native heart without angina pectoris -Stable, no chest pain, followed by cardiology.     Patient Instructions  -Nice seeing you today!!  -Lab work today; will notify you once results are available.  -START macrobid 100 mg twice daily for 7 days for your urine infection.  -Schedule follow up in 3 months.   Urinary Tract Infection, Adult A urinary tract infection (UTI) is an infection of any part of the urinary tract. The urinary tract includes:  The kidneys.  The ureters.  The  bladder.  The urethra. These organs make, store, and get rid of pee (urine) in the body. What are the causes? This is caused by germs (bacteria) in your genital area. These germs grow and cause swelling (inflammation) of your urinary tract. What increases the risk? You are more likely to develop this condition if:  You have a small, thin tube (catheter) to drain pee.  You cannot control when you pee or poop (incontinence).  You are female, and: ? You use these methods to prevent pregnancy: ? A medicine that kills sperm (spermicide). ? A device that blocks sperm (diaphragm). ? You have low levels of a female hormone (estrogen). ? You are pregnant.  You have genes that add to your risk.  You are sexually active.  You take antibiotic medicines.  You have trouble peeing because of: ? A prostate that is bigger than normal, if you are female. ? A blockage in the part of your body that drains pee from the bladder (urethra). ? A kidney stone. ? A nerve condition that affects your bladder (neurogenic bladder). ? Not getting enough to drink. ? Not peeing often enough.  You have other conditions, such as: ? Diabetes. ? A weak disease-fighting system (immune system). ? Sickle cell disease. ? Gout. ? Injury of the spine. What are the signs or symptoms? Symptoms of this condition include:  Needing to pee right away (urgently).  Peeing often.  Peeing small amounts often.  Pain or burning when peeing.  Blood in the pee.  Pee that smells bad or not like normal.  Trouble peeing.  Pee that is cloudy.  Fluid coming from the vagina, if you are female.  Pain in the belly or lower back. Other symptoms include:  Throwing up (vomiting).  No urge to eat.  Feeling mixed up (confused).  Being tired and grouchy (irritable).  A fever.  Watery poop (diarrhea). How is this treated? This condition may be treated with:  Antibiotic medicine.  Other medicines.  Drinking  enough water. Follow these instructions at home:  Medicines  Take over-the-counter and prescription medicines only as told by your doctor.  If you were prescribed an antibiotic medicine, take it as told by your doctor. Do  not stop taking it even if you start to feel better. General instructions  Make sure you: ? Pee until your bladder is empty. ? Do not hold pee for a long time. ? Empty your bladder after sex. ? Wipe from front to back after pooping if you are a female. Use each tissue one time when you wipe.  Drink enough fluid to keep your pee pale yellow.  Keep all follow-up visits as told by your doctor. This is important. Contact a doctor if:  You do not get better after 1-2 days.  Your symptoms go away and then come back. Get help right away if:  You have very bad back pain.  You have very bad pain in your lower belly.  You have a fever.  You are sick to your stomach (nauseous).  You are throwing up. Summary  A urinary tract infection (UTI) is an infection of any part of the urinary tract.  This condition is caused by germs in your genital area.  There are many risk factors for a UTI. These include having a small, thin tube to drain pee and not being able to control when you pee or poop.  Treatment includes antibiotic medicines for germs.  Drink enough fluid to keep your pee pale yellow. This information is not intended to replace advice given to you by your health care provider. Make sure you discuss any questions you have with your health care provider. Document Released: 08/17/2007 Document Revised: 02/15/2018 Document Reviewed: 09/07/2017 Elsevier Patient Education  2020 [place], MD [facility]

## 2018-12-28 NOTE — Addendum Note (Signed)
Addended by: Elmer Picker on: 12/28/2018 11:17 AM   Modules accepted: Orders

## 2018-12-28 NOTE — Addendum Note (Signed)
Addended by: Westley Hummer B on: 12/28/2018 01:36 PM   Modules accepted: Orders

## 2018-12-28 NOTE — Patient Instructions (Signed)
-Nice seeing you today!!  -Lab work today; will notify you once results are available.  -START macrobid 100 mg twice daily for 7 days for your urine infection.  -Schedule follow up in 3 months.   Urinary Tract Infection, Adult A urinary tract infection (UTI) is an infection of any part of the urinary tract. The urinary tract includes:  The kidneys.  The ureters.  The bladder.  The urethra. These organs make, store, and get rid of pee (urine) in the body. What are the causes? This is caused by germs (bacteria) in your genital area. These germs grow and cause swelling (inflammation) of your urinary tract. What increases the risk? You are more likely to develop this condition if:  You have a small, thin tube (catheter) to drain pee.  You cannot control when you pee or poop (incontinence).  You are female, and: ? You use these methods to prevent pregnancy: ? A medicine that kills sperm (spermicide). ? A device that blocks sperm (diaphragm). ? You have low levels of a female hormone (estrogen). ? You are pregnant.  You have genes that add to your risk.  You are sexually active.  You take antibiotic medicines.  You have trouble peeing because of: ? A prostate that is bigger than normal, if you are female. ? A blockage in the part of your body that drains pee from the bladder (urethra). ? A kidney stone. ? A nerve condition that affects your bladder (neurogenic bladder). ? Not getting enough to drink. ? Not peeing often enough.  You have other conditions, such as: ? Diabetes. ? A weak disease-fighting system (immune system). ? Sickle cell disease. ? Gout. ? Injury of the spine. What are the signs or symptoms? Symptoms of this condition include:  Needing to pee right away (urgently).  Peeing often.  Peeing small amounts often.  Pain or burning when peeing.  Blood in the pee.  Pee that smells bad or not like normal.  Trouble peeing.  Pee that is cloudy.   Fluid coming from the vagina, if you are female.  Pain in the belly or lower back. Other symptoms include:  Throwing up (vomiting).  No urge to eat.  Feeling mixed up (confused).  Being tired and grouchy (irritable).  A fever.  Watery poop (diarrhea). How is this treated? This condition may be treated with:  Antibiotic medicine.  Other medicines.  Drinking enough water. Follow these instructions at home:  Medicines  Take over-the-counter and prescription medicines only as told by your doctor.  If you were prescribed an antibiotic medicine, take it as told by your doctor. Do not stop taking it even if you start to feel better. General instructions  Make sure you: ? Pee until your bladder is empty. ? Do not hold pee for a long time. ? Empty your bladder after sex. ? Wipe from front to back after pooping if you are a female. Use each tissue one time when you wipe.  Drink enough fluid to keep your pee pale yellow.  Keep all follow-up visits as told by your doctor. This is important. Contact a doctor if:  You do not get better after 1-2 days.  Your symptoms go away and then come back. Get help right away if:  You have very bad back pain.  You have very bad pain in your lower belly.  You have a fever.  You are sick to your stomach (nauseous).  You are throwing up. Summary  A urinary tract  infection (UTI) is an infection of any part of the urinary tract.  This condition is caused by germs in your genital area.  There are many risk factors for a UTI. These include having a small, thin tube to drain pee and not being able to control when you pee or poop.  Treatment includes antibiotic medicines for germs.  Drink enough fluid to keep your pee pale yellow. This information is not intended to replace advice given to you by your health care provider. Make sure you discuss any questions you have with your health care provider. Document Released: 08/17/2007  Document Revised: 02/15/2018 Document Reviewed: 09/07/2017 Elsevier Patient Education  2020 [employer].

## 2018-12-28 NOTE — Addendum Note (Signed)
Addended by: Elmer Picker on: 12/28/2018 11:16 AM   Modules accepted: Orders

## 2018-12-30 LAB — URINE CULTURE
MICRO NUMBER:: 998585
SPECIMEN QUALITY:: ADEQUATE

## 2018-12-31 LAB — ANEMIA PANEL
Ferritin: 340 ng/mL — ABNORMAL HIGH (ref 15–150)
Folate, Hemolysate: 453 ng/mL
Folate, RBC: 1100 ng/mL (ref >498)
Hematocrit: 41.2 % (ref 34.0–46.6)
Iron Saturation: 37 % (ref 15–55)
Iron: 123 ug/dL (ref 27–139)
Retic Ct Pct: 1.8 % (ref 0.6–2.6)
Total Iron Binding Capacity: 334 ug/dL (ref 250–450)
UIBC: 211 ug/dL (ref 118–369)
Vitamin B-12: 732 pg/mL (ref 232–1245)

## 2019-01-04 DIAGNOSIS — K289 Gastrojejunal ulcer, unspecified as acute or chronic, without hemorrhage or perforation: Secondary | ICD-10-CM | POA: Diagnosis not present

## 2019-01-04 DIAGNOSIS — K316 Fistula of stomach and duodenum: Secondary | ICD-10-CM | POA: Diagnosis not present

## 2019-01-04 DIAGNOSIS — Z9884 Bariatric surgery status: Secondary | ICD-10-CM | POA: Diagnosis not present

## 2019-01-09 ENCOUNTER — Other Ambulatory Visit: Payer: Self-pay | Admitting: General Surgery

## 2019-01-09 ENCOUNTER — Other Ambulatory Visit (HOSPITAL_COMMUNITY): Payer: Self-pay | Admitting: General Surgery

## 2019-01-09 ENCOUNTER — Other Ambulatory Visit: Payer: Self-pay | Admitting: Internal Medicine

## 2019-01-09 MED ORDER — CEPHALEXIN 500 MG PO CAPS: 500.0000 mg | ORAL_CAPSULE | Freq: Two times a day (BID) | ORAL | 0 refills | Status: AC

## 2019-01-14 ENCOUNTER — Telehealth: Payer: Self-pay | Admitting: Skilled Nursing Facility1

## 2019-01-14 ENCOUNTER — Encounter: Payer: Medicare Other | Attending: General Surgery | Admitting: Skilled Nursing Facility1

## 2019-01-14 ENCOUNTER — Other Ambulatory Visit: Payer: Self-pay

## 2019-01-14 DIAGNOSIS — Z713 Dietary counseling and surveillance: Secondary | ICD-10-CM | POA: Diagnosis not present

## 2019-01-14 DIAGNOSIS — Z9884 Bariatric surgery status: Secondary | ICD-10-CM | POA: Insufficient documentation

## 2019-01-14 DIAGNOSIS — E669 Obesity, unspecified: Secondary | ICD-10-CM | POA: Diagnosis not present

## 2019-01-14 NOTE — Telephone Encounter (Signed)
Opened in error

## 2019-01-14 NOTE — Progress Notes (Signed)
Pre-Operative Nutrition Class:  Appt start time: 6004   End time:  1830.  Patient was seen on 01/14/2019 for Pre-Operative Bariatric Surgery Education at [facility].   Pt arrives having not been previously seen by [facility] due to emergent need for RYGB revision d/t marginal ulcer, cause unknown. According to referral to be followed per usual process but will conduct one on one visits due to atypical bariatric surgery pt. Pt advised dietitian she was told to follow a liquid diet for 6 months post surgery as this is unlikely surgeon was asked for clarification, once clarified with surgeon dietitian to follow those appropriate recommendations.   Surgery date:  Surgery type: RYGB Revision Start weight at [facility]: 218 Weight today: 218   The following the learning objectives were met by the patient during this course:  Identify Pre-Op Dietary Goals and will begin 2 weeks pre-operatively  Identify appropriate sources of fluids and proteins   State protein recommendations and appropriate sources pre and post-operatively  Identify Post-Operative Dietary Goals and will follow for 2 weeks post-operatively  Identify appropriate multivitamin and calcium sources  Describe the need for physical activity post-operatively and will follow MD recommendations  State when to call healthcare provider regarding medication questions or post-operative complications  Handouts given during class include:  Pre-Op Bariatric Surgery Diet Handout  Protein Shake Handout  Post-Op Bariatric Surgery Nutrition Handout  BELT Program Information Flyer  Support Group Information Flyer  [facility] Bariatric Supplements Price List  Follow-Up Plan: Patient will follow-up at [facility] 2 weeks post operatively for diet advancement per MD.

## 2019-01-15 ENCOUNTER — Ambulatory Visit (HOSPITAL_COMMUNITY)
Admission: RE | Admit: 2019-01-15 | Discharge: 2019-01-15 | Disposition: A | Discharge status: Home / Self Care | Payer: Medicare Other | Source: Ambulatory Visit | Attending: General Surgery | Admitting: General Surgery

## 2019-01-15 ENCOUNTER — Encounter (HOSPITAL_COMMUNITY): Payer: Self-pay

## 2019-01-15 ENCOUNTER — Other Ambulatory Visit: Payer: Self-pay

## 2019-01-15 DIAGNOSIS — Z9884 Bariatric surgery status: Secondary | ICD-10-CM | POA: Diagnosis not present

## 2019-01-15 DIAGNOSIS — R1012 Left upper quadrant pain: Secondary | ICD-10-CM | POA: Diagnosis not present

## 2019-01-15 DIAGNOSIS — Z90710 Acquired absence of both cervix and uterus: Secondary | ICD-10-CM | POA: Insufficient documentation

## 2019-01-15 DIAGNOSIS — I700 Atherosclerosis of aorta: Secondary | ICD-10-CM | POA: Insufficient documentation

## 2019-01-15 DIAGNOSIS — R197 Diarrhea, unspecified: Secondary | ICD-10-CM | POA: Insufficient documentation

## 2019-01-15 DIAGNOSIS — R1110 Vomiting, unspecified: Secondary | ICD-10-CM | POA: Diagnosis not present

## 2019-01-15 DIAGNOSIS — I2510 Atherosclerotic heart disease of native coronary artery without angina pectoris: Secondary | ICD-10-CM | POA: Insufficient documentation

## 2019-01-15 DIAGNOSIS — R112 Nausea with vomiting, unspecified: Secondary | ICD-10-CM | POA: Insufficient documentation

## 2019-01-15 LAB — POCT I-STAT CREATININE: Creatinine, Ser: 1 mg/dL (ref 0.44–1.00)

## 2019-01-15 MED ORDER — IOHEXOL 300 MG/ML  SOLN
100.0000 mL | Freq: Once | INTRAMUSCULAR | Status: AC | PRN
  Administered 2019-01-15: 100 mL via INTRAVENOUS

## 2019-01-15 MED ORDER — SODIUM CHLORIDE (PF) 0.9 % IJ SOLN
INTRAMUSCULAR | Status: AC
  Filled 2019-01-15: qty 50

## 2019-01-17 ENCOUNTER — Other Ambulatory Visit: Payer: Self-pay

## 2019-01-17 ENCOUNTER — Other Ambulatory Visit (HOSPITAL_COMMUNITY): Payer: Self-pay | Admitting: General Surgery

## 2019-01-17 ENCOUNTER — Encounter: Payer: Self-pay | Admitting: Internal Medicine

## 2019-01-17 ENCOUNTER — Ambulatory Visit (HOSPITAL_COMMUNITY)
Admission: RE | Admit: 2019-01-17 | Discharge: 2019-01-17 | Disposition: A | Discharge status: Home / Self Care | Payer: Medicare Other | Source: Ambulatory Visit | Attending: General Surgery | Admitting: General Surgery

## 2019-01-17 DIAGNOSIS — K224 Dyskinesia of esophagus: Secondary | ICD-10-CM | POA: Diagnosis not present

## 2019-01-18 ENCOUNTER — Encounter: Payer: Self-pay | Admitting: Internal Medicine

## 2019-01-18 ENCOUNTER — Ambulatory Visit: Payer: Medicare Other | Admitting: Family Medicine

## 2019-01-18 ENCOUNTER — Ambulatory Visit: Payer: Self-pay | Admitting: General Surgery

## 2019-01-21 ENCOUNTER — Telehealth: Payer: Self-pay | Admitting: Family Medicine

## 2019-01-21 NOTE — Telephone Encounter (Signed)
Crystal with [facility] is calling and [employer] needs PA cover my  meds key [id number].

## 2019-01-22 ENCOUNTER — Telehealth (INDEPENDENT_AMBULATORY_CARE_PROVIDER_SITE_OTHER): Payer: Medicare Other | Admitting: Internal Medicine

## 2019-01-22 ENCOUNTER — Other Ambulatory Visit: Payer: Self-pay

## 2019-01-22 DIAGNOSIS — D500 Iron deficiency anemia secondary to blood loss (chronic): Secondary | ICD-10-CM | POA: Diagnosis not present

## 2019-01-22 NOTE — Progress Notes (Signed)
Virtual Visit via Video Note  I connected with Heather Christensen on 01/22/19 at  1:30 PM EST by a video enabled telemedicine application and verified that I am speaking with the correct person using two identifiers.  Location patient: home Location provider: work office Persons participating in the virtual visit: patient, provider  I discussed the limitations of evaluation and management by telemedicine and the availability of in person appointments. The patient expressed understanding and agreed to proceed.   HPI: She was hospitalized earlier this year for a GI bleed due to PUD. Now off ASA and plavix and on Eliquis only due to h/o a fib. 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 The main reason she has scheduled this visit is because she had a situation at work a few months back where she was fired and was wondering whether her iron deficiency anemia could have contributed and if so, could I write a note stating that. She believes she had some cognitive issues around that time that could have contributed to her issues with her employer.  ROS: Constitutional: Denies fever, chills, diaphoresis, appetite change and fatigue.  HEENT: Denies photophobia, eye pain, redness, hearing loss, ear pain, congestion, sore throat, rhinorrhea, sneezing, mouth sores, trouble swallowing, neck pain, neck stiffness and tinnitus.   Respiratory: Denies SOB, DOE, cough, chest tightness,  and wheezing.   Cardiovascular: Denies chest pain, palpitations and leg swelling.  Gastrointestinal: Denies nausea, vomiting, abdominal pain, diarrhea, constipation, blood in stool and abdominal distention.  Genitourinary: Denies dysuria, urgency, frequency, hematuria, flank pain and difficulty urinating.  Endocrine: Denies: hot or cold intolerance, sweats, changes in hair or nails, polyuria, polydipsia. Musculoskeletal: Denies myalgias, back pain, joint swelling,  arthralgias and gait problem.  Skin: Denies pallor, rash and wound.  Neurological: Denies dizziness, seizures, syncope, weakness, light-headedness, numbness and headaches.  Hematological: Denies adenopathy. Easy bruising, personal or family bleeding history  Psychiatric/Behavioral: Denies suicidal ideation, mood changes, confusion, nervousness, sleep disturbance and agitation   Past Medical History:  Diagnosis Date  . Alcohol abuse    Sober 56yrs   . Anemia    pernicious anemia  . Arthritis   . Asthma    seasonal, advair inhaler  . Complication of anesthesia    slow to wake  . Depression   . Dysrhythmia    palpitations, PVCs, SVT  . Eating disorder   . GI bleed   . Heart murmur   . Hypertension   . Hypothyroidism   . Migraines   . Thyroid disease     Past Surgical History:  Procedure Laterality Date  . BIOPSY  10/28/2018   Procedure: BIOPSY;  Surgeon: Mauri Pole, MD;  Location: [facility];  Service: Endoscopy;;  . BREAST SURGERY Right 2016   lumpectomy - begin  . CORONARY STENT INTERVENTION N/A 08/21/2018   Procedure: CORONARY STENT INTERVENTION;  Surgeon: Nigel Mormon, MD;  Location: [facility];  Service: Cardiovascular;  Laterality: N/A;  . ESOPHAGOGASTRODUODENOSCOPY (EGD) WITH PROPOFOL N/A 10/28/2018   Procedure: ESOPHAGOGASTRODUODENOSCOPY (EGD) WITH PROPOFOL;  Surgeon: Mauri Pole, MD;  Location: [facility];  Service: Endoscopy;  Laterality: N/A;  . EYE SURGERY     cataracts  . LEFT HEART CATH AND CORONARY ANGIOGRAPHY N/A 08/21/2018   Procedure: LEFT HEART CATH AND CORONARY ANGIOGRAPHY;  Surgeon: Nigel Mormon, MD;  Location: [facility] [facility];  Service: Cardiovascular;  Laterality: N/A;  . LEG SURGERY  multiple; fell leg got caught in a step stool and the toilet in the bathroom and crushed leg  . NECK SURGERY     lypoma removed from left side up behind ear (size of hand) beign  . REVERSE SHOULDER ARTHROPLASTY Left  11/09/2017   Procedure: REVERSE LEFT SHOULDER ARTHROPLASTY;  Surgeon: Justice Britain, MD;  Location: [facility];  Service: Orthopedics;  Laterality: Left;  . SHOULDER SURGERY Left    rotator cuff, ball broke   . TONSILLECTOMY  1958    Family History  Problem Relation Age of Onset  . Arthritis Mother   . Depression Mother   . Heart disease Mother   . Hypertension Mother   . Stroke Mother   . Cervical cancer Mother   . Alcohol abuse Father   . Depression Father   . Drug abuse Father   . Heart disease Father   . Hypertension Father   . Lung cancer Father   . Parkinson's disease Sister   . COPD Sister   . Depression Sister   . Heart disease Sister   . Hypertension Sister   . Lung cancer Sister   . Heart disease Brother   . Hypertension Brother   . Brain cancer Maternal Grandmother   . Depression Maternal Grandmother   . Heart disease Maternal Grandfather   . Alcohol abuse Paternal Grandmother   . Depression Paternal Grandmother   . GI Bleed Paternal Grandmother        bleeding ulcer  . Esophageal cancer Neg Hx   . Colon cancer Neg Hx   . Stomach cancer Neg Hx   . Pancreatic cancer Neg Hx     SOCIAL HX:   reports that she quit smoking about 30 years ago. Her smoking use included cigarettes. She has a 10.00 pack-year smoking history. She has never used smokeless tobacco. She reports previous alcohol use. She reports that she does not use drugs.   Current Outpatient Medications:  .  albuterol (PROVENTIL HFA;VENTOLIN HFA) 108 (90 Base) MCG/ACT inhaler, Inhale 1-2 puffs into the lungs every 6 (six) hours as needed for wheezing or shortness of breath., Disp: 1 Inhaler, Rfl: 0 .  amLODipine (NORVASC) 10 MG tablet, Take 10 mg by mouth daily after supper., Disp: , Rfl:  .  apixaban (ELIQUIS) 5 MG TABS tablet, Take 1 tablet (5 mg total) by mouth 2 (two) times daily. Restart on 8/22, Disp: , Rfl:  .  atorvastatin (LIPITOR) 10 MG tablet, TAKE 1 TABLET BY MOUTH EVERY DAY, Disp: 90 tablet,  Rfl: 1 .  Cholecalciferol (VITAMIN D) 50 MCG (2000 UT) CAPS, Take 1 capsule by mouth daily., Disp: , Rfl:  .  citalopram (CELEXA) 40 MG tablet, Take 1 tablet (40 mg total) by mouth at bedtime., Disp: 90 tablet, Rfl: 3 .  cyanocobalamin (,VITAMIN B-12,) 1000 MCG/ML injection, 1000 mcg (1 mg) injection once per month. (Patient taking differently: Inject 1,000 mcg into the muscle every Wednesday. ), Disp: 6 mL, Rfl: 0 .  fluticasone (FLONASE) 50 MCG/ACT nasal spray, Place 2 sprays into both nostrils daily. (Patient taking differently: Place 2 sprays into both nostrils daily as needed (seasonal allergies). ), Disp: 16 g, Rfl: 6 .  Fluticasone-Salmeterol (ADVAIR) 250-50 MCG/DOSE AEPB, Inhale 1 puff into the lungs 2 (two) times daily as needed (shortness of breath/seasonal allergies). , Disp: , Rfl:  .  Gabapentin Enacarbil ER (HORIZANT) 300 MG TBCR, Take 300 mg by mouth at bedtime., Disp: 30 tablet, Rfl: 3 .  isosorbide mononitrate (IMDUR)  30 MG 24 hr tablet, TAKE 1 TABLET(30 MG) BY MOUTH DAILY, Disp: 90 tablet, Rfl: 1 .  levothyroxine (SYNTHROID) 75 MCG tablet, Take 1 tablet (75 mcg total) by mouth daily before breakfast., Disp: 90 tablet, Rfl: 1 .  MELATONIN PO, Take 1 tablet by mouth at bedtime as needed (sleep)., Disp: , Rfl:  .  metoprolol succinate (TOPROL-XL) 50 MG 24 hr tablet, TAKE 1 TABLET BY MOUTH  DAILY IN THE EVENING (Patient taking differently: Take 50 mg by mouth daily after supper. ), Disp: 90 tablet, Rfl: 3 .  Multiple Vitamin (MULTIVITAMIN WITH MINERALS) TABS tablet, Take 1 tablet by mouth daily., Disp: , Rfl:  .  nitroGLYCERIN (NITROSTAT) 0.4 MG SL tablet, Place 1 tablet (0.4 mg total) under the tongue every 5 (five) minutes as needed for chest pain., Disp: 25 tablet, Rfl: 2 .  pantoprazole (PROTONIX) 40 MG tablet, Take 1 tablet (40 mg total) by mouth 2 (two) times daily., Disp: 180 tablet, Rfl: 0 .  spironolactone (ALDACTONE) 25 MG tablet, Take 1 tablet (25 mg total) by mouth daily.,  Disp: 30 tablet, Rfl: 2  EXAM:   VITALS per patient if applicable: none reported  GENERAL: alert, oriented, appears well and in no acute distress  HEENT: atraumatic, conjunttiva clear, no obvious abnormalities on inspection of external nose and ears  NECK: normal movements of the head and neck  LUNGS: on inspection no signs of respiratory distress, breathing rate appears normal, no obvious gross increased work of breathing, gasping or wheezing  CV: no obvious cyanosis  MS: moves all visible extremities without noticeable abnormality  PSYCH/NEURO: pleasant and cooperative, no obvious depression or anxiety, speech and thought processing grossly intact  ASSESSMENT AND PLAN:   Iron deficiency anemia due to chronic blood loss -Hb and iron levels looked good when last checked on 10/16. But due for update this week with pre-op labs. -No further melena/hematochezia. -Do not believe iron deficiency anemia to be cause of work-related incident.    I discussed the assessment and treatment plan with the patient. The patient was provided an opportunity to ask questions and all were answered. The patient agreed with the plan and demonstrated an understanding of the instructions.   The patient was advised to call back or seek an in-person evaluation if the symptoms worsen or if the condition fails to improve as anticipated.    Lelon Frohlich, MD  [facility] at [facility]

## 2019-01-22 NOTE — Patient Instructions (Addendum)
DUE TO COVID-19 ONLY ONE VISITOR IS ALLOWED TO COME WITH YOU AND STAY IN THE WAITING ROOM ONLY DURING PRE OP AND PROCEDURE DAY OF SURGERY. THE 1 VISITOR MAY VISIT WITH YOU AFTER SURGERY IN YOUR PRIVATE ROOM DURING VISITING HOURS ONLY!  YOU NEED TO HAVE A COVID 19 TEST ON 11/13/202 @ 2:50 PM, THIS TEST MUST BE DONE BEFORE SURGERY, COME  [employer], [place] , [address].  ([place]) ONCE YOUR COVID TEST IS COMPLETED, PLEASE BEGIN THE QUARANTINE INSTRUCTIONS AS OUTLINED IN YOUR HANDOUT.                Heather Christensen    Your procedure is scheduled on: Tuesday 01/29/2019   Report to [facility] Main  Entrance    Report to Short Stay at 5:30 AM     Call this number if you have problems the morning of surgery [phone]    Remember: MORNING OF SURGERY DRINK:   DRINK 1 G2 drink BEFORE YOU LEAVE HOME, DRINK ALL OF THE  G2 DRINK AT ONE TIME.  NO SOLID FOOD AFTER 600 PM THE NIGHT BEFORE YOUR SURGERY. YOU MAY DRINK CLEAR FLUIDS. THE G2 DRINK YOU DRINK BEFORE YOU LEAVE HOME WILL BE THE LAST FLUIDS YOU DRINK BEFORE SURGERY. PLEASE DRINK BY 4:30 AM  PAIN IS EXPECTED AFTER SURGERY AND WILL NOT BE COMPLETELY ELIMINATED. AMBULATION AND TYLENOL WILL HELP REDUCE INCISIONAL AND GAS PAIN. MOVEMENT IS KEY!  YOU ARE EXPECTED TO BE OUT OF BED WITHIN 4 HOURS OF ADMISSION TO YOUR PATIENT ROOM.  SITTING IN THE RECLINER THROUGHOUT THE DAY IS IMPORTANT FOR DRINKING FLUIDS AND MOVING GAS THROUGHOUT THE GI TRACT.  COMPRESSION STOCKINGS SHOULD BE WORN [place] UNLESS YOU ARE WALKING.   INCENTIVE SPIROMETER SHOULD BE USED EVERY HOUR WHILE AWAKE TO DECREASE POST-OPERATIVE COMPLICATIONS SUCH AS PNEUMONIA.  WHEN DISCHARGED HOME, IT IS IMPORTANT TO CONTINUE TO WALK EVERY HOUR AND USE THE INCENTIVE SPIROMETER EVERY HOUR.         CLEAR LIQUID DIET   Foods Allowed                                                                     Foods Excluded  Coffee and  tea, regular and decaf                             liquids that you cannot  Plain Jell-O any favor except red or purple              see through such as: Fruit ices (not with fruit pulp)                                     milk, soups, orange juice  Iced Popsicles                                    All solid food Carbonated beverages, regular and diet  Cranberry, grape and apple juices Sports drinks like Gatorade Lightly seasoned clear broth or consume(fat free) Sugar, honey syrup  Sample Menu Breakfast                                Lunch                                     Supper Cranberry juice                    Beef broth                            Chicken broth Jell-O                                     Grape juice                           Apple juice Coffee or tea                        Jell-O                                      Popsicle                                                Coffee or tea                        Coffee or tea  _____________________________________________________________________       Take these medicines the morning of surgery with A SIP OF WATER: Isorbide mononitrate (Imdur), Atorvastatin (Lipitor), Levothyroxine (Synthroid), Pantoprazole (Protonix). You may use your inhaler, and nasal spray as needed.                                  You may not have any metal on your body including hair pins and              piercings     Do not wear jewelry, make-up, lotions, powders or perfumes, deodorant              Do not wear nail polish on your fingernails.  Do not shave  48 hours prior to surgery.               Do not bring valuables to the hospital. [place].  Contacts, dentures or bridgework may not be worn into surgery.  You may bring an overnight bag                  Please read over the following fact sheets you were  given: _____________________________________________________________________             [facility] -  Preparing for Surgery Before surgery, you can play an important role.  Because skin is not sterile, your skin needs to be as free of germs as possible.  You can reduce the number of germs on your skin by washing with CHG (chlorahexidine gluconate) soap before surgery.  CHG is an antiseptic cleaner which kills germs and bonds with the skin to continue killing germs even after washing. Please DO NOT use if you have an allergy to CHG or antibacterial soaps.  If your skin becomes reddened/irritated stop using the CHG and inform your nurse when you arrive at Short Stay. Do not shave (including legs and underarms) for at least 48 hours prior to the first CHG shower.  You may shave your face/neck. Please follow these instructions carefully:  1.  Shower with CHG Soap the night before surgery and the  morning of Surgery.  2.  If you choose to wash your hair, wash your hair first as usual with your  normal  shampoo.  3.  After you shampoo, rinse your hair and body thoroughly to remove the  shampoo.                            4.  Use CHG as you would any other liquid soap.  You can apply chg directly  to the skin and wash                       Gently with a scrungie or clean washcloth.  5.  Apply the CHG Soap to your body ONLY FROM THE NECK DOWN.   Do not use on face/ open                           Wound or open sores. Avoid contact with eyes, ears mouth and genitals (private parts).                       Wash face,  Genitals (private parts) with your normal soap.             6.  Wash thoroughly, paying special attention to the area where your surgery  will be performed.  7.  Thoroughly rinse your body with warm water from the neck down.  8.  DO NOT shower/wash with your normal soap after using and rinsing off  the CHG Soap.                9.  Pat yourself dry with a clean towel.            10.  Wear  clean pajamas.            11.  Place clean sheets on your bed the night of your first shower and do not  sleep with pets. Day of Surgery : Do not apply any lotions/deodorants the morning of surgery.  Please wear clean clothes to the hospital/surgery center.  FAILURE TO FOLLOW THESE INSTRUCTIONS MAY RESULT IN THE CANCELLATION OF YOUR SURGERY PATIENT SIGNATURE_________________________________  NURSE SIGNATURE__________________________________  ________________________________________________________________________   Heather Christensen  An incentive spirometer is a tool that can help keep your lungs clear and active. This tool measures how well you are filling your lungs with each breath. Taking long deep breaths may help reverse or decrease the chance of developing breathing (pulmonary) problems (especially infection) following:  A long period of time when you  are unable to move or be active. BEFORE THE PROCEDURE   If the spirometer includes an indicator to show your best effort, your nurse or respiratory therapist will set it to a desired goal.  If possible, sit up straight or lean slightly forward. Try not to slouch.  Hold the incentive spirometer in an upright position. INSTRUCTIONS FOR USE  1. Sit on the edge of your bed if possible, or sit up as far as you can in bed or on a chair. 2. Hold the incentive spirometer in an upright position. 3. Breathe out normally. 4. Place the mouthpiece in your mouth and seal your lips tightly around it. 5. Breathe in slowly and as deeply as possible, raising the piston or the ball toward the top of the column. 6. Hold your breath for 3-5 seconds or for as long as possible. Allow the piston or ball to fall to the bottom of the column. 7. Remove the mouthpiece from your mouth and breathe out normally. 8. Rest for a few seconds and repeat Steps 1 through 7 at least 10 times every 1-2 hours when you are awake. Take your time and take a few normal  breaths between deep breaths. 9. The spirometer may include an indicator to show your best effort. Use the indicator as a goal to work toward during each repetition. 10. After each set of 10 deep breaths, practice coughing to be sure your lungs are clear. If you have an incision (the cut made at the time of surgery), support your incision when coughing by placing a pillow or rolled up towels firmly against it. Once you are able to get out of bed, walk around indoors and cough well. You may stop using the incentive spirometer when instructed by your caregiver.  RISKS AND COMPLICATIONS  Take your time so you do not get dizzy or light-headed.  If you are in pain, you may need to take or ask for pain medication before doing incentive spirometry. It is harder to take a deep breath if you are having pain. AFTER USE  Rest and breathe slowly and easily.  It can be helpful to keep track of a log of your progress. Your caregiver can provide you with a simple table to help with this. If you are using the spirometer at home, follow these instructions: [place] IF:   You are having difficultly using the spirometer.  You have trouble using the spirometer as often as instructed.  Your pain medication is not giving enough relief while using the spirometer.  You develop fever of 100.5 F (38.1 C) or higher. SEEK IMMEDIATE MEDICAL CARE IF:   You cough up bloody sputum that had not been present before.  You develop fever of 102 F (38.9 C) or greater.  You develop worsening pain at or near the incision site. MAKE SURE YOU:   Understand these instructions.  Will watch your condition.  Will get help right away if you are not doing well or get worse. Document Released: 07/11/2006 Document Revised: 05/23/2011 Document Reviewed: 09/11/2006 [facility] Patient Information 2014 [place], [place].   ________________________________________________________________________

## 2019-01-23 NOTE — Progress Notes (Signed)
PCP - Estela Hernandez-Acosta  Cardiologist - [place] w/[facility]  Chest x-ray - CT abdomen pelvis w contrast 01-15-19   EKG - 11-05-18   Stress Test -  ECHO -  Cardiac Cath -   Sleep Study -  CPAP -   Fasting Blood Sugar -  Checks Blood Sugar _____ times a day  Blood Thinner Instructions: Eliquis-Pt to contact provider to see when she should hold Eliquis. Eliquis not held at PAT appointment.   Aspirin Instructions: Last Dose:  Anesthesia review:   Patient denies shortness of breath, fever, cough and chest pain at PAT appointment   Patient verbalized understanding of instructions that were given to them at the PAT appointment. Patient was also instructed that they will need to review over the PAT instructions again at home before surgery.

## 2019-01-23 NOTE — Telephone Encounter (Signed)
Prior auth submitted

## 2019-01-24 ENCOUNTER — Other Ambulatory Visit: Payer: Self-pay

## 2019-01-24 ENCOUNTER — Encounter (HOSPITAL_COMMUNITY)
Admission: RE | Admit: 2019-01-24 | Discharge: 2019-01-24 | Disposition: A | Discharge status: Home / Self Care | Payer: Medicare Other | Source: Ambulatory Visit | Attending: General Surgery | Admitting: General Surgery

## 2019-01-24 ENCOUNTER — Encounter (HOSPITAL_COMMUNITY): Payer: Self-pay

## 2019-01-24 DIAGNOSIS — Z79899 Other long term (current) drug therapy: Secondary | ICD-10-CM | POA: Insufficient documentation

## 2019-01-24 DIAGNOSIS — K316 Fistula of stomach and duodenum: Secondary | ICD-10-CM | POA: Diagnosis not present

## 2019-01-24 DIAGNOSIS — I2510 Atherosclerotic heart disease of native coronary artery without angina pectoris: Secondary | ICD-10-CM | POA: Insufficient documentation

## 2019-01-24 DIAGNOSIS — Z01812 Encounter for preprocedural laboratory examination: Secondary | ICD-10-CM | POA: Insufficient documentation

## 2019-01-24 DIAGNOSIS — I10 Essential (primary) hypertension: Secondary | ICD-10-CM | POA: Insufficient documentation

## 2019-01-24 DIAGNOSIS — Z7901 Long term (current) use of anticoagulants: Secondary | ICD-10-CM | POA: Diagnosis not present

## 2019-01-24 DIAGNOSIS — R011 Cardiac murmur, unspecified: Secondary | ICD-10-CM | POA: Diagnosis not present

## 2019-01-24 DIAGNOSIS — E039 Hypothyroidism, unspecified: Secondary | ICD-10-CM | POA: Diagnosis not present

## 2019-01-24 DIAGNOSIS — Z87891 Personal history of nicotine dependence: Secondary | ICD-10-CM | POA: Insufficient documentation

## 2019-01-24 DIAGNOSIS — I499 Cardiac arrhythmia, unspecified: Secondary | ICD-10-CM | POA: Diagnosis not present

## 2019-01-24 LAB — CBC WITH DIFFERENTIAL/PLATELET
Abs Immature Granulocytes: 0.04 10*3/uL (ref 0.00–0.07)
Basophils Absolute: 0 10*3/uL (ref 0.0–0.1)
Basophils Relative: 1 %
Eosinophils Absolute: 0.1 10*3/uL (ref 0.0–0.5)
Eosinophils Relative: 2 %
HCT: 42.7 % (ref 36.0–46.0)
Hemoglobin: 14 g/dL (ref 12.0–15.0)
Immature Granulocytes: 1 %
Lymphocytes Relative: 35 %
Lymphs Abs: 1.9 10*3/uL (ref 0.7–4.0)
MCH: 30.3 pg (ref 26.0–34.0)
MCHC: 32.8 g/dL (ref 30.0–36.0)
MCV: 92.4 fL (ref 80.0–100.0)
Monocytes Absolute: 0.6 10*3/uL (ref 0.1–1.0)
Monocytes Relative: 10 %
Neutro Abs: 2.9 10*3/uL (ref 1.7–7.7)
Neutrophils Relative %: 51 %
Platelets: 278 10*3/uL (ref 150–400)
RBC: 4.62 MIL/uL (ref 3.87–5.11)
RDW: 15.2 % (ref 11.5–15.5)
WBC: 5.6 10*3/uL (ref 4.0–10.5)
nRBC: 0 % (ref 0.0–0.2)

## 2019-01-24 LAB — COMPREHENSIVE METABOLIC PANEL
ALT: 16 U/L (ref 0–44)
AST: 21 U/L (ref 15–41)
Albumin: 4.2 g/dL (ref 3.5–5.0)
Alkaline Phosphatase: 58 U/L (ref 38–126)
Anion gap: 9 (ref 5–15)
BUN: 18 mg/dL (ref 8–23)
CO2: 23 mmol/L (ref 22–32)
Calcium: 9.2 mg/dL (ref 8.9–10.3)
Chloride: 107 mmol/L (ref 98–111)
Creatinine, Ser: 1.12 mg/dL — ABNORMAL HIGH (ref 0.44–1.00)
GFR calc Af Amer: 59 mL/min — ABNORMAL LOW (ref >60)
GFR calc non Af Amer: 51 mL/min — ABNORMAL LOW (ref >60)
Glucose, Bld: 104 mg/dL — ABNORMAL HIGH (ref 70–99)
Potassium: 4.4 mmol/L (ref 3.5–5.1)
Sodium: 139 mmol/L (ref 135–145)
Total Bilirubin: 0.6 mg/dL (ref 0.3–1.2)
Total Protein: 6.9 g/dL (ref 6.5–8.1)

## 2019-01-24 NOTE — Telephone Encounter (Signed)
Yes. Heather Christensen yo stop eliquis 2 days before surgery and resume after surgery. No ASA/plavix, given her GI bleeding.

## 2019-01-24 NOTE — Progress Notes (Signed)
At PAT appointment, Eliquis had not been held. Pt to contact provider Jeri Lager)  to verify when to hold this medication.

## 2019-01-24 NOTE — Telephone Encounter (Signed)
From patient.

## 2019-01-25 ENCOUNTER — Other Ambulatory Visit (HOSPITAL_COMMUNITY)
Admission: RE | Admit: 2019-01-25 | Discharge: 2019-01-25 | Disposition: A | Discharge status: Home / Self Care | Payer: Medicare Other | Source: Ambulatory Visit | Attending: General Surgery | Admitting: General Surgery

## 2019-01-25 DIAGNOSIS — Z20828 Contact with and (suspected) exposure to other viral communicable diseases: Secondary | ICD-10-CM | POA: Diagnosis not present

## 2019-01-25 DIAGNOSIS — Z01812 Encounter for preprocedural laboratory examination: Secondary | ICD-10-CM | POA: Insufficient documentation

## 2019-01-27 LAB — NOVEL CORONAVIRUS, NAA (HOSP ORDER, SEND-OUT TO REF LAB; TAT 18-24 HRS): SARS-CoV-2, NAA: NOT DETECTED

## 2019-01-28 MED ORDER — BUPIVACAINE LIPOSOME 1.3 % IJ SUSP
20.0000 mL | Freq: Once | INTRAMUSCULAR | Status: DC
  Filled 2019-01-28: qty 20

## 2019-01-28 NOTE — Progress Notes (Signed)
Anesthesia Chart Review   Case: [id number] Date/Time: 01/29/19 0715   Procedure: LAPAROSCOPIC REVISION OF ROUXENY WITH UPPER ENDOSCOPY (N/A )   Anesthesia type: General   Pre-op diagnosis: s/p Gastric By-Pass, Gastro Gastric Fistula   Location: [facility] ROOM 01 / [facility]   Surgeon: Kinsinger, Arta Bruce, MD      DISCUSSION:68 y.o. former smoker (10 pack years, quit 07/05/88) with h/o HTN, asthma, hypothyroidism, PAF (on Eliquis), CAD s/p PCI to proximal D1 08/21/2018, s/p gastric by-pass with gastric fistula scheduled for above procedure 01/29/2019 with Dr. Gurney Maxin.   Last seen by cardiology 11/08/2018.  Seen by Binnie Kand, NP.  Per OV note stable at this visit with 6 month follow up recommended.  She was taken off Plavix 3 months s/p PCI due to GI bleed.  Remains on Eliquis.  Per Dr. Justus Memory 01/24/2019, "Okay to stop eliquis 2 days before surgery and resume after surgery.  No ASA/plavix, given her GI bleeding."  Anticipate pt can proceed with planned procedure barring acute status change.   VS: BP (!) 143/65 (BP Location: Right Arm)   Pulse 61   Temp 36.9 C (Oral)   Resp 18   Ht 5\' 1"  (1.549 m)   Wt 101.3 kg   SpO2 97%   BMI 42.19 kg/m   PROVIDERS: Isaac Bliss, Rayford Halsted, MD is PCP   Vernell Leep, MD is Cardiologist  LABS: Labs reviewed: Acceptable for surgery. (all labs ordered are listed, but only abnormal results are displayed)  Labs Reviewed  COMPREHENSIVE METABOLIC PANEL - Abnormal; Notable for the following components:      Result Value   Glucose, Bld 104 (*)    Creatinine, Ser 1.12 (*)    GFR calc non Af Amer 51 (*)    GFR calc Af Amer 59 (*)    All other components within normal limits  CBC WITH DIFFERENTIAL/PLATELET     IMAGES:   EKG: 11/05/2018 Rate 81 bpm Normal sinus rhythm  Nonspecific T wave abnormality  CV:  Past Medical History:  Diagnosis Date  . Alcohol abuse    Sober 66yrs   . Anemia    pernicious anemia  . Arthritis   .  Asthma    seasonal, advair inhaler  . Complication of anesthesia    slow to wake  . Depression   . Dysrhythmia    palpitations, PVCs, SVT  . Eating disorder   . GI bleed   . Heart murmur   . Hypertension   . Hypothyroidism   . Migraines   . Thyroid disease     Past Surgical History:  Procedure Laterality Date  . BIOPSY  10/28/2018   Procedure: BIOPSY;  Surgeon: Mauri Pole, MD;  Location: [facility];  Service: Endoscopy;;  . BREAST SURGERY Right 2016   lumpectomy - begin  . CORONARY STENT INTERVENTION N/A 08/21/2018   Procedure: CORONARY STENT INTERVENTION;  Surgeon: Nigel Mormon, MD;  Location: [facility];  Service: Cardiovascular;  Laterality: N/A;  . ESOPHAGOGASTRODUODENOSCOPY (EGD) WITH PROPOFOL N/A 10/28/2018   Procedure: ESOPHAGOGASTRODUODENOSCOPY (EGD) WITH PROPOFOL;  Surgeon: Mauri Pole, MD;  Location: [facility];  Service: Endoscopy;  Laterality: N/A;  . EYE SURGERY     cataracts  . LEFT HEART CATH AND CORONARY ANGIOGRAPHY N/A 08/21/2018   Procedure: LEFT HEART CATH AND CORONARY ANGIOGRAPHY;  Surgeon: Nigel Mormon, MD;  Location: [facility];  Service: Cardiovascular;  Laterality: N/A;  . LEG SURGERY  multiple; fell leg got caught in a step stool and the toilet in the bathroom and crushed leg  . NECK SURGERY     lypoma removed from left side up behind ear (size of hand) beign  . REVERSE SHOULDER ARTHROPLASTY Left 11/09/2017   Procedure: REVERSE LEFT SHOULDER ARTHROPLASTY;  Surgeon: Justice Britain, MD;  Location: [facility];  Service: Orthopedics;  Laterality: Left;  . SHOULDER SURGERY Left    rotator cuff, ball broke   . TONSILLECTOMY  1958    MEDICATIONS: . albuterol (PROVENTIL HFA;VENTOLIN HFA) 108 (90 Base) MCG/ACT inhaler  . amLODipine (NORVASC) 10 MG tablet  . apixaban (ELIQUIS) 5 MG TABS tablet  . atorvastatin (LIPITOR) 10 MG tablet  . Cholecalciferol (VITAMIN D) 50 MCG (2000 UT) CAPS  . citalopram (CELEXA) 40 MG  tablet  . cyanocobalamin (,VITAMIN B-12,) 1000 MCG/ML injection  . fluticasone (FLONASE) 50 MCG/ACT nasal spray  . Fluticasone-Salmeterol (ADVAIR) 250-50 MCG/DOSE AEPB  . Gabapentin Enacarbil ER (HORIZANT) 300 MG TBCR  . isosorbide mononitrate (IMDUR) 30 MG 24 hr tablet  . levothyroxine (SYNTHROID) 75 MCG tablet  . MELATONIN PO  . metoprolol succinate (TOPROL-XL) 50 MG 24 hr tablet  . Multiple Vitamin (MULTIVITAMIN WITH MINERALS) TABS tablet  . nitroGLYCERIN (NITROSTAT) 0.4 MG SL tablet  . pantoprazole (PROTONIX) 40 MG tablet  . spironolactone (ALDACTONE) 25 MG tablet   No current facility-administered medications for this encounter.    Derrill Memo ON 01/29/2019] bupivacaine liposome (EXPAREL) 1.3 % injection 266 mg    Maia Plan [employer] Pre-Surgical Testing [phone] 01/28/19  12:09 PM

## 2019-01-28 NOTE — Telephone Encounter (Signed)
Prior auth approved

## 2019-01-28 NOTE — Anesthesia Preprocedure Evaluation (Addendum)
Anesthesia Evaluation  Patient identified by MRN, date of birth, ID band Patient awake  General Assessment Comment:Slow to wake up  Reviewed: Allergy & Precautions, NPO status , Patient's Chart, lab work & pertinent test results  Airway Mallampati: II  TM Distance: >3 FB Neck ROM: Full    Dental no notable dental hx. (+) Teeth Intact, Dental Advisory Given   Pulmonary asthma , former smoker,    Pulmonary exam normal breath sounds clear to auscultation       Cardiovascular hypertension, Pt. on home beta blockers and Pt. on medications + CAD  Normal cardiovascular exam Rhythm:Regular Rate:Normal     Neuro/Psych  Headaches,    GI/Hepatic Neg liver ROS, GERD  Medicated,  Endo/Other  Hypothyroidism Morbid obesity  Renal/GU K+ 4.4  Cr 1.12     Musculoskeletal  (+) Arthritis ,   Abdominal (+) + obese,   Peds  Hematology  (+) anemia , Hgb 14.0 Plt 278   Anesthesia Other Findings   Reproductive/Obstetrics                           Anesthesia Physical Anesthesia Plan  ASA: III  Anesthesia Plan: General   Post-op Pain Management:    Induction: Intravenous  PONV Risk Score and Plan: Treatment may vary due to age or medical condition  Airway Management Planned: Oral ETT  Additional Equipment:   Intra-op Plan:   Post-operative Plan: Extubation in OR  Informed Consent:     Dental advisory given  Plan Discussed with: CRNA  Anesthesia Plan Comments: (Eras w lidocaine )       Anesthesia Quick Evaluation

## 2019-01-29 ENCOUNTER — Inpatient Hospital Stay (HOSPITAL_COMMUNITY): Payer: Medicare Other | Admitting: Anesthesiology

## 2019-01-29 ENCOUNTER — Inpatient Hospital Stay (HOSPITAL_COMMUNITY)
Admission: RE | Admit: 2019-01-29 | Discharge: 2019-01-30 | DRG: 988 | Disposition: A | Discharge status: Home / Self Care | Payer: Medicare Other | Attending: General Surgery | Admitting: General Surgery

## 2019-01-29 ENCOUNTER — Encounter (HOSPITAL_COMMUNITY)
Admission: RE | Disposition: A | Discharge status: Home / Self Care | Payer: Self-pay | Source: Home / Self Care | Attending: General Surgery

## 2019-01-29 ENCOUNTER — Inpatient Hospital Stay (HOSPITAL_COMMUNITY): Payer: Medicare Other | Admitting: Physician Assistant

## 2019-01-29 ENCOUNTER — Encounter (HOSPITAL_COMMUNITY): Payer: Self-pay | Admitting: *Deleted

## 2019-01-29 DIAGNOSIS — K316 Fistula of stomach and duodenum: Secondary | ICD-10-CM | POA: Diagnosis not present

## 2019-01-29 DIAGNOSIS — I2510 Atherosclerotic heart disease of native coronary artery without angina pectoris: Secondary | ICD-10-CM | POA: Diagnosis not present

## 2019-01-29 DIAGNOSIS — Z79899 Other long term (current) drug therapy: Secondary | ICD-10-CM | POA: Diagnosis not present

## 2019-01-29 DIAGNOSIS — M1990 Unspecified osteoarthritis, unspecified site: Secondary | ICD-10-CM | POA: Diagnosis not present

## 2019-01-29 DIAGNOSIS — T8183XA Persistent postprocedural fistula, initial encounter: Principal | ICD-10-CM | POA: Diagnosis present

## 2019-01-29 DIAGNOSIS — J45909 Unspecified asthma, uncomplicated: Secondary | ICD-10-CM | POA: Diagnosis not present

## 2019-01-29 DIAGNOSIS — E039 Hypothyroidism, unspecified: Secondary | ICD-10-CM | POA: Diagnosis not present

## 2019-01-29 DIAGNOSIS — Z8659 Personal history of other mental and behavioral disorders: Secondary | ICD-10-CM | POA: Diagnosis not present

## 2019-01-29 DIAGNOSIS — Z882 Allergy status to sulfonamides status: Secondary | ICD-10-CM | POA: Diagnosis not present

## 2019-01-29 DIAGNOSIS — Z8261 Family history of arthritis: Secondary | ICD-10-CM

## 2019-01-29 DIAGNOSIS — Z885 Allergy status to narcotic agent status: Secondary | ICD-10-CM | POA: Diagnosis not present

## 2019-01-29 DIAGNOSIS — I480 Paroxysmal atrial fibrillation: Secondary | ICD-10-CM | POA: Diagnosis not present

## 2019-01-29 DIAGNOSIS — Z955 Presence of coronary angioplasty implant and graft: Secondary | ICD-10-CM

## 2019-01-29 DIAGNOSIS — K9189 Other postprocedural complications and disorders of digestive system: Secondary | ICD-10-CM | POA: Diagnosis not present

## 2019-01-29 DIAGNOSIS — Z79890 Hormone replacement therapy (postmenopausal): Secondary | ICD-10-CM

## 2019-01-29 DIAGNOSIS — Z9884 Bariatric surgery status: Secondary | ICD-10-CM | POA: Diagnosis not present

## 2019-01-29 DIAGNOSIS — I10 Essential (primary) hypertension: Secondary | ICD-10-CM | POA: Diagnosis present

## 2019-01-29 DIAGNOSIS — Z87891 Personal history of nicotine dependence: Secondary | ICD-10-CM

## 2019-01-29 DIAGNOSIS — Z825 Family history of asthma and other chronic lower respiratory diseases: Secondary | ICD-10-CM | POA: Diagnosis not present

## 2019-01-29 DIAGNOSIS — J4541 Moderate persistent asthma with (acute) exacerbation: Secondary | ICD-10-CM | POA: Diagnosis not present

## 2019-01-29 DIAGNOSIS — Z8249 Family history of ischemic heart disease and other diseases of the circulatory system: Secondary | ICD-10-CM

## 2019-01-29 HISTORY — PX: LAPAROSCOPIC REVISION OF ROUXENY WITH UPPER ENDOSCOPY: SHX5923

## 2019-01-29 HISTORY — DX: Angina pectoris, unspecified: I20.9

## 2019-01-29 HISTORY — DX: Atherosclerotic heart disease of native coronary artery without angina pectoris: I25.10

## 2019-01-29 LAB — TYPE AND SCREEN
ABO/RH(D): A POS
Antibody Screen: NEGATIVE

## 2019-01-29 LAB — ABO/RH: ABO/RH(D): A POS

## 2019-01-29 LAB — GLUCOSE, CAPILLARY: Glucose-Capillary: 105 mg/dL — ABNORMAL HIGH (ref 70–99)

## 2019-01-29 LAB — HEMOGLOBIN AND HEMATOCRIT, BLOOD
HCT: 43.7 % (ref 36.0–46.0)
Hemoglobin: 13.7 g/dL (ref 12.0–15.0)

## 2019-01-29 SURGERY — REVISION, GASTRIC BYPASS, ROUX-EN-Y, LAPAROSCOPIC
Anesthesia: General | Site: Abdomen

## 2019-01-29 MED ORDER — ALBUTEROL SULFATE (2.5 MG/3ML) 0.083% IN NEBU: 2.5000 mg | INHALATION_SOLUTION | Freq: Four times a day (QID) | RESPIRATORY_TRACT | Status: DC | PRN

## 2019-01-29 MED ORDER — GABAPENTIN 300 MG PO CAPS
300.0000 mg | ORAL_CAPSULE | ORAL | Status: AC
  Administered 2019-01-29: 300 mg via ORAL
  Filled 2019-01-29: qty 1

## 2019-01-29 MED ORDER — ACETAMINOPHEN 500 MG PO TABS
1000.0000 mg | ORAL_TABLET | Freq: Three times a day (TID) | ORAL | Status: DC
  Administered 2019-01-29 – 2019-01-30 (×2): 1000 mg via ORAL
  Filled 2019-01-29 (×2): qty 2

## 2019-01-29 MED ORDER — CHLORHEXIDINE GLUCONATE 4 % EX LIQD: 60.0000 mL | Freq: Once | CUTANEOUS | Status: DC

## 2019-01-29 MED ORDER — FENTANYL CITRATE (PF) 250 MCG/5ML IJ SOLN
INTRAMUSCULAR | Status: DC | PRN
  Administered 2019-01-29: 50 ug via INTRAVENOUS
  Administered 2019-01-29: 75 ug via INTRAVENOUS
  Administered 2019-01-29 (×2): 50 ug via INTRAVENOUS
  Administered 2019-01-29: 25 ug via INTRAVENOUS

## 2019-01-29 MED ORDER — PANTOPRAZOLE SODIUM 40 MG PO TBEC
40.0000 mg | DELAYED_RELEASE_TABLET | Freq: Two times a day (BID) | ORAL | Status: DC
  Administered 2019-01-29 – 2019-01-30 (×2): 40 mg via ORAL
  Filled 2019-01-29 (×2): qty 1

## 2019-01-29 MED ORDER — LEVOTHYROXINE SODIUM 75 MCG PO TABS
75.0000 ug | ORAL_TABLET | Freq: Every day | ORAL | Status: DC
  Administered 2019-01-30: 75 ug via ORAL
  Filled 2019-01-29: qty 1

## 2019-01-29 MED ORDER — APREPITANT 40 MG PO CAPS
40.0000 mg | ORAL_CAPSULE | ORAL | Status: AC
  Administered 2019-01-29: 40 mg via ORAL
  Filled 2019-01-29: qty 1

## 2019-01-29 MED ORDER — LIDOCAINE 2% (20 MG/ML) 5 ML SYRINGE
INTRAMUSCULAR | Status: DC | PRN
  Administered 2019-01-29: 100 mg via INTRAVENOUS

## 2019-01-29 MED ORDER — HYDROCODONE-ACETAMINOPHEN 7.5-325 MG PO TABS: 1.0000 | ORAL_TABLET | Freq: Once | ORAL | Status: DC | PRN

## 2019-01-29 MED ORDER — BUPIVACAINE HCL (PF) 0.25 % IJ SOLN
INTRAMUSCULAR | Status: AC
  Filled 2019-01-29: qty 30

## 2019-01-29 MED ORDER — LIDOCAINE 2% (20 MG/ML) 5 ML SYRINGE
INTRAMUSCULAR | Status: AC
  Filled 2019-01-29: qty 5

## 2019-01-29 MED ORDER — LACTATED RINGERS IV SOLN
INTRAVENOUS | Status: DC
  Administered 2019-01-29 (×2): via INTRAVENOUS

## 2019-01-29 MED ORDER — LIDOCAINE HCL 2 % IJ SOLN
INTRAMUSCULAR | Status: AC
  Filled 2019-01-29: qty 20

## 2019-01-29 MED ORDER — ACETAMINOPHEN 10 MG/ML IV SOLN: 1000.0000 mg | Freq: Once | INTRAVENOUS | Status: DC | PRN

## 2019-01-29 MED ORDER — OXYCODONE HCL 5 MG/5ML PO SOLN
5.0000 mg | Freq: Four times a day (QID) | ORAL | Status: DC | PRN
  Administered 2019-01-29: 5 mg via ORAL
  Filled 2019-01-29: qty 5

## 2019-01-29 MED ORDER — ATORVASTATIN CALCIUM 10 MG PO TABS: 10.0000 mg | ORAL_TABLET | Freq: Every day | ORAL | Status: DC

## 2019-01-29 MED ORDER — LACTATED RINGERS IR SOLN
Status: DC | PRN
  Administered 2019-01-29: 1000 mL

## 2019-01-29 MED ORDER — SCOPOLAMINE 1 MG/3DAYS TD PT72
1.0000 | MEDICATED_PATCH | TRANSDERMAL | Status: DC
  Administered 2019-01-29: 1.5 mg via TRANSDERMAL
  Filled 2019-01-29: qty 1

## 2019-01-29 MED ORDER — BUPIVACAINE LIPOSOME 1.3 % IJ SUSP
INTRAMUSCULAR | Status: DC | PRN
  Administered 2019-01-29: 20 mL

## 2019-01-29 MED ORDER — ENSURE MAX PROTEIN PO LIQD
2.0000 [oz_av] | ORAL | Status: DC
  Administered 2019-01-30 (×4): 2 [oz_av] via ORAL

## 2019-01-29 MED ORDER — SIMETHICONE 80 MG PO CHEW: 80.0000 mg | CHEWABLE_TABLET | Freq: Four times a day (QID) | ORAL | Status: DC | PRN

## 2019-01-29 MED ORDER — EPHEDRINE SULFATE-NACL 50-0.9 MG/10ML-% IV SOSY
PREFILLED_SYRINGE | INTRAVENOUS | Status: DC | PRN
  Administered 2019-01-29: 10 mg via INTRAVENOUS

## 2019-01-29 MED ORDER — LIDOCAINE 2% (20 MG/ML) 5 ML SYRINGE
INTRAMUSCULAR | Status: DC | PRN
  Administered 2019-01-29: 1.5 mg/kg/h via INTRAVENOUS

## 2019-01-29 MED ORDER — ENOXAPARIN (LOVENOX) PATIENT EDUCATION KIT
PACK | Freq: Once | Status: DC
  Filled 2019-01-29: qty 1

## 2019-01-29 MED ORDER — ACETAMINOPHEN 500 MG PO TABS
1000.0000 mg | ORAL_TABLET | ORAL | Status: AC
  Administered 2019-01-29: 1000 mg via ORAL
  Filled 2019-01-29: qty 2

## 2019-01-29 MED ORDER — MOMETASONE FURO-FORMOTEROL FUM 200-5 MCG/ACT IN AERO
2.0000 | INHALATION_SPRAY | Freq: Two times a day (BID) | RESPIRATORY_TRACT | Status: DC
  Administered 2019-01-29 – 2019-01-30 (×2): 2 via RESPIRATORY_TRACT
  Filled 2019-01-29: qty 8.8

## 2019-01-29 MED ORDER — MEPERIDINE HCL 50 MG/ML IJ SOLN: 6.2500 mg | INTRAMUSCULAR | Status: DC | PRN

## 2019-01-29 MED ORDER — NITROGLYCERIN 0.4 MG SL SUBL: 0.4000 mg | SUBLINGUAL_TABLET | SUBLINGUAL | Status: DC | PRN

## 2019-01-29 MED ORDER — MIDAZOLAM HCL 2 MG/2ML IJ SOLN
INTRAMUSCULAR | Status: AC
  Filled 2019-01-29: qty 2

## 2019-01-29 MED ORDER — BUPIVACAINE HCL 0.25 % IJ SOLN
INTRAMUSCULAR | Status: DC | PRN
  Administered 2019-01-29: 30 mL

## 2019-01-29 MED ORDER — FENTANYL CITRATE (PF) 250 MCG/5ML IJ SOLN
INTRAMUSCULAR | Status: AC
  Filled 2019-01-29: qty 5

## 2019-01-29 MED ORDER — CITALOPRAM HYDROBROMIDE 20 MG PO TABS
40.0000 mg | ORAL_TABLET | Freq: Every day | ORAL | Status: DC
  Administered 2019-01-29: 40 mg via ORAL
  Filled 2019-01-29: qty 2

## 2019-01-29 MED ORDER — FENTANYL CITRATE (PF) 100 MCG/2ML IJ SOLN
INTRAMUSCULAR | Status: AC
  Filled 2019-01-29: qty 2

## 2019-01-29 MED ORDER — KETAMINE HCL 10 MG/ML IJ SOLN
INTRAMUSCULAR | Status: DC | PRN
  Administered 2019-01-29: 50 mg via INTRAVENOUS

## 2019-01-29 MED ORDER — SODIUM CHLORIDE 0.9 % IV SOLN
2.0000 g | INTRAVENOUS | Status: AC
  Administered 2019-01-29: 2 g via INTRAVENOUS
  Filled 2019-01-29: qty 2

## 2019-01-29 MED ORDER — DEXAMETHASONE SODIUM PHOSPHATE 10 MG/ML IJ SOLN
INTRAMUSCULAR | Status: DC | PRN
  Administered 2019-01-29: 10 mg via INTRAVENOUS

## 2019-01-29 MED ORDER — GABAPENTIN ENACARBIL ER 300 MG PO TBCR: 300.0000 mg | EXTENDED_RELEASE_TABLET | Freq: Every day | ORAL | Status: DC

## 2019-01-29 MED ORDER — SUCCINYLCHOLINE CHLORIDE 200 MG/10ML IV SOSY
PREFILLED_SYRINGE | INTRAVENOUS | Status: AC
  Filled 2019-01-29: qty 10

## 2019-01-29 MED ORDER — FAMOTIDINE IN NACL 20-0.9 MG/50ML-% IV SOLN: 20.0000 mg | Freq: Two times a day (BID) | INTRAVENOUS | Status: DC

## 2019-01-29 MED ORDER — ONDANSETRON HCL 4 MG/2ML IJ SOLN
INTRAMUSCULAR | Status: AC
  Filled 2019-01-29: qty 2

## 2019-01-29 MED ORDER — ONDANSETRON HCL 4 MG/2ML IJ SOLN: 4.0000 mg | Freq: Once | INTRAMUSCULAR | Status: DC | PRN

## 2019-01-29 MED ORDER — SUGAMMADEX SODIUM 200 MG/2ML IV SOLN
INTRAVENOUS | Status: DC | PRN
  Administered 2019-01-29: 400 mg via INTRAVENOUS

## 2019-01-29 MED ORDER — ACETAMINOPHEN 160 MG/5ML PO SOLN: 1000.0000 mg | Freq: Three times a day (TID) | ORAL | Status: DC

## 2019-01-29 MED ORDER — ROCURONIUM BROMIDE 10 MG/ML (PF) SYRINGE
PREFILLED_SYRINGE | INTRAVENOUS | Status: AC
  Filled 2019-01-29: qty 10

## 2019-01-29 MED ORDER — ONDANSETRON HCL 4 MG/2ML IJ SOLN: 4.0000 mg | INTRAMUSCULAR | Status: DC | PRN

## 2019-01-29 MED ORDER — MORPHINE SULFATE (PF) 2 MG/ML IV SOLN: 1.0000 mg | INTRAVENOUS | Status: DC | PRN

## 2019-01-29 MED ORDER — DEXAMETHASONE SODIUM PHOSPHATE 4 MG/ML IJ SOLN: 4.0000 mg | INTRAMUSCULAR | Status: DC

## 2019-01-29 MED ORDER — EPHEDRINE 5 MG/ML INJ
INTRAVENOUS | Status: AC
  Filled 2019-01-29: qty 10

## 2019-01-29 MED ORDER — HEPARIN SODIUM (PORCINE) 5000 UNIT/ML IJ SOLN
5000.0000 [IU] | INTRAMUSCULAR | Status: AC
  Administered 2019-01-29: 5000 [IU] via SUBCUTANEOUS
  Filled 2019-01-29: qty 1

## 2019-01-29 MED ORDER — DEXTROSE-NACL 5-0.45 % IV SOLN
INTRAVENOUS | Status: DC
  Administered 2019-01-30: 02:00:00 via INTRAVENOUS

## 2019-01-29 MED ORDER — SUGAMMADEX SODIUM 500 MG/5ML IV SOLN
INTRAVENOUS | Status: AC
  Filled 2019-01-29: qty 5

## 2019-01-29 MED ORDER — FLUTICASONE PROPIONATE 50 MCG/ACT NA SUSP
2.0000 | Freq: Every day | NASAL | Status: DC
  Administered 2019-01-29 – 2019-01-30 (×2): 2 via NASAL
  Filled 2019-01-29: qty 16

## 2019-01-29 MED ORDER — 0.9 % SODIUM CHLORIDE (POUR BTL) OPTIME
TOPICAL | Status: DC | PRN
  Administered 2019-01-29: 1000 mL

## 2019-01-29 MED ORDER — ROCURONIUM BROMIDE 10 MG/ML (PF) SYRINGE
PREFILLED_SYRINGE | INTRAVENOUS | Status: DC | PRN
  Administered 2019-01-29: 10 mg via INTRAVENOUS
  Administered 2019-01-29: 60 mg via INTRAVENOUS

## 2019-01-29 MED ORDER — DEXAMETHASONE SODIUM PHOSPHATE 10 MG/ML IJ SOLN
INTRAMUSCULAR | Status: AC
  Filled 2019-01-29: qty 1

## 2019-01-29 MED ORDER — ISOSORBIDE MONONITRATE ER 30 MG PO TB24
30.0000 mg | ORAL_TABLET | Freq: Every day | ORAL | Status: DC
  Administered 2019-01-30: 30 mg via ORAL
  Filled 2019-01-29: qty 1

## 2019-01-29 MED ORDER — KETAMINE HCL 10 MG/ML IJ SOLN
INTRAMUSCULAR | Status: AC
  Filled 2019-01-29: qty 1

## 2019-01-29 MED ORDER — PROPOFOL 10 MG/ML IV BOLUS
INTRAVENOUS | Status: AC
  Filled 2019-01-29: qty 20

## 2019-01-29 MED ORDER — ONDANSETRON HCL 4 MG/2ML IJ SOLN
INTRAMUSCULAR | Status: DC | PRN
  Administered 2019-01-29: 4 mg via INTRAVENOUS

## 2019-01-29 MED ORDER — GABAPENTIN 100 MG PO CAPS
200.0000 mg | ORAL_CAPSULE | Freq: Two times a day (BID) | ORAL | Status: DC
  Administered 2019-01-29 – 2019-01-30 (×2): 200 mg via ORAL
  Filled 2019-01-29 (×2): qty 2

## 2019-01-29 MED ORDER — ENOXAPARIN SODIUM 30 MG/0.3ML ~~LOC~~ SOLN
30.0000 mg | Freq: Two times a day (BID) | SUBCUTANEOUS | Status: DC
  Administered 2019-01-29 – 2019-01-30 (×2): 30 mg via SUBCUTANEOUS
  Filled 2019-01-29 (×2): qty 0.3

## 2019-01-29 MED ORDER — MIDAZOLAM HCL 2 MG/2ML IJ SOLN
INTRAMUSCULAR | Status: DC | PRN
  Administered 2019-01-29: 2 mg via INTRAVENOUS

## 2019-01-29 MED ORDER — AMLODIPINE BESYLATE 10 MG PO TABS: 10.0000 mg | ORAL_TABLET | Freq: Every day | ORAL | Status: DC

## 2019-01-29 MED ORDER — PROPOFOL 10 MG/ML IV BOLUS
INTRAVENOUS | Status: DC | PRN
  Administered 2019-01-29: 130 mg via INTRAVENOUS

## 2019-01-29 MED ORDER — FENTANYL CITRATE (PF) 100 MCG/2ML IJ SOLN
25.0000 ug | INTRAMUSCULAR | Status: DC | PRN
  Administered 2019-01-29: 50 ug via INTRAVENOUS

## 2019-01-29 MED ORDER — METOPROLOL SUCCINATE ER 50 MG PO TB24: 50.0000 mg | ORAL_TABLET | Freq: Every day | ORAL | Status: DC

## 2019-01-29 MED ORDER — SPIRONOLACTONE 25 MG PO TABS
25.0000 mg | ORAL_TABLET | Freq: Every day | ORAL | Status: DC
  Administered 2019-01-30: 25 mg via ORAL
  Filled 2019-01-29: qty 1

## 2019-01-29 SURGICAL SUPPLY — 68 items
APPLIER CLIP 5 13 M/L LIGAMAX5 (MISCELLANEOUS)
APPLIER CLIP ROT 10 11.4 M/L (STAPLE)
APPLIER CLIP ROT 13.4 12 LRG (CLIP)
BENZOIN TINCTURE PRP APPL 2/3 (GAUZE/BANDAGES/DRESSINGS) ×2 IMPLANT
BLADE SURG SZ11 CARB STEEL (BLADE) ×2 IMPLANT
BNDG ADH 1X3 SHEER STRL LF (GAUZE/BANDAGES/DRESSINGS) ×2 IMPLANT
CABLE HIGH FREQUENCY MONO STRZ (ELECTRODE) IMPLANT
CHLORAPREP W/TINT 26 (MISCELLANEOUS) ×4 IMPLANT
CLIP APPLIE 5 13 M/L LIGAMAX5 (MISCELLANEOUS) IMPLANT
CLIP APPLIE ROT 10 11.4 M/L (STAPLE) IMPLANT
CLIP APPLIE ROT 13.4 12 LRG (CLIP) IMPLANT
COVER SURGICAL LIGHT HANDLE (MISCELLANEOUS) ×2 IMPLANT
COVER WAND RF STERILE (DRAPES) ×2 IMPLANT
DEVICE SUTURE ENDOST 10MM (ENDOMECHANICALS) ×2 IMPLANT
DRAIN CHANNEL 19F RND (DRAIN) IMPLANT
DRAIN PENROSE 18X1/4 LTX STRL (WOUND CARE) IMPLANT
ELECT L-HOOK LAP 45CM DISP (ELECTROSURGICAL)
ELECT PENCIL ROCKER SW 15FT (MISCELLANEOUS) ×2 IMPLANT
ELECTRODE L-HOOK LAP 45CM DISP (ELECTROSURGICAL) IMPLANT
EVACUATOR SILICONE 100CC (DRAIN) IMPLANT
GAUZE 4X4 16PLY RFD (DISPOSABLE) ×2 IMPLANT
GAUZE SPONGE 4X4 12PLY STRL (GAUZE/BANDAGES/DRESSINGS) IMPLANT
GLOVE BIOGEL PI IND STRL 7.0 (GLOVE) ×1 IMPLANT
GLOVE BIOGEL PI INDICATOR 7.0 (GLOVE) ×1
GLOVE SURG SS PI 7.0 STRL IVOR (GLOVE) ×2 IMPLANT
GOWN STRL REUS W/TWL LRG LVL3 (GOWN DISPOSABLE) ×2 IMPLANT
GOWN STRL REUS W/TWL XL LVL3 (GOWN DISPOSABLE) ×6 IMPLANT
GRASPER SUT TROCAR 14GX15 (MISCELLANEOUS) IMPLANT
HANDLE STAPLE EGIA 4 XL (STAPLE) ×2 IMPLANT
HOVERMATT SINGLE USE (MISCELLANEOUS) ×2 IMPLANT
KIT BASIN OR (CUSTOM PROCEDURE TRAY) ×2 IMPLANT
KIT GASTRIC LAVAGE 34FR ADT (SET/KITS/TRAYS/PACK) ×2 IMPLANT
KIT TURNOVER KIT A (KITS) IMPLANT
MARKER SKIN DUAL TIP RULER LAB (MISCELLANEOUS) ×2 IMPLANT
NEEDLE SPNL 22GX3.5 QUINCKE BK (NEEDLE) ×2 IMPLANT
PACK CARDIOVASCULAR III (CUSTOM PROCEDURE TRAY) ×2 IMPLANT
RELOAD ENDO STITCH 2.0 (ENDOMECHANICALS) ×10
RELOAD STAPLER BLUE 60MM (STAPLE) ×1 IMPLANT
RELOAD STAPLER GOLD 60MM (STAPLE) ×1 IMPLANT
RELOAD STAPLER GREEN 60MM (STAPLE) ×1 IMPLANT
SCISSORS LAP 5X45 EPIX DISP (ENDOMECHANICALS) ×2 IMPLANT
SET IRRIG TUBING LAPAROSCOPIC (IRRIGATION / IRRIGATOR) ×2 IMPLANT
SET TUBE SMOKE EVAC HIGH FLOW (TUBING) ×2 IMPLANT
SHEARS HARMONIC ACE PLUS 45CM (MISCELLANEOUS) ×2 IMPLANT
SLEEVE XCEL OPT CAN 5 100 (ENDOMECHANICALS) ×6 IMPLANT
SOL ANTI FOG 6CC (MISCELLANEOUS) ×1 IMPLANT
SOLUTION ANTI FOG 6CC (MISCELLANEOUS) ×1
STAPLER ECHELON LONG 60 440 (INSTRUMENTS) ×2 IMPLANT
STAPLER RELOAD BLUE 60MM (STAPLE) ×2
STAPLER RELOAD GOLD 60MM (STAPLE) ×2
STAPLER RELOAD GREEN 60MM (STAPLE) ×2
STAPLER VISISTAT 35W (STAPLE) IMPLANT
STRIP CLOSURE SKIN 1/2X4 (GAUZE/BANDAGES/DRESSINGS) ×2 IMPLANT
SUT ETHILON 2 0 PS N (SUTURE) IMPLANT
SUT MNCRL AB 4-0 PS2 18 (SUTURE) ×2 IMPLANT
SUT RELOAD ENDO STITCH 2 48X1 (ENDOMECHANICALS) ×5
SUT RELOAD ENDO STITCH 2.0 (ENDOMECHANICALS) ×5
SUT SILK 0 SH 30 (SUTURE) IMPLANT
SUT VICRYL 0 TIES 12 18 (SUTURE) IMPLANT
SUTURE RELOAD END STTCH 2 48X1 (ENDOMECHANICALS) ×5 IMPLANT
SUTURE RELOAD ENDO STITCH 2.0 (ENDOMECHANICALS) ×5 IMPLANT
SYR 20ML LL LF (SYRINGE) ×2 IMPLANT
SYR 50ML LL SCALE MARK (SYRINGE) ×2 IMPLANT
TOWEL OR 17X26 10 PK STRL BLUE (TOWEL DISPOSABLE) ×2 IMPLANT
TOWEL OR NON WOVEN STRL DISP B (DISPOSABLE) ×2 IMPLANT
TROCAR BLADELESS OPT 5 100 (ENDOMECHANICALS) ×2 IMPLANT
TROCAR XCEL 12X100 BLDLESS (ENDOMECHANICALS) ×2 IMPLANT
TUBING CONNECTING 10 (TUBING) ×2 IMPLANT

## 2019-01-29 NOTE — Progress Notes (Addendum)
PHARMACY CONSULT FOR:  Risk Assessment for Post-Discharge VTE Following Bariatric Surgery  Post-Discharge VTE Risk Assessment: This patient's probability of 30-day post-discharge VTE is increased due to the factors marked:   Female  X  Age >/=60 years    BMI >/=50 kg/m2    CHF    Dyspnea at Rest    Paraplegia   X Non-gastric-band surgery    Operation Time >/=3 hr    Return to OR     Length of Stay >/= 3 d      Hx of VTE   Hypercoagulable condition   Significant venous stasis   Predicted probability of 30-day post-discharge VTE:   Other patient-specific factors to consider: Hx Apixaban 5mg  bid for dysrhythmia, last dose 10/16   Recommendation for Discharge: Lovenox 40mg  SQ q12 x 4 weeks  Heather Christensen is a 68 y.o. female who underwent  Laparoscopic revision of Roux-En-Y bypass   Case start: 0806 Case end: 0925  Allergies  Allergen Reactions  . Sulfa Antibiotics Other (See Comments)    STEVENS JOHNSON  . Sulfamethoxazole Other (See Comments)    STEVENS JOHNSON  . Quinolones Swelling and Other (See Comments)    Facial swelling   . Codeine Nausea And Vomiting   Patient Measurements: Height: 5\' 1"  (154.9 cm) Weight: 224 lb 12.8 oz (102 kg) IBW/kg (Calculated) : 47.8 Body mass index is 42.48 kg/m.  Recent Labs    01/29/19 1025  HGB 13.7  HCT 43.7   Estimated Creatinine Clearance: 53.5 mL/min (A) (by C-G formula based on SCr of 1.12 mg/dL (H)).  Past Medical History:  Diagnosis Date  . Alcohol abuse    Sober 59yrs   . Anemia    pernicious anemia  . Anginal pain ([place])   . Arthritis   . Asthma    seasonal, advair inhaler  . Complication of anesthesia    slow to wake  . Coronary artery disease   . Depression   . Dysrhythmia    palpitations, PVCs, SVT  . Eating disorder   . GI bleed   . Heart murmur   . Hypertension   . Hypothyroidism   . Migraines   . Thyroid disease      Medications Prior to Admission  Medication Sig Dispense Refill Last  Dose  . albuterol (PROVENTIL HFA;VENTOLIN HFA) 108 (90 Base) MCG/ACT inhaler Inhale 1-2 puffs into the lungs every 6 (six) hours as needed for wheezing or shortness of breath. 1 Inhaler 0 Past Month at Unknown time  . amLODipine (NORVASC) 10 MG tablet Take 10 mg by mouth daily after supper.   01/28/2019 at Unknown time  . atorvastatin (LIPITOR) 10 MG tablet TAKE 1 TABLET BY MOUTH EVERY DAY 90 tablet 1 01/29/2019 at 0430  . Cholecalciferol (VITAMIN D) 50 MCG (2000 UT) CAPS Take 1 capsule by mouth daily.   Past Week at Unknown time  . citalopram (CELEXA) 40 MG tablet Take 1 tablet (40 mg total) by mouth at bedtime. 90 tablet 3 01/28/2019 at Unknown time  . cyanocobalamin (,VITAMIN B-12,) 1000 MCG/ML injection 1000 mcg (1 mg) injection once per month. (Patient taking differently: Inject 1,000 mcg into the muscle every Wednesday. ) 6 mL 0 Past Week at Unknown time  . fluticasone (FLONASE) 50 MCG/ACT nasal spray Place 2 sprays into both nostrils daily. (Patient taking differently: Place 2 sprays into both nostrils daily as needed (seasonal allergies). ) 16 g 6 Past Month at Unknown time  . Fluticasone-Salmeterol (ADVAIR) 250-50 MCG/DOSE AEPB  Inhale 1 puff into the lungs 2 (two) times daily as needed (shortness of breath/seasonal allergies).    Past Month at Unknown time  . Gabapentin Enacarbil ER (HORIZANT) 300 MG TBCR Take 300 mg by mouth at bedtime. 30 tablet 3 01/28/2019 at Unknown time  . isosorbide mononitrate (IMDUR) 30 MG 24 hr tablet TAKE 1 TABLET(30 MG) BY MOUTH DAILY 90 tablet 1 01/29/2019 at 0430  . levothyroxine (SYNTHROID) 75 MCG tablet Take 1 tablet (75 mcg total) by mouth daily before breakfast. 90 tablet 1 01/29/2019 at 0430  . MELATONIN PO Take 1 tablet by mouth at bedtime as needed (sleep).   01/28/2019 at Unknown time  . Multiple Vitamin (MULTIVITAMIN WITH MINERALS) TABS tablet Take 1 tablet by mouth daily.   Past Week at Unknown time  . spironolactone (ALDACTONE) 25 MG tablet Take 1  tablet (25 mg total) by mouth daily. 30 tablet 2 01/28/2019 at Unknown time  . apixaban (ELIQUIS) 5 MG TABS tablet Take 1 tablet (5 mg total) by mouth 2 (two) times daily. Restart on 8/22     . metoprolol succinate (TOPROL-XL) 50 MG 24 hr tablet TAKE 1 TABLET BY MOUTH  DAILY IN THE EVENING (Patient taking differently: Take 50 mg by mouth daily after supper. ) 90 tablet 3   . nitroGLYCERIN (NITROSTAT) 0.4 MG SL tablet Place 1 tablet (0.4 mg total) under the tongue every 5 (five) minutes as needed for chest pain. 25 tablet 2   . pantoprazole (PROTONIX) 40 MG tablet Take 1 tablet (40 mg total) by mouth 2 (two) times daily. 180 tablet 0    Minda Ditto PharmD 01/29/2019,11:30 AM

## 2019-01-29 NOTE — Anesthesia Postprocedure Evaluation (Signed)
Anesthesia Post Note  Patient: Heather Christensen  Procedure(s) Performed: LAPAROSCOPIC REVISION OF GASTRO GASTRIC FISTULA  WITH UPPER ENDOSCOPY (N/A Abdomen)     Patient location during evaluation: PACU Anesthesia Type: General Level of consciousness: awake and alert Pain management: pain level controlled Vital Signs Assessment: post-procedure vital signs reviewed and stable Respiratory status: spontaneous breathing, nonlabored ventilation, respiratory function stable and patient connected to nasal cannula oxygen Cardiovascular status: blood pressure returned to baseline and stable Postop Assessment: no apparent nausea or vomiting Anesthetic complications: no    Last Vitals:  Vitals:   01/29/19 1240 01/29/19 1335  BP: 134/73 124/60  Pulse: 66 66  Resp: 17 17  Temp: 36.7 C   SpO2: 99% 92%    Last Pain:  Vitals:   01/29/19 1240  TempSrc: Oral  PainSc:                  Barnet Glasgow

## 2019-01-29 NOTE — Transfer of Care (Signed)
Immediate Anesthesia Transfer of Care Note  Patient: Heather Christensen  Procedure(s) Performed: LAPAROSCOPIC REVISION OF GASTRO GASTRIC FISTULA  WITH UPPER ENDOSCOPY (N/A Abdomen)  Patient Location: PACU  Anesthesia Type:General  Level of Consciousness: awake and oriented  Airway & Oxygen Therapy: Patient Spontanous Breathing and Patient connected to face mask oxygen  Post-op Assessment: Report given to RN and Post -op Vital signs reviewed and stable  Post vital signs: Reviewed and stable  Last Vitals:  Vitals Value Taken Time  BP 163/99 01/29/19 0939  Temp    Pulse 66 01/29/19 0940  Resp 19 01/29/19 0940  SpO2 96 % 01/29/19 0940  Vitals shown include unvalidated device data.  Last Pain:  Vitals:   01/29/19 0603  PainSc: 3          Complications: No apparent anesthesia complications

## 2019-01-29 NOTE — Progress Notes (Signed)
Discussed post op day goals with patient including ambulation, IS, diet progression, pain, and nausea control.  BSTOP education provided including BSTOP information guide, "Guide for Pain Management after your Bariatric Procedure".  Questions answered. 

## 2019-01-29 NOTE — Op Note (Signed)
Preoperative diagnosis: gastrogastric fistula  Postoperative diagnosis: same   Procedure: laparoscopic take down of gastrogastric fistula  Surgeon: Gurney Maxin, M.D.  Asst: Chelsea COnnor  Anesthesia: general  Indications for procedure: Heather Christensen is a 68 y.o. year old female with symptoms of nausea and vomiting with findings of gastrogastric fistula.  Description of procedure: The patient was brought into the operative suite. Anesthesia was administered with General endotracheal anesthesia. WHO checklist was applied. The patient was then placed in supine position. The area was prepped and draped in the usual sterile fashion.  Next a small transverse incision was made in the left subcostal space. A 38mm trocar was used to gain access to the peritoneal cavity by optical entry technique. Pneumoperitoneum was applied with a high flow and low pressure. The laparoscope was reinserted to confirm position.  On initial visualization of the abdomen there were few filmy adhesions of the omentum up to the abdominal wall these were taken down with harmonic scalpel.  Bilateral laparoscopid TAP was performed with marcaine/exparel mix. The liver was adhered to the abdominal wall and the stomach was visualized directly below.  There is a loop of jejunum going up to the lateral aspect of the stomach.  Sharp dissection of adhesions between the anterior stomach and liver was performed to allow visualization of the entirety of the stomach.  There appeared to be a gastrojejunal anastomosis on the lateral aspect of the cardia.  There appeared to be no fundus of the stomach.  We then identified the antrum freeing up additional scar tissue away from the liver.  Antrum appeared to be in continuity with the medial body and cardia.  There was one area of scar that we thought may have been a nondivided staple line.  Portion of the lateral aspect of the antrum was freed from surrounding fat tissue allowing  visualization of the posterior stomach.  We continued along this plane to encircle the mid body of the stomach in 360 degrees.  At this time we placed a clamp across the mid body of the stomach and Dr. Kae Heller went above perform upper endoscopy to confirm the anatomy internally.  This showed that there was a patent lateral gastrojejunostomy that was just lateral to the area we will plan to divide.  Next, a green load 60 mm Echelon stapler was used to begin the division of the stomach and this was finished with a 60 mm gold Echelon stapler.  The upper endoscopy was then repeated showing a patent gastrojejunostomy that appeared to be a loop because there was no blind and up to around 20 cm on each limb.  There was no evidence of leak of the staple line or stomach.  The area was irrigated the raw area that had been dissected free had small ooze in the surgicel snow was placed with good hemostatic effect.  Next, insufflation was removed all trochars were removed skin was closed with 4-0 Monocryl subcuticular interrupted sutures.  Steri-Strips and Band-Aids were put in place for dressing.  Patient awoke from anesthesia brought to PACU in stable condition.  All counts were correct.  Findings: findings of loop gastrojejunal anastomosis on the lateral aspect of the cardia. No fundus tissue, no division of cardia and body and antrum with possible staple line in mid body. The mid portion of the stomach was divided just distal to the gastrojejunostomy with no evidence of leak.  Specimen: none  Implant: none   Blood loss: 40 ml  Local anesthesia: 50 ml  marcaine   Complications: none  Gurney Maxin, M.D. General, Bariatric, & Minimally Invasive Surgery [facility]

## 2019-01-29 NOTE — Discharge Instructions (Signed)
° ° ° °GASTRIC BYPASS/SLEEVE ° Home Care Instructions ° ° These instructions are to help you care for yourself when you go home. ° °Call: If you have any problems. °• Call [phone] and ask for the surgeon on call °• If you need immediate help, come to the ER at [facility].  °• Tell the ER staff that you are a new post-op gastric bypass or gastric sleeve patient °  °Signs and symptoms to report: • Severe vomiting or nausea °o If you cannot keep down clear liquids for longer than 1 day, call your surgeon  °• Abdominal pain that does not get better after taking your pain medication °• Fever over 100.4° F with chills °• Heart beating over 100 beats a minute °• Shortness of breath at rest °• Chest pain °•  Redness, swelling, drainage, or foul odor at incision (surgical) sites °•  If your incisions open or pull apart °• Swelling or pain in calf (lower leg) °• Diarrhea (Loose bowel movements that happen often), frequent watery, uncontrolled bowel movements °• Constipation, (no bowel movements for 3 days) if this happens: Pick one °o Milk of Magnesia, 2 tablespoons by mouth, 3 times a day for 2 days if needed °o Stop taking Milk of Magnesia once you have a bowel movement °o Call your doctor if constipation continues °Or °o Miralax  (instead of Milk of Magnesia) following the label instructions °o Stop taking Miralax once you have a bowel movement °o Call your doctor if constipation continues °• Anything you think is not normal °  °Normal side effects after surgery: • Unable to sleep at night or unable to focus °• Irritability or moody °• Being tearful (crying) or depressed °These are common complaints, possibly related to your anesthesia medications that put you to sleep, stress of surgery, and change in lifestyle.  This usually goes away a few weeks after surgery.  If these feelings continue, call your primary care doctor. °  °Wound Care: You may have surgical glue, steri-strips, or staples over your incisions after  surgery °• Surgical glue:  Looks like a clear film over your incisions and will wear off a little at a time °• Steri-strips: Strips of tape over your incisions. You may notice a yellowish color on the skin under the steri-strips. This is used to make the   steri-strips stick better. Do not pull the steri-strips off - let them fall off °• Staples: Staples may be removed before you leave the hospital °o If you go home with staples, call [facility], [phone] at for an appointment with your surgeon’s nurse to have staples removed 10 days after surgery. °• Showering: You may shower two (2) days after your surgery unless your surgeon tells you differently °o Wash gently around incisions with warm soapy water, rinse well, and gently pat dry  °o No tub baths until staples are removed, steri-strips fall off or glue is gone.  °  °Medications: • Medications should be liquid or crushed if larger than the size of a dime °• Extended release pills (medication that release a little bit at a time through the day) should NOT be crushed or cut. (examples include XL, ER, DR, SR) °• Depending on the size and number of medications you take, you may need to space (take a few throughout the day)/change the time you take your medications so that you do not over-fill your pouch (smaller stomach) °• Make sure you follow-up with your primary care doctor to   make medication changes needed during rapid weight loss and life-style changes °• If you have diabetes, follow up with the doctor that orders your diabetes medication(s) within one week after surgery and check your blood sugar regularly. °• Do not drive while taking prescription pain medication  °• It is ok to take Tylenol by the bottle instructions with your pain medicine or instead of your pain medicine as needed.  DO NOT TAKE NSAIDS (EXAMPLES OF NSAIDS:  IBUPROFREN/ NAPROXEN)  °Diet:                    First 2 Weeks ° You will see the dietician t about two (2) weeks  after your surgery. The dietician will increase the types of foods you can eat if you are handling liquids well: °• If you have severe vomiting or nausea and cannot keep down clear liquids lasting longer than 1 day, call your surgeon @ ([phone]) °Protein Shake °• Drink at least 2 ounces of shake 5-6 times per day °• Each serving of protein shakes (usually 8 - 12 ounces) should have: °o 15 grams of protein  °o And no more than 5 grams of carbohydrate  °• Goal for protein each day: °o Men = 80 grams per day °o Women = 60 grams per day °• Protein powder may be added to fluids such as non-fat milk or Lactaid milk or unsweetened Soy/Almond milk (limit to 35 grams added protein powder per serving) ° °Hydration °• Slowly increase the amount of water and other clear liquids as tolerated (See Acceptable Fluids) °• Slowly increase the amount of protein shake as tolerated  °•  Sip fluids slowly and throughout the day.  Do not use straws. °• May use sugar substitutes in small amounts (no more than 6 - 8 packets per day; i.e. Splenda) ° °Fluid Goal °• The first goal is to drink at least 8 ounces of protein shake/drink per day (or as directed by the nutritionist); some examples of protein shakes are Syntrax Nectar, Adkins Advantage, EAS Edge HP, and Unjury. See handout from pre-op Bariatric Education Class: °o Slowly increase the amount of protein shake you drink as tolerated °o You may find it easier to slowly sip shakes throughout the day °o It is important to get your proteins in first °• Your fluid goal is to drink 64 - 100 ounces of fluid daily °o It may take a few weeks to build up to this °• 32 oz (or more) should be clear liquids  °And  °• 32 oz (or more) should be full liquids (see below for examples) °• Liquids should not contain sugar, caffeine, or carbonation ° °Clear Liquids: °• Water or Sugar-free flavored water (i.e. Fruit H2O, Propel) °• Decaffeinated coffee or tea (sugar-free) °• Crystal Lite, Wyler’s Lite,  Minute Maid Lite °• Sugar-free Jell-O °• Bouillon or broth °• Sugar-free Popsicle:   *Less than 20 calories each; Limit 1 per day ° °Full Liquids: °Protein Shakes/Drinks + 2 choices per day of other full liquids °• Full liquids must be: °o No More Than 15 grams of Carbs per serving  °o No More Than 3 grams of Fat per serving °• Strained low-fat cream soup (except Cream of Potato or Tomato) °• Non-Fat milk °• Fat-free Lactaid Milk °• Unsweetened Soy Or Unsweetened Almond Milk °• Low Sugar yogurt (Dannon Lite & Fit, Greek yogurt; Oikos Triple Zero; Chobani Simply 100; Yoplait 100 calorie Greek - No Fruit on the Bottom) ° °  °Vitamins   and Minerals • Start 1 day after surgery unless otherwise directed by your surgeon °• 2 Chewable Bariatric Specific Multivitamin / Multimineral Supplement with iron (Example: Bariatric Advantage Multi EA) °• Chewable Calcium with Vitamin D-3 °(Example: 3 Chewable Calcium Plus 600 with Vitamin D-3) °o Take 500 mg three (3) times a day for a total of 1500 mg each day °o Do not take all 3 doses of calcium at one time as it may cause constipation, and you can only absorb 500 mg  at a time  °o Do not mix multivitamins containing iron with calcium supplements; take 2 hours apart °• Menstruating women and those with a history of anemia (a blood disease that causes weakness) may need extra iron °o Talk with your doctor to see if you need more iron °• Do not stop taking or change any vitamins or minerals until you talk to your dietitian or surgeon °• Your Dietitian and/or surgeon must approve all vitamin and mineral supplements °  °Activity and Exercise: Limit your physical activity as instructed by your doctor.  It is important to continue walking at home.  During this time, use these guidelines: °• Do not lift anything greater than ten (10) pounds for at least two (2) weeks °• Do not go back to work or drive until your surgeon says you can °• You may have sex when you feel comfortable  °o It is  VERY important for female patients to use a reliable birth control method; fertility often increases after surgery  °o All hormonal birth control will be ineffective for 30 days after surgery due to medications given during surgery a barrier method must be used. °o Do not get pregnant for at least 18 months °• Start exercising as soon as your doctor tells you that you can °o Make sure your doctor approves any physical activity °• Start with a simple walking program °• Walk 5-15 minutes each day, 7 days per week.  °• Slowly increase until you are walking 30-45 minutes per day °Consider joining our BELT program. [phone] or email [email or web address] °  °Special Instructions Things to remember: °• Use your CPAP when sleeping if this applies to you ° °• [facility] has two free Bariatric Surgery Support Groups that meet monthly °o The 3rd Thursday of each month, 6 pm, [facility]  °o The 2nd Friday of each month, 11:45 am in the private dining room in the basement of  °• It is very important to keep all follow up appointments with your surgeon, dietitian, primary care physician, and behavioral health practitioner °• Routine follow up schedule with your surgeon include appointments at 2-3 weeks, 6-8 weeks, 6 months, and 1 year at a minimum.  Your surgeon may request to see you more often.   °o After the first year, please follow up with your bariatric surgeon and dietitian at least once a year in order to maintain best weight loss results °[facility]: [phone] °[facility]: [phone] °Bariatric Nurse Coordinator: [phone] °  °   Reviewed and Endorsed  °by [facility] Patient Education Committee, June, 2016 °Edits Approved: Aug, 2018 ° ° ° °

## 2019-01-29 NOTE — Anesthesia Procedure Notes (Signed)
Procedure Name: Intubation Date/Time: 01/29/2019 7:34 AM Performed by: Sharlette Dense, CRNA Patient Re-evaluated:Patient Re-evaluated prior to induction Oxygen Delivery Method: Circle system utilized Preoxygenation: Pre-oxygenation with 100% oxygen Induction Type: IV induction Ventilation: Mask ventilation without difficulty and Oral airway inserted - appropriate to patient size Laryngoscope Size: Miller and 2 Grade View: Grade I Tube size: 7.5 mm Number of attempts: 1 Airway Equipment and Method: Stylet Placement Confirmation: ETT inserted through vocal cords under direct vision,  positive ETCO2 and breath sounds checked- equal and bilateral Secured at: 21 cm Tube secured with: Tape Dental Injury: Teeth and Oropharynx as per pre-operative assessment

## 2019-01-29 NOTE — H&P (Signed)
Heather Christensen is an 68 y.o. female.   Chief Complaint: gastrogastric fistula HPI: 68 yo female with pain, nausea and vomiting and distant history of gastric bypass  Past Medical History:  Diagnosis Date  . Alcohol abuse    Sober 70yrs   . Anemia    pernicious anemia  . Arthritis   . Asthma    seasonal, advair inhaler  . Complication of anesthesia    slow to wake  . Depression   . Dysrhythmia    palpitations, PVCs, SVT  . Eating disorder   . GI bleed   . Heart murmur   . Hypertension   . Hypothyroidism   . Migraines   . Thyroid disease     Past Surgical History:  Procedure Laterality Date  . BIOPSY  10/28/2018   Procedure: BIOPSY;  Surgeon: Mauri Pole, MD;  Location: [facility];  Service: Endoscopy;;  . BREAST SURGERY Right 2016   lumpectomy - begin  . CORONARY STENT INTERVENTION N/A 08/21/2018   Procedure: CORONARY STENT INTERVENTION;  Surgeon: Nigel Mormon, MD;  Location: [facility];  Service: Cardiovascular;  Laterality: N/A;  . ESOPHAGOGASTRODUODENOSCOPY (EGD) WITH PROPOFOL N/A 10/28/2018   Procedure: ESOPHAGOGASTRODUODENOSCOPY (EGD) WITH PROPOFOL;  Surgeon: Mauri Pole, MD;  Location: [facility];  Service: Endoscopy;  Laterality: N/A;  . EYE SURGERY     cataracts  . LEFT HEART CATH AND CORONARY ANGIOGRAPHY N/A 08/21/2018   Procedure: LEFT HEART CATH AND CORONARY ANGIOGRAPHY;  Surgeon: Nigel Mormon, MD;  Location: [facility];  Service: Cardiovascular;  Laterality: N/A;  . LEG SURGERY     multiple; fell leg got caught in a step stool and the toilet in the bathroom and crushed leg  . NECK SURGERY     lypoma removed from left side up behind ear (size of hand) beign  . REVERSE SHOULDER ARTHROPLASTY Left 11/09/2017   Procedure: REVERSE LEFT SHOULDER ARTHROPLASTY;  Surgeon: Justice Britain, MD;  Location: [facility];  Service: Orthopedics;  Laterality: Left;  . SHOULDER SURGERY Left    rotator cuff, ball broke   . TONSILLECTOMY   1958    Family History  Problem Relation Age of Onset  . Arthritis Mother   . Depression Mother   . Heart disease Mother   . Hypertension Mother   . Stroke Mother   . Cervical cancer Mother   . Alcohol abuse Father   . Depression Father   . Drug abuse Father   . Heart disease Father   . Hypertension Father   . Lung cancer Father   . Parkinson's disease Sister   . COPD Sister   . Depression Sister   . Heart disease Sister   . Hypertension Sister   . Lung cancer Sister   . Heart disease Brother   . Hypertension Brother   . Brain cancer Maternal Grandmother   . Depression Maternal Grandmother   . Heart disease Maternal Grandfather   . Alcohol abuse Paternal Grandmother   . Depression Paternal Grandmother   . GI Bleed Paternal Grandmother        bleeding ulcer  . Esophageal cancer Neg Hx   . Colon cancer Neg Hx   . Stomach cancer Neg Hx   . Pancreatic cancer Neg Hx    Social History:  reports that she quit smoking about 30 years ago. Her smoking use included cigarettes. She has a 10.00 pack-year smoking history. She has never used smokeless tobacco. She reports previous  alcohol use. She reports that she does not use drugs.  Allergies:  Allergies  Allergen Reactions  . Sulfa Antibiotics Other (See Comments)    STEVENS JOHNSON  . Sulfamethoxazole Other (See Comments)    STEVENS JOHNSON  . Quinolones Swelling and Other (See Comments)    Facial swelling   . Codeine Nausea And Vomiting    Medications Prior to Admission  Medication Sig Dispense Refill  . albuterol (PROVENTIL HFA;VENTOLIN HFA) 108 (90 Base) MCG/ACT inhaler Inhale 1-2 puffs into the lungs every 6 (six) hours as needed for wheezing or shortness of breath. 1 Inhaler 0  . amLODipine (NORVASC) 10 MG tablet Take 10 mg by mouth daily after supper.    Marland Kitchen atorvastatin (LIPITOR) 10 MG tablet TAKE 1 TABLET BY MOUTH EVERY DAY 90 tablet 1  . Cholecalciferol (VITAMIN D) 50 MCG (2000 UT) CAPS Take 1 capsule by mouth  daily.    . citalopram (CELEXA) 40 MG tablet Take 1 tablet (40 mg total) by mouth at bedtime. 90 tablet 3  . cyanocobalamin (,VITAMIN B-12,) 1000 MCG/ML injection 1000 mcg (1 mg) injection once per month. (Patient taking differently: Inject 1,000 mcg into the muscle every Wednesday. ) 6 mL 0  . fluticasone (FLONASE) 50 MCG/ACT nasal spray Place 2 sprays into both nostrils daily. (Patient taking differently: Place 2 sprays into both nostrils daily as needed (seasonal allergies). ) 16 g 6  . Fluticasone-Salmeterol (ADVAIR) 250-50 MCG/DOSE AEPB Inhale 1 puff into the lungs 2 (two) times daily as needed (shortness of breath/seasonal allergies).     . Gabapentin Enacarbil ER (HORIZANT) 300 MG TBCR Take 300 mg by mouth at bedtime. 30 tablet 3  . isosorbide mononitrate (IMDUR) 30 MG 24 hr tablet TAKE 1 TABLET(30 MG) BY MOUTH DAILY 90 tablet 1  . levothyroxine (SYNTHROID) 75 MCG tablet Take 1 tablet (75 mcg total) by mouth daily before breakfast. 90 tablet 1  . MELATONIN PO Take 1 tablet by mouth at bedtime as needed (sleep).    . Multiple Vitamin (MULTIVITAMIN WITH MINERALS) TABS tablet Take 1 tablet by mouth daily.    Marland Kitchen spironolactone (ALDACTONE) 25 MG tablet Take 1 tablet (25 mg total) by mouth daily. 30 tablet 2  . apixaban (ELIQUIS) 5 MG TABS tablet Take 1 tablet (5 mg total) by mouth 2 (two) times daily. Restart on 8/22    . metoprolol succinate (TOPROL-XL) 50 MG 24 hr tablet TAKE 1 TABLET BY MOUTH  DAILY IN THE EVENING (Patient taking differently: Take 50 mg by mouth daily after supper. ) 90 tablet 3  . nitroGLYCERIN (NITROSTAT) 0.4 MG SL tablet Place 1 tablet (0.4 mg total) under the tongue every 5 (five) minutes as needed for chest pain. 25 tablet 2  . pantoprazole (PROTONIX) 40 MG tablet Take 1 tablet (40 mg total) by mouth 2 (two) times daily. 180 tablet 0    Results for orders placed or performed during the hospital encounter of 01/29/19 (from the past 48 hour(s))  Glucose, capillary      Status: Abnormal   Collection Time: 01/29/19  6:02 AM  Result Value Ref Range   Glucose-Capillary 105 (H) 70 - 99 mg/dL   No results found.  Review of Systems  Constitutional: Negative for chills and fever.  HENT: Negative for hearing loss.   Eyes: Negative for blurred vision and double vision.  Respiratory: Negative for cough and hemoptysis.   Cardiovascular: Negative for chest pain and palpitations.  Gastrointestinal: Positive for abdominal pain and nausea. Negative for  vomiting.  Genitourinary: Negative for dysuria and urgency.  Musculoskeletal: Negative for myalgias and neck pain.  Skin: Negative for itching and rash.  Neurological: Negative for dizziness, tingling and headaches.  Endo/Heme/Allergies: Does not bruise/bleed easily.  Psychiatric/Behavioral: Negative for depression and suicidal ideas.    Blood pressure 139/64, pulse (!) 59, temperature (!) 97.5 F (36.4 C), resp. rate 13, height 5\' 1"  (1.549 m), weight 102 kg, SpO2 93 %. Physical Exam  Vitals reviewed. Constitutional: She is oriented to person, place, and time. She appears well-developed and well-nourished.  HENT:  Head: Normocephalic and atraumatic.  Eyes: Pupils are equal, round, and reactive to light. Conjunctivae and EOM are normal.  Neck: Normal range of motion. Neck supple.  Cardiovascular: Normal rate and regular rhythm.  Respiratory: Effort normal and breath sounds normal.  GI: Soft. Bowel sounds are normal. She exhibits no distension. There is no abdominal tenderness.  Musculoskeletal: Normal range of motion.  Neurological: She is alert and oriented to person, place, and time.  Skin: Skin is warm and dry.  Psychiatric: She has a normal mood and affect. Her behavior is normal.     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

## 2019-01-29 NOTE — Op Note (Signed)
Preoperative diagnosis: gastrogastric fistula in setting of prior gastric bypass  Postoperative diagnosis: revised gastric pouch with patent loop gastrojejunostomy   Procedure: Upper endoscopy   Surgeon: Clovis Riley, M.D.  Anesthesia: Gen.   Description of procedure: The endoscopy was placed in the mouth and into the oropharynx and under endoscopic vision it was advanced to the esophagogastric junction.  The pouch was tensely insufflated while the upper abdomen was flooded with irrigation to perform a leak test, which was negative. No bubbles were seen. The staple line is hemostatic. The revised gastric pouch is an appropriately small, even tube without any retained fundus. The prior gastrojejunostomy is patent and both limbs cannulated, this appears to be a loop gastrojejunostomy. The lumen was decompressed and the scope was withdrawn without difficulty.    Clovis Riley, M.D. General, Bariatric, & Minimally Invasive Surgery [facility]

## 2019-01-30 ENCOUNTER — Encounter (HOSPITAL_COMMUNITY): Payer: Self-pay | Admitting: General Surgery

## 2019-01-30 LAB — COMPREHENSIVE METABOLIC PANEL
ALT: 40 U/L (ref 0–44)
AST: 42 U/L — ABNORMAL HIGH (ref 15–41)
Albumin: 3.8 g/dL (ref 3.5–5.0)
Alkaline Phosphatase: 49 U/L (ref 38–126)
Anion gap: 8 (ref 5–15)
BUN: 24 mg/dL — ABNORMAL HIGH (ref 8–23)
CO2: 26 mmol/L (ref 22–32)
Calcium: 9 mg/dL (ref 8.9–10.3)
Chloride: 106 mmol/L (ref 98–111)
Creatinine, Ser: 0.93 mg/dL (ref 0.44–1.00)
GFR calc Af Amer: >60 mL/min (ref >60)
GFR calc non Af Amer: >60 mL/min (ref >60)
Glucose, Bld: 145 mg/dL — ABNORMAL HIGH (ref 70–99)
Potassium: 4.3 mmol/L (ref 3.5–5.1)
Sodium: 140 mmol/L (ref 135–145)
Total Bilirubin: 0.4 mg/dL (ref 0.3–1.2)
Total Protein: 6.3 g/dL — ABNORMAL LOW (ref 6.5–8.1)

## 2019-01-30 LAB — CBC WITH DIFFERENTIAL/PLATELET
Abs Immature Granulocytes: 0.04 10*3/uL (ref 0.00–0.07)
Basophils Absolute: 0 10*3/uL (ref 0.0–0.1)
Basophils Relative: 0 %
Eosinophils Absolute: 0 10*3/uL (ref 0.0–0.5)
Eosinophils Relative: 0 %
HCT: 39.3 % (ref 36.0–46.0)
Hemoglobin: 12.4 g/dL (ref 12.0–15.0)
Immature Granulocytes: 0 %
Lymphocytes Relative: 11 %
Lymphs Abs: 1 10*3/uL (ref 0.7–4.0)
MCH: 29.4 pg (ref 26.0–34.0)
MCHC: 31.6 g/dL (ref 30.0–36.0)
MCV: 93.1 fL (ref 80.0–100.0)
Monocytes Absolute: 0.6 10*3/uL (ref 0.1–1.0)
Monocytes Relative: 7 %
Neutro Abs: 7.7 10*3/uL (ref 1.7–7.7)
Neutrophils Relative %: 82 %
Platelets: 228 10*3/uL (ref 150–400)
RBC: 4.22 MIL/uL (ref 3.87–5.11)
RDW: 15 % (ref 11.5–15.5)
WBC: 9.3 10*3/uL (ref 4.0–10.5)
nRBC: 0 % (ref 0.0–0.2)

## 2019-01-30 MED ORDER — ONDANSETRON 4 MG PO TBDP: 4.0000 mg | ORAL_TABLET | Freq: Four times a day (QID) | ORAL | 0 refills | Status: AC | PRN

## 2019-01-30 MED ORDER — ACETAMINOPHEN 500 MG PO TABS: 1000.0000 mg | ORAL_TABLET | Freq: Three times a day (TID) | ORAL | 0 refills | Status: AC

## 2019-01-30 NOTE — Progress Notes (Signed)
Patient alert and oriented, pain is controlled. Patient is tolerating fluids, advanced to protein shake today, patient is tolerating well. Reviewed  discharge instructions with patient and patient is able to articulate understanding. Provided information on BELT program, Support Group and [facility] outpatient pharmacy. All questions answered, will continue to monitor.   Total fluid intake 780 Per dehydration protocol call back one week post op

## 2019-01-30 NOTE — Discharge Summary (Signed)
Physician Discharge Summary  Heather Christensen [id number] DOB: 1951/03/02 DOA: 01/29/2019  PCP: Isaac Bliss, Rayford Halsted, MD  Admit date: 01/29/2019 Discharge date: 01/30/2019  Recommendations for Outpatient Follow-up:  1.  (include homehealth, outpatient follow-up instructions, specific recommendations for PCP to follow-up on, etc.)  Follow-up Information    Elber Galyean, Arta Bruce, MD. Go on 02/22/2019.   Specialty: General Surgery Why: at [place] information: [place] [address] [phone]        Ada Holness, Arta Bruce, MD .   Specialty: General Surgery Contact information: [place] [address] [phone]          Discharge Diagnoses:  Active Problems:   Gastrogastric fistula   Surgical Procedure: laparoscopic revision of gastric bypass, upper endoscopy  Discharge Condition: Good Disposition: Home  Diet recommendation: Postoperative sleeve gastrectomy diet (liquids only)  Filed Weights   01/29/19 0605  Weight: 102 kg     Hospital Course:  The patient was admitted after undergoing laparoscopic revision of gastric bypass. POD 0 she ambulated well. POD 1 she was started on the water diet protocol and tolerated 500 ml in the first shift. Once meeting the water amount she was advanced to bariatric protein shakes which they tolerated and were discharged home POD 1.  Treatments: surgery: laparoscopic revision of gastric bypass  Discharge Instructions  Discharge Instructions    Ambulate hourly while awake   Complete by: As directed    Call MD for:  difficulty breathing, headache or visual disturbances   Complete by: As directed    Call MD for:  persistant dizziness or light-headedness   Complete by: As directed    Call MD for:  persistant nausea and vomiting   Complete by: As directed    Call MD for:  redness, tenderness, or signs of infection (pain, swelling, redness, odor or green/yellow  discharge around incision site)   Complete by: As directed    Call MD for:  severe uncontrolled pain   Complete by: As directed    Call MD for:  temperature >101 F   Complete by: As directed    Diet bariatric full liquid   Complete by: As directed    Discharge wound care:   Complete by: As directed    Remove Bandaids tomorrow, ok to shower tomorrow. Steristrips may fall off in 1-3 weeks.   Incentive spirometry   Complete by: As directed    Perform hourly while awake     Allergies as of 01/30/2019      Reactions   Sulfa Antibiotics Other (See Comments)   STEVENS JOHNSON   Sulfamethoxazole Other (See Comments)   STEVENS JOHNSON   Quinolones Swelling, Other (See Comments)   Facial swelling   Codeine Nausea And Vomiting      Medication List    TAKE these medications   acetaminophen 500 MG tablet Commonly known as: TYLENOL Take 2 tablets (1,000 mg total) by mouth every 8 (eight) hours for 5 days.   albuterol 108 (90 Base) MCG/ACT inhaler Commonly known as: VENTOLIN HFA Inhale 1-2 puffs into the lungs every 6 (six) hours as needed for wheezing or shortness of breath.   amLODipine 10 MG tablet Commonly known as: NORVASC Take 10 mg by mouth daily after supper. Notes to patient: Monitor Blood Pressure Daily and keep a log for primary care physician.  You may need to make changes to your medications with rapid weight loss.     apixaban  5 MG Tabs tablet Commonly known as: Eliquis Take 1 tablet (5 mg total) by mouth 2 (two) times daily. Restart on 8/22   atorvastatin 10 MG tablet Commonly known as: LIPITOR TAKE 1 TABLET BY MOUTH EVERY DAY   citalopram 40 MG tablet Commonly known as: CELEXA Take 1 tablet (40 mg total) by mouth at bedtime.   cyanocobalamin 1000 MCG/ML injection Commonly known as: (VITAMIN B-12) 1000 mcg (1 mg) injection once per month. What changed:   how much to take  how to take this  when to take this  additional instructions   fluticasone 50  MCG/ACT nasal spray Commonly known as: FLONASE Place 2 sprays into both nostrils daily. What changed:   when to take this  reasons to take this   Fluticasone-Salmeterol 250-50 MCG/DOSE Aepb Commonly known as: ADVAIR Inhale 1 puff into the lungs 2 (two) times daily as needed (shortness of breath/seasonal allergies).   Horizant 300 MG Tbcr Generic drug: Gabapentin Enacarbil ER Take 300 mg by mouth at bedtime.   isosorbide mononitrate 30 MG 24 hr tablet Commonly known as: IMDUR TAKE 1 TABLET(30 MG) BY MOUTH DAILY What changed:   how to take this  when to take this  additional instructions   levothyroxine 75 MCG tablet Commonly known as: SYNTHROID Take 1 tablet (75 mcg total) by mouth daily before breakfast.   MELATONIN PO Take 1 tablet by mouth at bedtime as needed (sleep).   metoprolol succinate 50 MG 24 hr tablet Commonly known as: TOPROL-XL TAKE 1 TABLET BY MOUTH  DAILY IN THE EVENING What changed: when to take this   multivitamin with minerals Tabs tablet Take 1 tablet by mouth daily.   nitroGLYCERIN 0.4 MG SL tablet Commonly known as: NITROSTAT Place 1 tablet (0.4 mg total) under the tongue every 5 (five) minutes as needed for chest pain.   ondansetron 4 MG disintegrating tablet Commonly known as: ZOFRAN-ODT Take 1 tablet (4 mg total) by mouth every 6 (six) hours as needed for nausea or vomiting.   pantoprazole 40 MG tablet Commonly known as: Protonix Take 1 tablet (40 mg total) by mouth 2 (two) times daily.   spironolactone 25 MG tablet Commonly known as: ALDACTONE Take 1 tablet (25 mg total) by mouth daily. Notes to patient: Monitor Blood Pressure Daily and keep a log for primary care physician.  Monitor for symptoms of dehydration.  You may need to make changes to your medications with rapid weight loss.     Vitamin D 50 MCG (2000 UT) Caps Take 1 capsule by mouth daily.            Discharge Care Instructions  (From admission, onward)          Start     Ordered   01/30/19 0000  Discharge wound care:    Comments: Remove Bandaids tomorrow, ok to shower tomorrow. Steristrips may fall off in 1-3 weeks.   01/30/19 0914         Follow-up Information    Austina Constantin, Arta Bruce, MD. Go on 02/22/2019.   Specialty: General Surgery Why: at [place] information: [place] [address] [phone]        Keric Zehren, Arta Bruce, MD .   Specialty: General Surgery Contact information: [place] [address] [phone]            The results of significant diagnostics from this hospitalization (including imaging, microbiology, ancillary and laboratory) are listed below  for reference.    Significant Diagnostic Studies: Ct Abdomen Pelvis W Contrast  Result Date: 01/15/2019 CLINICAL DATA:  Internal bleeding, ulcers, left upper quadrant pain, nausea, vomiting, loose stools EXAM: CT ABDOMEN AND PELVIS WITH CONTRAST TECHNIQUE: Multidetector CT imaging of the abdomen and pelvis was performed using the standard protocol following bolus administration of intravenous contrast. CONTRAST:  123mL OMNIPAQUE IOHEXOL 300 MG/ML SOLN, additional oral enteric contrast COMPARISON:  None. FINDINGS: Lower chest: No acute abnormality. Dependent bibasilar scarring of the lung bases. Left coronary artery calcifications and/or stents. Hepatobiliary: No solid liver abnormality is seen. No gallstones, gallbladder wall thickening, or biliary dilatation. Pancreas: Unremarkable. No pancreatic ductal dilatation or surrounding inflammatory changes. Spleen: Normal in size without significant abnormality. Adrenals/Urinary Tract: Adrenal glands are unremarkable. Kidneys are normal, without renal calculi, solid lesion, or hydronephrosis. Bladder is unremarkable. Stomach/Bowel: Postoperative findings of Roux-en-Y gastric bypass. Appendix appears normal. No evidence of bowel wall thickening, distention, or inflammatory  changes. Vascular/Lymphatic: Aortic atherosclerosis. No enlarged abdominal or pelvic lymph nodes. Reproductive: Status post hysterectomy. Other: No abdominal wall hernia or abnormality. No abdominopelvic ascites. Musculoskeletal: No acute or significant osseous findings. IMPRESSION: 1. No acute CT findings of the abdomen or pelvis to explain pain, nausea, vomiting, or loose stools. 2. Postoperative findings of Roux-en-Y gastric bypass. Consider endoscopy to assess for ulceration or other abnormality given reported history. 3. No evidence of GI hemorrhage or other internal bleeding on this non tailored examination which includes oral enteric contrast. 4.  Status post hysterectomy. 5.  Coronary artery disease.  Aortic Atherosclerosis (ICD10-I70.0). Electronically Signed   By: Eddie Candle M.D.   On: 01/15/2019 09:54   Dg Duanne Limerick W Single Cm (sol Or Thin Ba)  Result Date: 01/17/2019 CLINICAL DATA:  History of gastric bypass procedure, history of marginal ulceration with bleeding ulcers discovered on recent hospital admission. Patient reports being told of gastrogastric fistula. EXAM: UPPER GI SERIES WITH KUB TECHNIQUE: After obtaining a scout radiograph a routine upper GI series was performed using thin barium FLUOROSCOPY TIME:  Fluoroscopy Time:  4 minutes 54 seconds Radiation Exposure Index (if provided by the fluoroscopic device): 70.7 mGy Number of Acquired Spot Images: 1 COMPARISON:  CT exam of 01/15/2019 FINDINGS: Ferrel Logan of thin barium were utilized. Contrast flowed readily from the esophagus into the ?excluded? stomach and duodenum. Contrast progressed over the course of the exam into the jejunum, ligament of Treitz to the left of midline. Contrast only filled the Roux limb later in the examination after positioning the patient into prone LAO position at allowing gravity to fill this surgical anatomy. Initial assessment of the esophagus suggested there was some mild narrowing distally just above the pouch. This  was not however found to be the case following ingestion of a barium tablet which showed prompt passage into the excluded stomach. Esophageal motility was assessed in a limited fashion showing proximal escape of 20-30% of the ingested material into the proximal esophagus during swallowing and signs of moderate tertiary peristaltic activity Some mild irregularity of the mucosa was present suggesting some ulceration particularly in the gastric antrum. Stomach was not well distended for this evaluation. IMPRESSION: 1. Signs of gastrogastric fistula. 2. Preferential filling of the "excluded stomach" during the examination. 3. Possible mild ulceration in the area of the gastric antrum though mucosal assessment was limited. 4. Flow into the Roux limb of ingested contrast only with prone LAO positioning. i.e. with gravity assistance. 5. Esophageal dysmotility. . Electronically Signed   By: Jewel Baize.D.  On: 01/17/2019 11:40    Labs: Basic Metabolic Panel: Recent Labs  Lab 01/24/19 1038 01/30/19 0234  NA 139 140  K 4.4 4.3  CL 107 106  CO2 23 26  GLUCOSE 104* 145*  BUN 18 24*  CREATININE 1.12* 0.93  CALCIUM 9.2 9.0   Liver Function Tests: Recent Labs  Lab 01/24/19 1038 01/30/19 0234  AST 21 42*  ALT 16 40  ALKPHOS 58 49  BILITOT 0.6 0.4  PROT 6.9 6.3*  ALBUMIN 4.2 3.8    CBC: Recent Labs  Lab 01/24/19 1038 01/29/19 1025 01/30/19 0234  WBC 5.6  --  9.3  NEUTROABS 2.9  --  7.7  HGB 14.0 13.7 12.4  HCT 42.7 43.7 39.3  MCV 92.4  --  93.1  PLT 278  --  228    CBG: Recent Labs  Lab 01/29/19 0602  GLUCAP 105*    Active Problems:   Gastrogastric fistula   VTE plan: she will restart her eliquis tomorrow ([email or web address])  Time coordinating discharge: 15 min

## 2019-01-30 NOTE — Progress Notes (Signed)
Discharge instructions given to pt and all questions were answered.  

## 2019-02-04 ENCOUNTER — Telehealth (HOSPITAL_COMMUNITY): Payer: Self-pay

## 2019-02-04 ENCOUNTER — Other Ambulatory Visit: Payer: Self-pay

## 2019-02-04 MED ORDER — PANTOPRAZOLE SODIUM 40 MG PO TBEC: 40.0000 mg | DELAYED_RELEASE_TABLET | Freq: Two times a day (BID) | ORAL | 0 refills | Status: AC

## 2019-02-04 NOTE — Telephone Encounter (Signed)
Patient called to discuss post bariatric surgery follow up questions.  See below:   1.  Tell me about your pain and pain management?denies  2.  Let's talk about fluid intake.  How much total fluid are you taking in?64 ounces  3.  How much protein have you taken in the last 2 days?60 grams  4.  Have you had nausea?  Tell me about when have experienced nausea and what you did to help?denies  5.  Has the frequency or color changed with your urine?light in color  6.  Tell me what your incisions look like?no problem  7.  Have you been passing gas? BM?had bm since home  8.  If a problem or question were to arise who would you call?  Do you know contact numbers for [facility], [facility], and [facility]?aware of how to contact all services  9.  How has the walking going?walking around  10.  How are your vitamins and calcium going?  How are you taking them?mvi and calcium

## 2019-02-05 ENCOUNTER — Other Ambulatory Visit: Payer: Self-pay | Admitting: *Deleted

## 2019-02-05 ENCOUNTER — Ambulatory Visit: Payer: Medicare Other | Admitting: Internal Medicine

## 2019-02-05 NOTE — Telephone Encounter (Signed)
Please read

## 2019-02-06 ENCOUNTER — Other Ambulatory Visit: Payer: Self-pay | Admitting: *Deleted

## 2019-02-06 NOTE — Patient Outreach (Signed)
Unsuccesssful outreach 02/05/19. Will call tomorrow.  Heather Christensen. Myrtie Neither, MSN, [facility] Gerontological Nurse Practitioner [facility] Care Management [phone]

## 2019-02-06 NOTE — Patient Outreach (Signed)
2nd telephone outreach for follow up on [facility] call.  Unable to reach pt, but able to leave a message and request a return call.  Heather Christensen. Myrtie Neither, MSN, [facility] Gerontological Nurse Practitioner [facility] Care Management [phone]

## 2019-02-06 NOTE — Patient Outreach (Signed)
Unsuccessful outreach on 11/24. Will call again on 02/06/19.  Eulah Pont. Myrtie Neither, MSN, [facility] Gerontological Nurse Practitioner [facility] Care Management [phone]

## 2019-02-11 ENCOUNTER — Other Ambulatory Visit: Payer: Self-pay

## 2019-02-11 ENCOUNTER — Encounter: Payer: Medicare Other | Admitting: Dietician

## 2019-02-11 DIAGNOSIS — E669 Obesity, unspecified: Secondary | ICD-10-CM

## 2019-02-11 DIAGNOSIS — Z713 Dietary counseling and surveillance: Secondary | ICD-10-CM | POA: Diagnosis not present

## 2019-02-11 DIAGNOSIS — Z9884 Bariatric surgery status: Secondary | ICD-10-CM | POA: Diagnosis not present

## 2019-02-11 NOTE — Progress Notes (Signed)
2 Week Post-Operative Nutrition Education Bariatric Nutrition Education  Start Time: 2:05pm   End Time: 2:35pm  Patient was seen on 02/11/2019 for 1-on-1 post-operative nutrition education at [facility]   Surgery date: 01/29/2019 Surgery type: s/p RYGB Fistula Repair   Start weight at [facility]: 218 lbs (date: 01/14/2019) Weight today: 214.8 lbs Weight change: -3.2 lbs  The following the learning objectives were met by the patient during this course:  Identifies Phase 3 (Soft, High Protein Foods) Dietary Goals and will begin from 2 weeks post-operatively to 2 months post-operatively  Identifies appropriate sources of fluids and proteins   States protein recommendations and appropriate sources post-operatively  Identifies the need for appropriate texture modifications, mastication, and bite sizes when consuming solids  Identifies appropriate multivitamin and calcium sources post-operatively  Describes the need for physical activity post-operatively and will follow MD recommendations  States when to call healthcare provider regarding medication questions or post-operative complications  Handouts given include:  Phase 3: Soft, High Protein Diet  Phase 3 Meal Ideas  Follow-Up Plan: Patient is to follow-up at [facility] in 6 weeks for 2 month post-op nutrition visit for diet advancement per MD. Patient states she is moving soon and plans to transfer to another program for follow up.

## 2019-02-12 ENCOUNTER — Encounter: Payer: Self-pay | Admitting: *Deleted

## 2019-02-12 ENCOUNTER — Other Ambulatory Visit: Payer: Self-pay | Admitting: *Deleted

## 2019-02-12 NOTE — Patient Outreach (Signed)
Telephone outreach for [facility].  I was able to talk with Ms. Sande today. She reports she is doing VERY WELL. She has had her follow up and a nutritional consult yesterday. She has all her meds and future follow up appt.  She denies any care management needs at this time. Advised will send [place] Management information for her future reference.  Eulah Pont. Myrtie Neither, MSN, [facility] Gerontological Nurse Practitioner [facility] Care Management [phone]

## 2019-02-18 ENCOUNTER — Encounter: Payer: Self-pay | Admitting: Internal Medicine

## 2019-03-14 ENCOUNTER — Encounter: Payer: Self-pay | Admitting: Gastroenterology

## 2019-04-08 DIAGNOSIS — D170 Benign lipomatous neoplasm of skin and subcutaneous tissue of head, face and neck: Secondary | ICD-10-CM | POA: Diagnosis not present

## 2019-04-19 DIAGNOSIS — D170 Benign lipomatous neoplasm of skin and subcutaneous tissue of head, face and neck: Secondary | ICD-10-CM | POA: Diagnosis not present

## 2019-05-10 ENCOUNTER — Ambulatory Visit: Payer: Medicare Other | Admitting: Cardiology

## 2019-05-31 ENCOUNTER — Other Ambulatory Visit: Payer: Self-pay | Admitting: Cardiology

## 2019-06-24 DIAGNOSIS — S42291A Other displaced fracture of upper end of right humerus, initial encounter for closed fracture: Secondary | ICD-10-CM | POA: Diagnosis not present

## 2019-06-24 DIAGNOSIS — M25511 Pain in right shoulder: Secondary | ICD-10-CM | POA: Diagnosis not present

## 2019-06-28 ENCOUNTER — Other Ambulatory Visit: Payer: Self-pay

## 2019-06-28 DIAGNOSIS — I480 Paroxysmal atrial fibrillation: Secondary | ICD-10-CM

## 2019-06-28 DIAGNOSIS — S42291A Other displaced fracture of upper end of right humerus, initial encounter for closed fracture: Secondary | ICD-10-CM | POA: Diagnosis not present

## 2019-06-28 MED ORDER — ISOSORBIDE MONONITRATE ER 30 MG PO TB24: ORAL_TABLET | ORAL | 0 refills | Status: DC

## 2019-06-28 MED ORDER — ATORVASTATIN CALCIUM 10 MG PO TABS: 10.0000 mg | ORAL_TABLET | Freq: Every day | ORAL | 0 refills | Status: AC

## 2019-06-28 MED ORDER — AMLODIPINE BESYLATE 10 MG PO TABS: 10.0000 mg | ORAL_TABLET | Freq: Every day | ORAL | 0 refills | Status: DC

## 2019-07-03 DIAGNOSIS — M25511 Pain in right shoulder: Secondary | ICD-10-CM | POA: Diagnosis not present

## 2019-07-03 DIAGNOSIS — M25311 Other instability, right shoulder: Secondary | ICD-10-CM | POA: Diagnosis not present

## 2019-07-10 ENCOUNTER — Other Ambulatory Visit: Payer: Self-pay | Admitting: Cardiology

## 2019-07-10 DIAGNOSIS — M65872 Other synovitis and tenosynovitis, left ankle and foot: Secondary | ICD-10-CM | POA: Diagnosis not present

## 2019-07-10 DIAGNOSIS — M25572 Pain in left ankle and joints of left foot: Secondary | ICD-10-CM | POA: Diagnosis not present

## 2019-08-17 ENCOUNTER — Other Ambulatory Visit: Payer: Self-pay | Admitting: Cardiology

## 2019-08-26 IMAGING — DX DG TIBIA/FIBULA 2V*L*
4 series · 4 of 4 positions shown · non-contrast
Comparison: None.

CLINICAL DATA: LEFT LOWER leg pain following injury 3 weeks ago.
Initial encounter. History of remote tibial and fibular fractures.

EXAM:
LEFT TIBIA AND FIBULA - 2 VIEW

[tibia ap (1 of 2)]
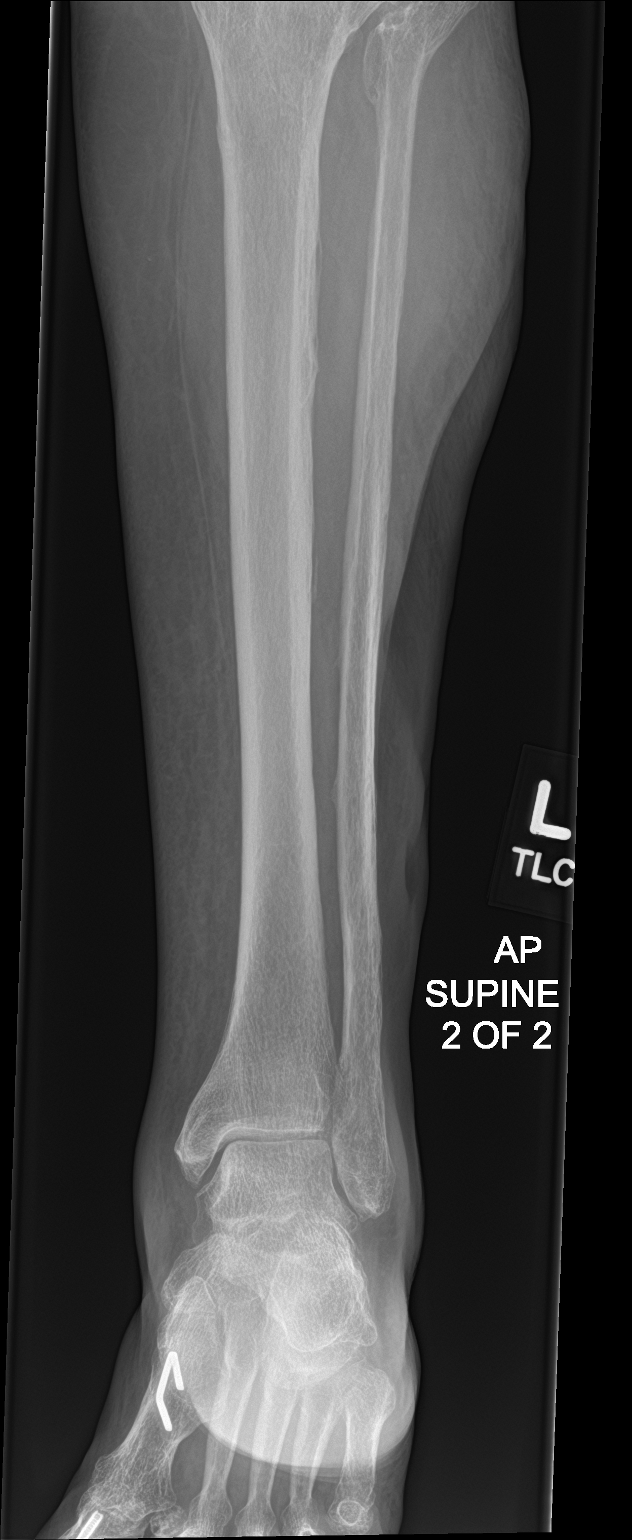

[tibia ap (2 of 2)]
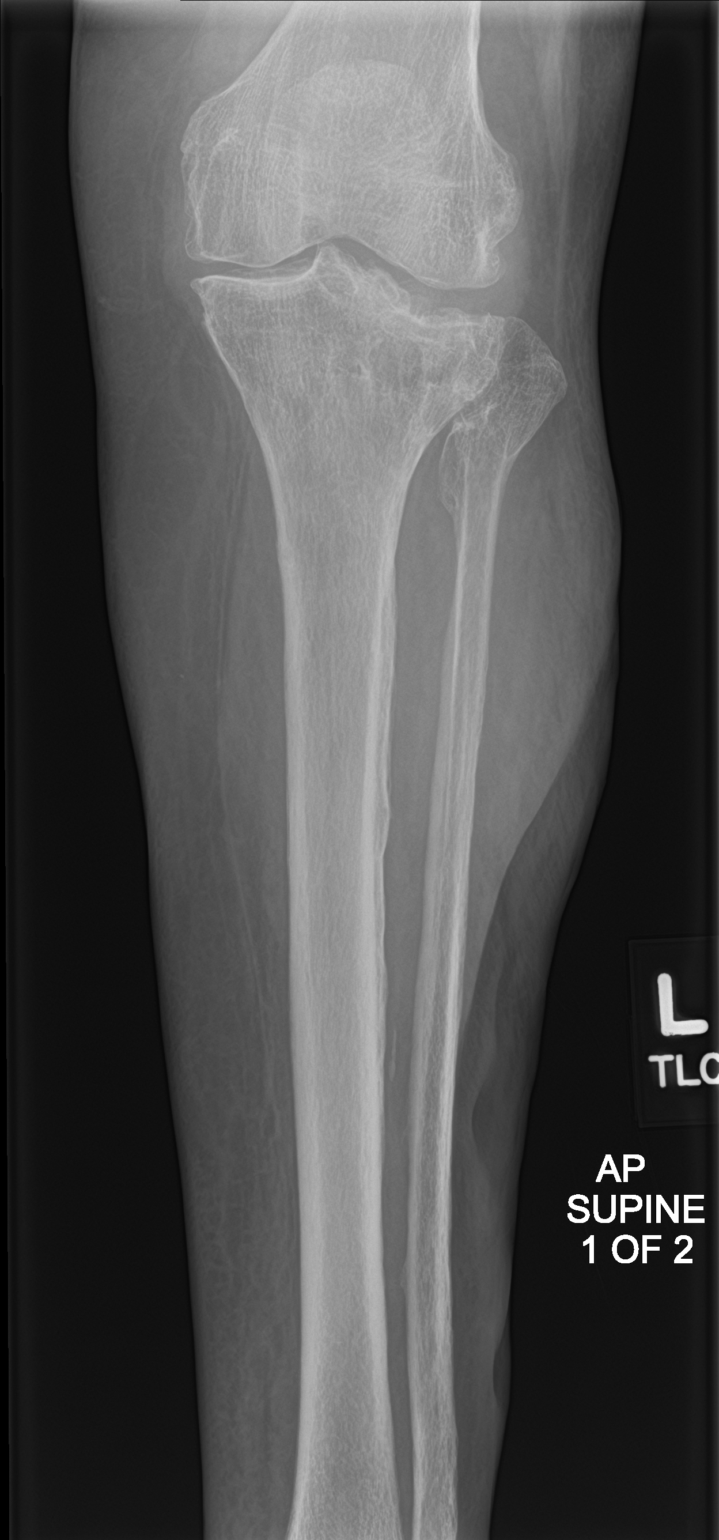

[tibia lat (1 of 2)]
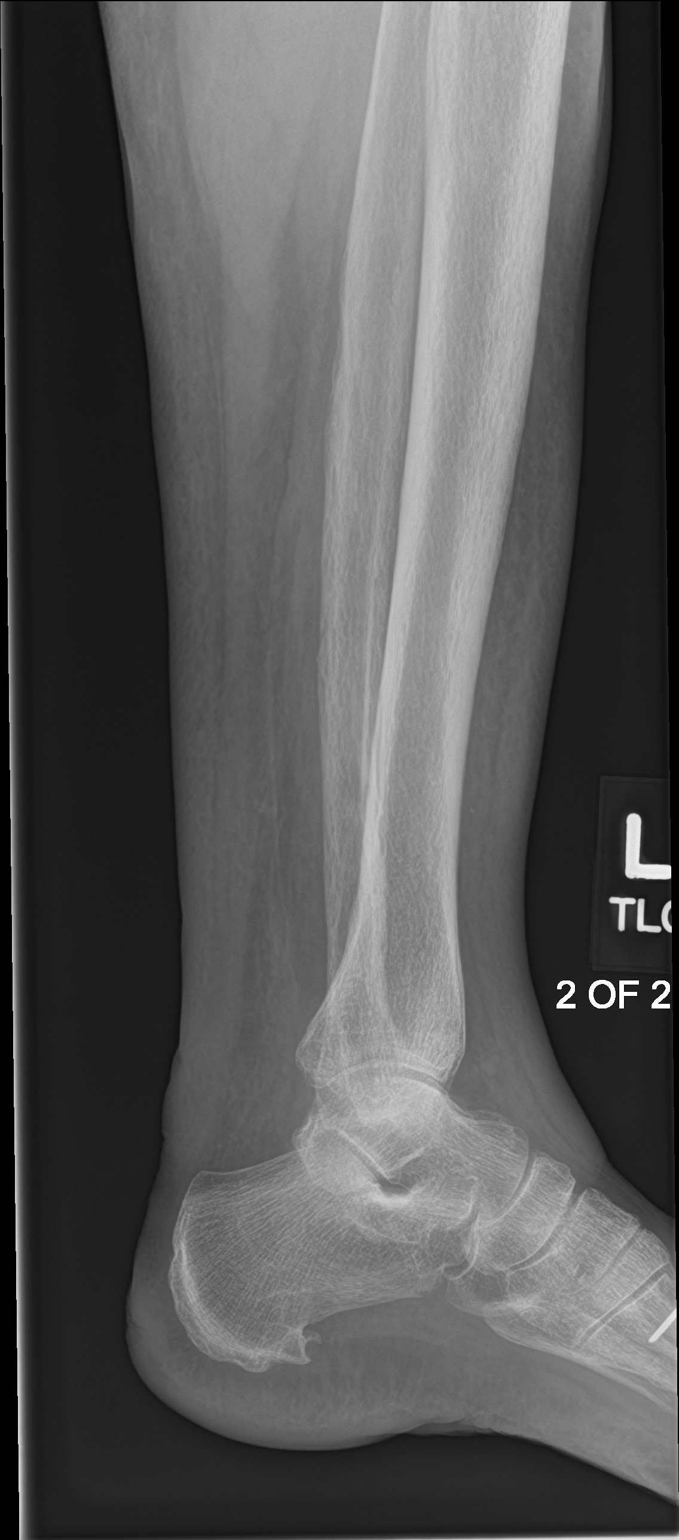

[tibia lat (2 of 2)]
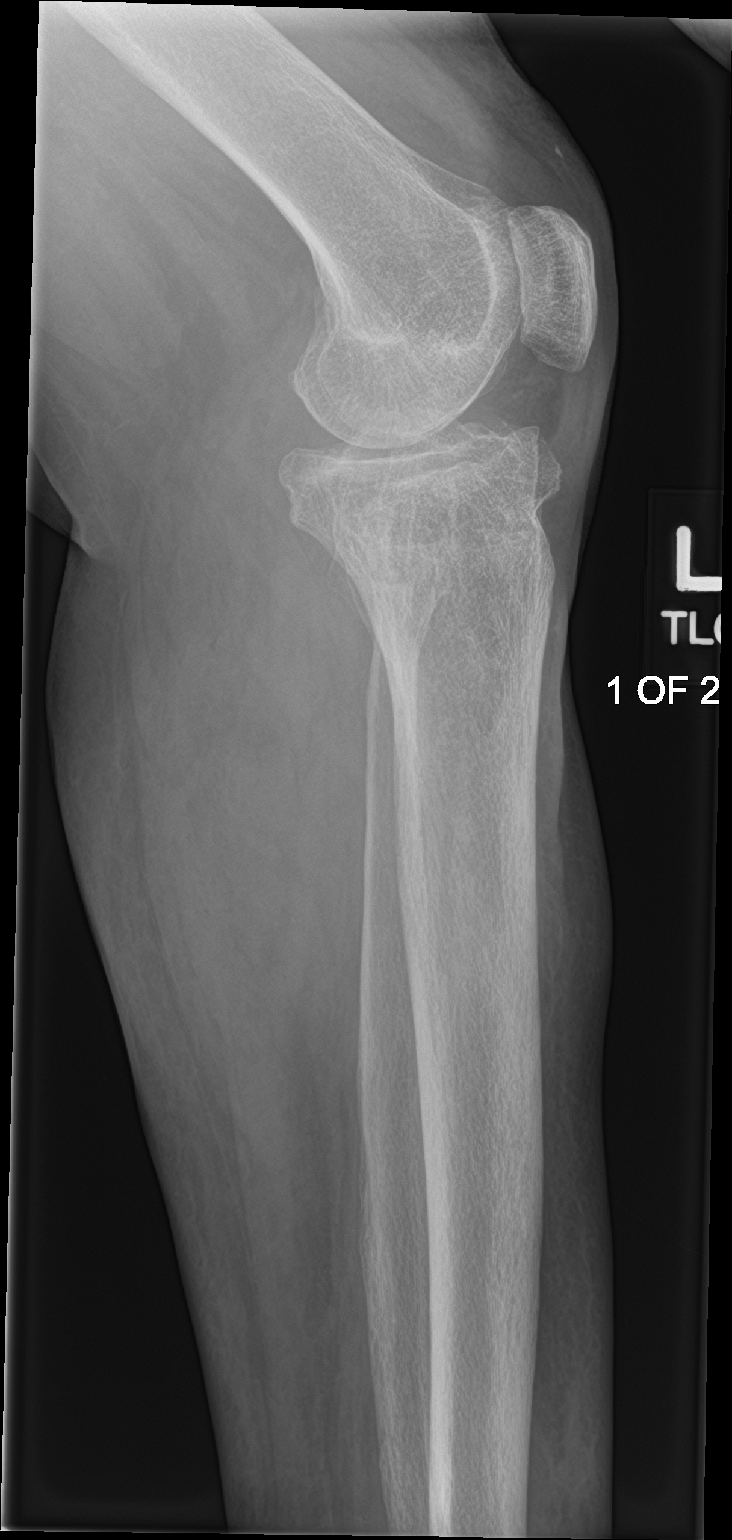

[4 of 4 positions shown; findings below may reference images not displayed]

FINDINGS: No acute fracture, subluxation or dislocation identified.

Chronic posttraumatic changes of the proximal tibia and fibula
noted.

Degenerative changes within the MEDIAL and LATERAL compartments
noted.

No knee effusion.
IMPRESSION: No acute bony abnormality

Remote fractures of the proximal tibia and fibula.

## 2019-08-29 ENCOUNTER — Other Ambulatory Visit: Payer: Self-pay | Admitting: Cardiology

## 2019-09-02 ENCOUNTER — Other Ambulatory Visit: Payer: Self-pay | Admitting: Cardiology

## 2019-09-02 DIAGNOSIS — I480 Paroxysmal atrial fibrillation: Secondary | ICD-10-CM

## 2020-01-06 ENCOUNTER — Other Ambulatory Visit: Payer: Self-pay | Admitting: Family Medicine

## 2020-01-06 DIAGNOSIS — F341 Dysthymic disorder: Secondary | ICD-10-CM

## 2020-07-17 ENCOUNTER — Other Ambulatory Visit: Payer: Self-pay | Admitting: Cardiology

## 2020-10-18 ENCOUNTER — Other Ambulatory Visit: Payer: Self-pay | Admitting: Cardiology

## 2021-01-06 IMAGING — RF DG UGI W SINGLE CM
11 of 18 series · 12 of 24 positions shown · non-contrast
Comparison: CT exam of 01/15/2019

CLINICAL DATA: History of gastric bypass procedure, history of
marginal ulceration with bleeding ulcers discovered on recent
hospital admission. Patient reports being told of gastrogastric
fistula.

EXAM:
UPPER GI SERIES WITH KUB
TECHNIQUE: After obtaining a scout radiograph a routine upper GI series was
performed using thin barium
FLUOROSCOPY TIME:  Fluoroscopy Time:  4 minutes 54 seconds
Radiation Exposure Index (if provided by the fluoroscopic device):
70.7 mGy
Number of Acquired Spot Images: 1

[Series 2: t abdomen supine · 0.15mm/px · 1 of 1 slices shown]
[im 1/1]
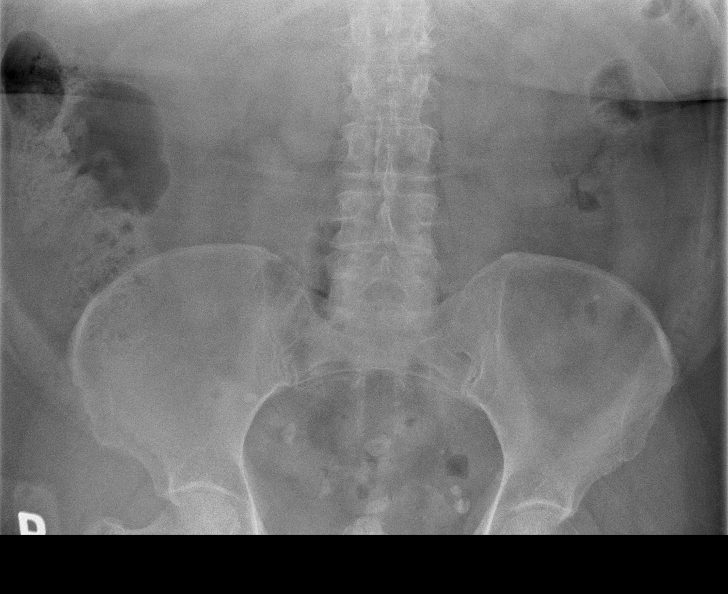

[Series 3: cp_standard · 0.51mm/px · 1 of 27 frames shown (1 of 10)]
[frame 14/27]
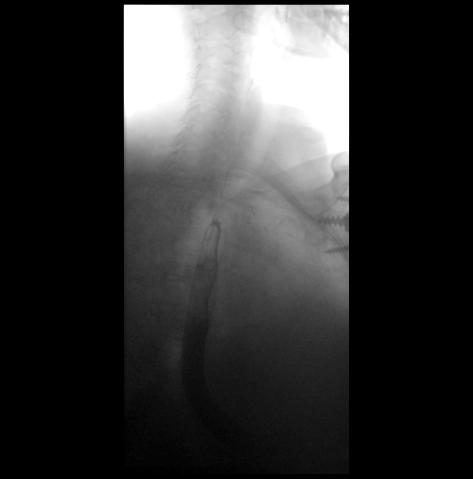

[Series 4: cp_standard · 0.51mm/px · 2 of 23 frames shown (2 of 10)]
[frame 1/23]
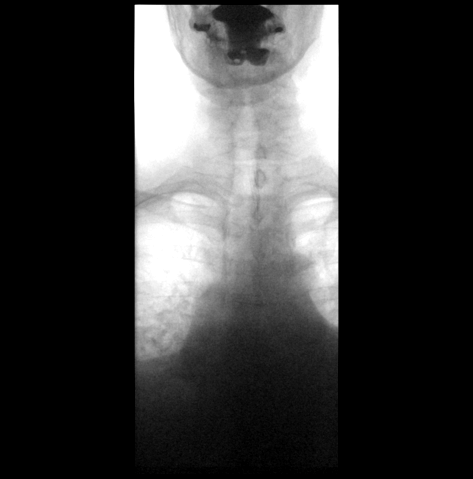
[frame 20/23]
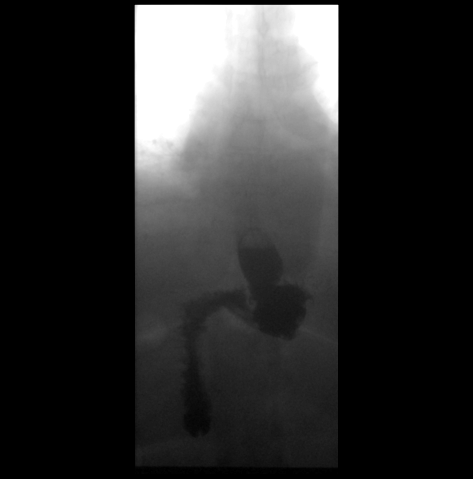

[Series 6: cp_standard · 0.17mm/px · 1 of 1 slices shown (3 of 10)]
[im 1/1]
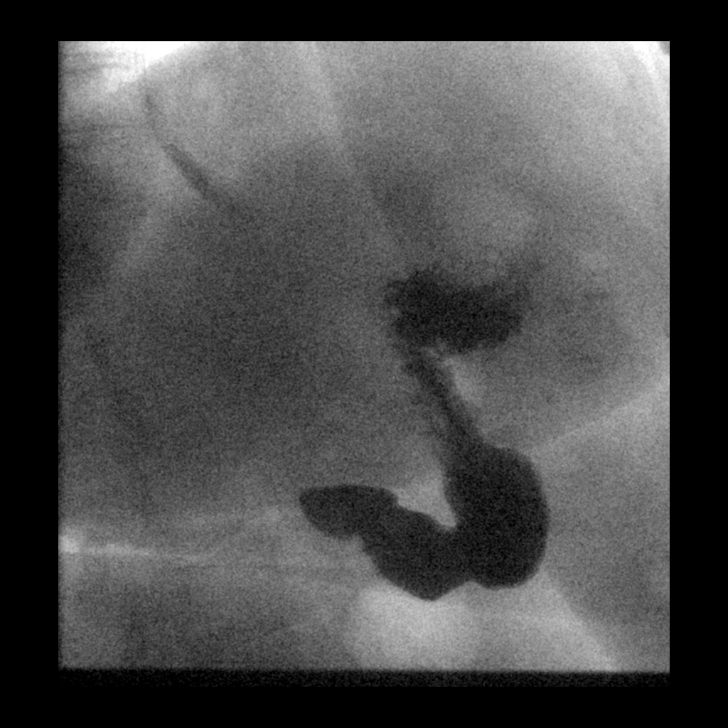

[Series 9: cp_standard · 0.17mm/px · 1 of 1 slices shown (4 of 10)]
[im 1/1]
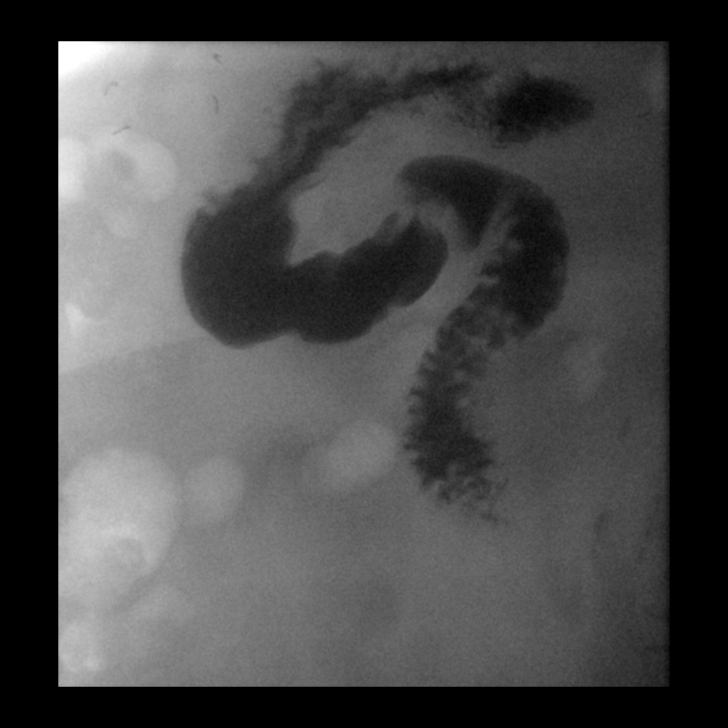

[Series 11: cp_standard · 0.17mm/px · 1 of 1 slices shown (5 of 10)]
[im 1/1]
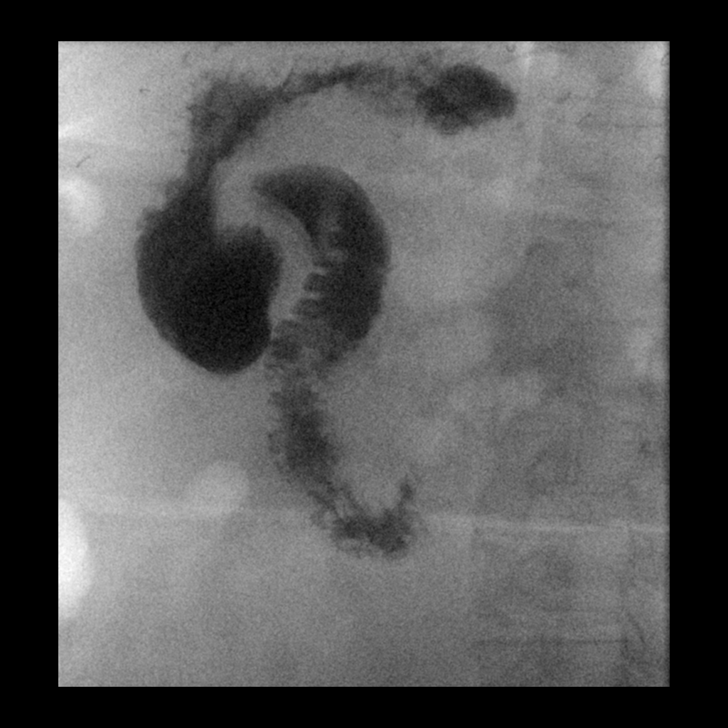

[Series 14: cp_standard · 0.17mm/px · 1 of 1 slices shown (6 of 10)]
[im 1/1]
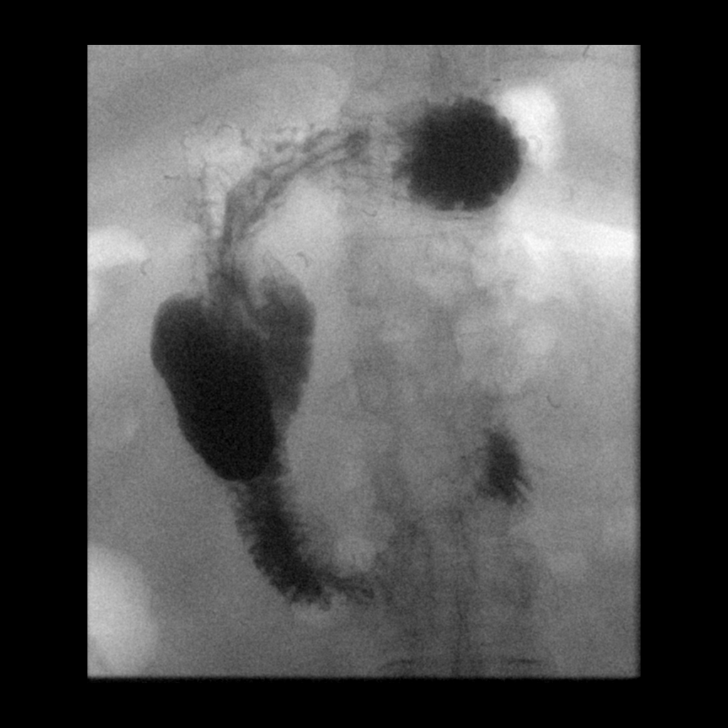

[Series 16: cp_standard · 0.17mm/px · 1 of 1 slices shown (7 of 10)]
[im 1/1]
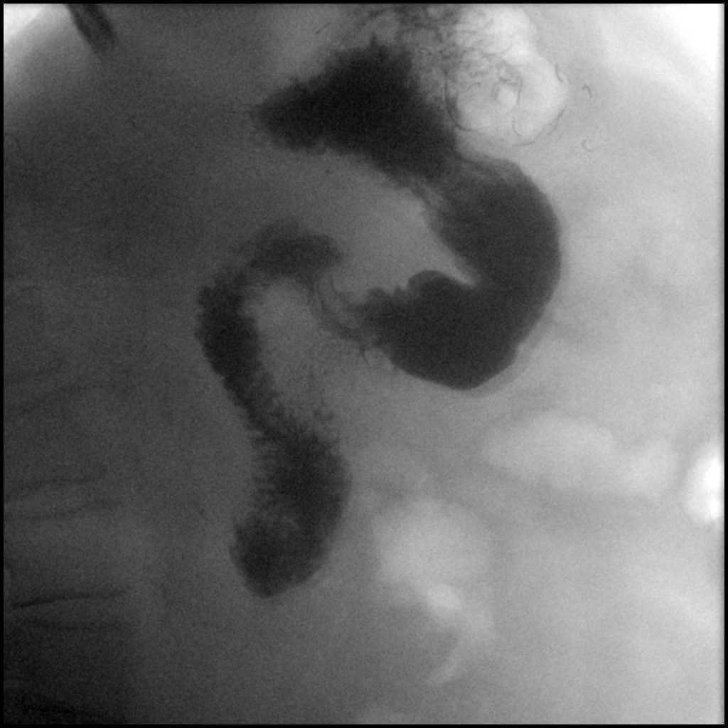

[Series 19: cp_standard · 0.17mm/px · 1 of 1 slices shown (8 of 10)]
[im 1/1]
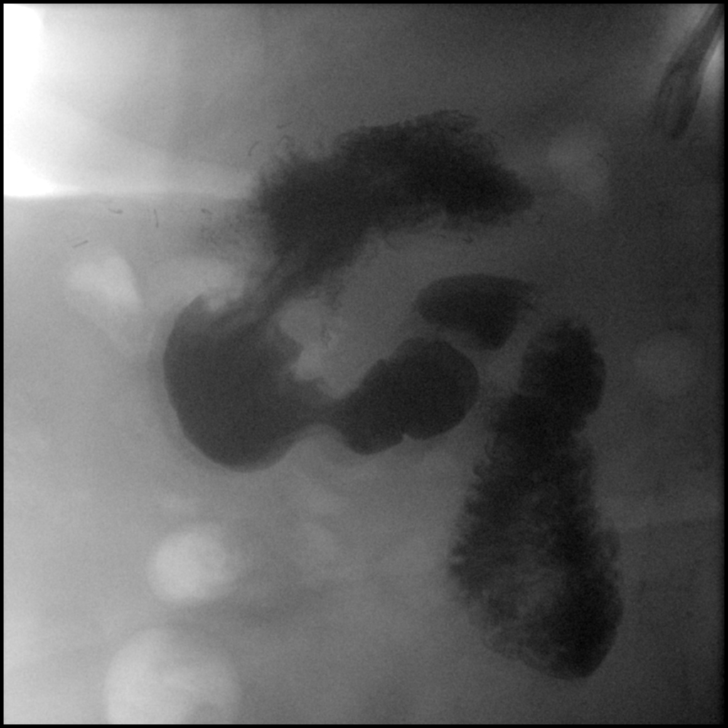

[Series 20: cp_standard · 0.52mm/px · 1 of 51 frames shown (9 of 10)]
[frame 26/51]
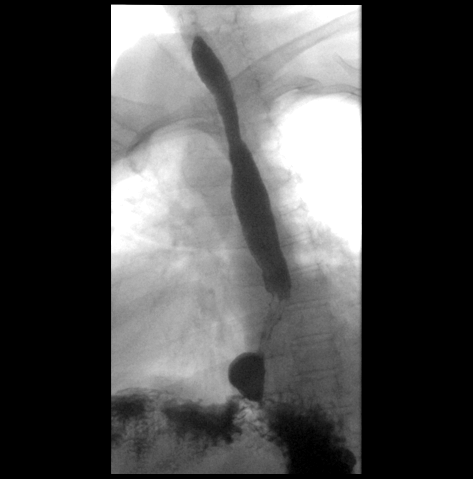

[Series 21: cp_standard · 0.26mm/px · 1 of 1 slices shown (10 of 10)]
[im 1/1]
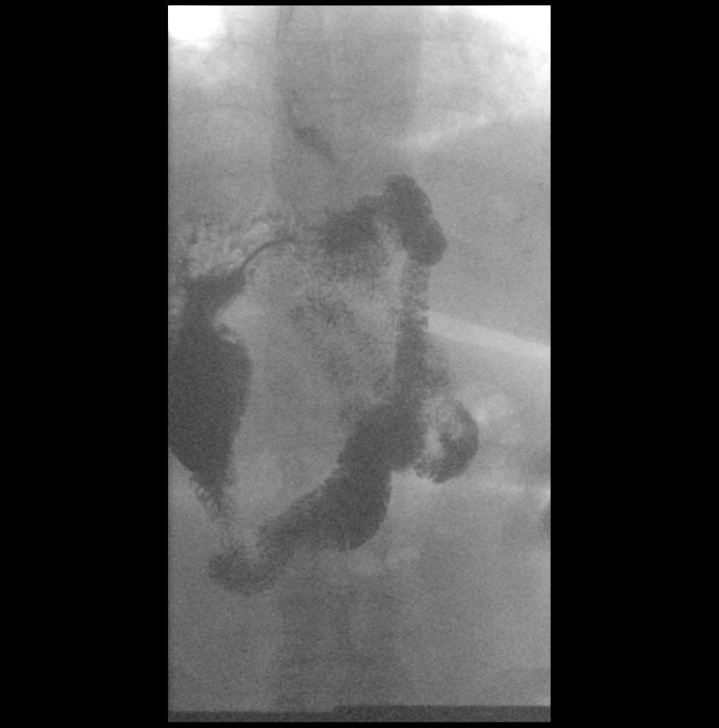

[12 of 24 positions shown; findings below may reference images not displayed]

FINDINGS: Sips of thin barium were utilized. Contrast flowed readily from the
esophagus into the ?excluded? stomach and duodenum.

Contrast progressed over the course of the exam into the jejunum,
ligament of Treitz to the left of midline.

Contrast only filled the Roux limb later in the examination after
positioning the patient into prone LAO position at allowing gravity
to fill this surgical anatomy.

Initial assessment of the esophagus suggested there was some mild
narrowing distally just above the pouch. This was not however found
to be the case following ingestion of a barium tablet which showed
prompt passage into the excluded stomach.

Esophageal motility was assessed in a limited fashion showing
proximal escape of 20-30% of the ingested material into the proximal
esophagus during swallowing and signs of moderate tertiary
peristaltic activity

Some mild irregularity of the mucosa was present suggesting some
ulceration particularly in the gastric antrum. Stomach was not well
distended for this evaluation.
IMPRESSION: 1. Signs of gastrogastric fistula.
2. Preferential filling of the "excluded stomach" during the
examination.
3. Possible mild ulceration in the area of the gastric antrum though
mucosal assessment was limited.
4. Flow into the Roux limb of ingested contrast only with prone LAO
positioning. i.e. with gravity assistance.
5. Esophageal dysmotility.

.
